# Patient Record
Sex: Male | Born: 1955 | Race: Black or African American | Hispanic: No | Marital: Married | Smoking: Never smoker
Health system: Southern US, Community
[De-identification: ages and names within clinical notes are randomized; demographics above are authoritative.]

## PROBLEM LIST (undated history)

## (undated) ENCOUNTER — Emergency Department (HOSPITAL_COMMUNITY): Admission: EM | Payer: Medicare PPO | Source: Home / Self Care

## (undated) DIAGNOSIS — E559 Vitamin D deficiency, unspecified: Secondary | ICD-10-CM

## (undated) DIAGNOSIS — Z8521 Personal history of malignant neoplasm of larynx: Secondary | ICD-10-CM

## (undated) DIAGNOSIS — E782 Mixed hyperlipidemia: Secondary | ICD-10-CM

## (undated) DIAGNOSIS — R072 Precordial pain: Secondary | ICD-10-CM

## (undated) DIAGNOSIS — K219 Gastro-esophageal reflux disease without esophagitis: Secondary | ICD-10-CM

## (undated) DIAGNOSIS — I2510 Atherosclerotic heart disease of native coronary artery without angina pectoris: Secondary | ICD-10-CM

## (undated) DIAGNOSIS — E034 Atrophy of thyroid (acquired): Secondary | ICD-10-CM

## (undated) DIAGNOSIS — R0602 Shortness of breath: Secondary | ICD-10-CM

## (undated) DIAGNOSIS — E291 Testicular hypofunction: Secondary | ICD-10-CM

## (undated) DIAGNOSIS — Z8501 Personal history of malignant neoplasm of esophagus: Secondary | ICD-10-CM

## (undated) DIAGNOSIS — B1920 Unspecified viral hepatitis C without hepatic coma: Secondary | ICD-10-CM

## (undated) DIAGNOSIS — L910 Hypertrophic scar: Secondary | ICD-10-CM

## (undated) DIAGNOSIS — C153 Malignant neoplasm of upper third of esophagus: Secondary | ICD-10-CM

## (undated) HISTORY — DX: Atrophy of thyroid (acquired): E03.4

## (undated) HISTORY — DX: Atherosclerotic heart disease of native coronary artery without angina pectoris: I25.10

## (undated) HISTORY — DX: Hypertrophic scar: L91.0

## (undated) HISTORY — DX: Testicular hypofunction: E29.1

## (undated) HISTORY — DX: Vitamin D deficiency, unspecified: E55.9

## (undated) HISTORY — DX: Personal history of malignant neoplasm of larynx: Z85.21

## (undated) HISTORY — DX: Mixed hyperlipidemia: E78.2

## (undated) HISTORY — PX: THROAT SURGERY: SHX803

## (undated) HISTORY — DX: Gastro-esophageal reflux disease without esophagitis: K21.9

## (undated) HISTORY — DX: Malignant neoplasm of upper third of esophagus: C15.3

## (undated) HISTORY — PX: PORTACATH PLACEMENT: SHX2246

## (undated) HISTORY — DX: Personal history of malignant neoplasm of esophagus: Z85.01

## (undated) HISTORY — DX: Unspecified viral hepatitis C without hepatic coma: B19.20

## (undated) HISTORY — PX: OTHER SURGICAL HISTORY: SHX169

## (undated) HISTORY — DX: Precordial pain: R07.2

## (undated) HISTORY — DX: Shortness of breath: R06.02

## (undated) HISTORY — PX: GASTROSTOMY W/ FEEDING TUBE: SUR642

---

## 2003-09-30 ENCOUNTER — Emergency Department (HOSPITAL_COMMUNITY): Admission: EM | Admit: 2003-09-30 | Discharge: 2003-09-30 | Payer: Self-pay | Admitting: Family Medicine

## 2003-10-03 ENCOUNTER — Emergency Department (HOSPITAL_COMMUNITY): Admission: EM | Admit: 2003-10-03 | Discharge: 2003-10-03 | Payer: Self-pay | Admitting: Emergency Medicine

## 2003-11-01 ENCOUNTER — Ambulatory Visit: Payer: Self-pay | Admitting: Family Medicine

## 2003-11-05 ENCOUNTER — Encounter: Admission: RE | Admit: 2003-11-05 | Discharge: 2003-11-05 | Payer: Self-pay | Admitting: Family Medicine

## 2003-11-05 ENCOUNTER — Other Ambulatory Visit: Admission: RE | Admit: 2003-11-05 | Discharge: 2003-11-05 | Payer: Self-pay | Admitting: Radiology

## 2005-08-31 ENCOUNTER — Ambulatory Visit (HOSPITAL_COMMUNITY): Admission: RE | Admit: 2005-08-31 | Discharge: 2005-08-31 | Payer: Self-pay | Admitting: General Surgery

## 2012-12-06 DIAGNOSIS — C153 Malignant neoplasm of upper third of esophagus: Secondary | ICD-10-CM

## 2012-12-06 HISTORY — DX: Malignant neoplasm of upper third of esophagus: C15.3

## 2015-06-09 DIAGNOSIS — E291 Testicular hypofunction: Secondary | ICD-10-CM | POA: Diagnosis not present

## 2015-06-09 DIAGNOSIS — E034 Atrophy of thyroid (acquired): Secondary | ICD-10-CM | POA: Diagnosis not present

## 2015-07-24 DIAGNOSIS — J018 Other acute sinusitis: Secondary | ICD-10-CM | POA: Diagnosis not present

## 2015-07-30 DIAGNOSIS — Z8501 Personal history of malignant neoplasm of esophagus: Secondary | ICD-10-CM | POA: Diagnosis not present

## 2015-07-30 DIAGNOSIS — C153 Malignant neoplasm of upper third of esophagus: Secondary | ICD-10-CM | POA: Diagnosis not present

## 2015-07-30 DIAGNOSIS — E079 Disorder of thyroid, unspecified: Secondary | ICD-10-CM | POA: Diagnosis not present

## 2015-08-07 DIAGNOSIS — C153 Malignant neoplasm of upper third of esophagus: Secondary | ICD-10-CM | POA: Diagnosis not present

## 2015-08-20 DIAGNOSIS — C153 Malignant neoplasm of upper third of esophagus: Secondary | ICD-10-CM | POA: Diagnosis not present

## 2015-08-20 DIAGNOSIS — R633 Feeding difficulties: Secondary | ICD-10-CM | POA: Diagnosis not present

## 2015-08-20 DIAGNOSIS — R1310 Dysphagia, unspecified: Secondary | ICD-10-CM | POA: Diagnosis not present

## 2015-08-26 DIAGNOSIS — E039 Hypothyroidism, unspecified: Secondary | ICD-10-CM | POA: Diagnosis not present

## 2015-08-26 DIAGNOSIS — Z431 Encounter for attention to gastrostomy: Secondary | ICD-10-CM | POA: Diagnosis not present

## 2015-08-26 DIAGNOSIS — R1310 Dysphagia, unspecified: Secondary | ICD-10-CM | POA: Diagnosis not present

## 2015-08-26 DIAGNOSIS — K222 Esophageal obstruction: Secondary | ICD-10-CM | POA: Diagnosis not present

## 2015-08-26 DIAGNOSIS — Z8501 Personal history of malignant neoplasm of esophagus: Secondary | ICD-10-CM | POA: Diagnosis not present

## 2015-08-26 DIAGNOSIS — C159 Malignant neoplasm of esophagus, unspecified: Secondary | ICD-10-CM | POA: Diagnosis not present

## 2015-08-26 DIAGNOSIS — Z79899 Other long term (current) drug therapy: Secondary | ICD-10-CM | POA: Diagnosis not present

## 2015-10-23 DIAGNOSIS — R42 Dizziness and giddiness: Secondary | ICD-10-CM | POA: Diagnosis not present

## 2015-10-29 DIAGNOSIS — R0602 Shortness of breath: Secondary | ICD-10-CM | POA: Diagnosis not present

## 2015-10-29 DIAGNOSIS — R42 Dizziness and giddiness: Secondary | ICD-10-CM | POA: Diagnosis not present

## 2015-10-29 DIAGNOSIS — Z8501 Personal history of malignant neoplasm of esophagus: Secondary | ICD-10-CM | POA: Diagnosis not present

## 2015-11-19 DIAGNOSIS — M545 Low back pain: Secondary | ICD-10-CM | POA: Diagnosis not present

## 2015-11-19 DIAGNOSIS — E039 Hypothyroidism, unspecified: Secondary | ICD-10-CM | POA: Diagnosis not present

## 2015-11-19 DIAGNOSIS — E669 Obesity, unspecified: Secondary | ICD-10-CM | POA: Diagnosis not present

## 2015-11-19 DIAGNOSIS — Z6829 Body mass index (BMI) 29.0-29.9, adult: Secondary | ICD-10-CM | POA: Diagnosis not present

## 2015-11-20 DIAGNOSIS — R0602 Shortness of breath: Secondary | ICD-10-CM | POA: Diagnosis not present

## 2015-11-20 DIAGNOSIS — Z8501 Personal history of malignant neoplasm of esophagus: Secondary | ICD-10-CM | POA: Diagnosis not present

## 2015-11-20 DIAGNOSIS — R0609 Other forms of dyspnea: Secondary | ICD-10-CM | POA: Diagnosis not present

## 2015-12-26 DIAGNOSIS — R42 Dizziness and giddiness: Secondary | ICD-10-CM | POA: Diagnosis not present

## 2015-12-26 DIAGNOSIS — Z8521 Personal history of malignant neoplasm of larynx: Secondary | ICD-10-CM | POA: Insufficient documentation

## 2015-12-26 DIAGNOSIS — R0602 Shortness of breath: Secondary | ICD-10-CM | POA: Diagnosis not present

## 2015-12-26 DIAGNOSIS — R0600 Dyspnea, unspecified: Secondary | ICD-10-CM | POA: Insufficient documentation

## 2015-12-26 DIAGNOSIS — R072 Precordial pain: Secondary | ICD-10-CM | POA: Diagnosis not present

## 2015-12-26 DIAGNOSIS — R0609 Other forms of dyspnea: Secondary | ICD-10-CM | POA: Insufficient documentation

## 2015-12-26 HISTORY — DX: Personal history of malignant neoplasm of larynx: Z85.21

## 2015-12-26 HISTORY — DX: Shortness of breath: R06.02

## 2015-12-26 HISTORY — DX: Precordial pain: R07.2

## 2016-01-15 DIAGNOSIS — Z8521 Personal history of malignant neoplasm of larynx: Secondary | ICD-10-CM | POA: Diagnosis not present

## 2016-01-15 DIAGNOSIS — R072 Precordial pain: Secondary | ICD-10-CM | POA: Diagnosis not present

## 2016-01-15 DIAGNOSIS — R0602 Shortness of breath: Secondary | ICD-10-CM | POA: Diagnosis not present

## 2016-02-05 DIAGNOSIS — Z8501 Personal history of malignant neoplasm of esophagus: Secondary | ICD-10-CM | POA: Diagnosis not present

## 2016-02-05 DIAGNOSIS — J9811 Atelectasis: Secondary | ICD-10-CM | POA: Diagnosis not present

## 2016-02-05 DIAGNOSIS — C153 Malignant neoplasm of upper third of esophagus: Secondary | ICD-10-CM | POA: Diagnosis not present

## 2016-02-05 DIAGNOSIS — R05 Cough: Secondary | ICD-10-CM | POA: Diagnosis not present

## 2016-03-02 DIAGNOSIS — R0602 Shortness of breath: Secondary | ICD-10-CM | POA: Diagnosis not present

## 2016-03-02 DIAGNOSIS — R072 Precordial pain: Secondary | ICD-10-CM | POA: Diagnosis not present

## 2016-03-02 DIAGNOSIS — Z8521 Personal history of malignant neoplasm of larynx: Secondary | ICD-10-CM | POA: Diagnosis not present

## 2016-03-10 DIAGNOSIS — Z0000 Encounter for general adult medical examination without abnormal findings: Secondary | ICD-10-CM

## 2016-03-10 DIAGNOSIS — Z452 Encounter for adjustment and management of vascular access device: Secondary | ICD-10-CM | POA: Diagnosis not present

## 2016-03-10 HISTORY — DX: Encounter for adjustment and management of vascular access device: Z45.2

## 2016-03-10 HISTORY — DX: Encounter for general adult medical examination without abnormal findings: Z00.00

## 2016-04-28 DIAGNOSIS — Z452 Encounter for adjustment and management of vascular access device: Secondary | ICD-10-CM | POA: Diagnosis not present

## 2016-05-03 DIAGNOSIS — K219 Gastro-esophageal reflux disease without esophagitis: Secondary | ICD-10-CM | POA: Diagnosis not present

## 2016-05-03 DIAGNOSIS — Z8501 Personal history of malignant neoplasm of esophagus: Secondary | ICD-10-CM | POA: Diagnosis not present

## 2016-05-03 DIAGNOSIS — Z79899 Other long term (current) drug therapy: Secondary | ICD-10-CM | POA: Diagnosis not present

## 2016-05-03 DIAGNOSIS — Z452 Encounter for adjustment and management of vascular access device: Secondary | ICD-10-CM | POA: Diagnosis not present

## 2016-05-03 DIAGNOSIS — E039 Hypothyroidism, unspecified: Secondary | ICD-10-CM | POA: Diagnosis not present

## 2016-05-12 DIAGNOSIS — R05 Cough: Secondary | ICD-10-CM | POA: Diagnosis not present

## 2016-05-12 DIAGNOSIS — E663 Overweight: Secondary | ICD-10-CM | POA: Diagnosis not present

## 2016-05-12 DIAGNOSIS — Z7982 Long term (current) use of aspirin: Secondary | ICD-10-CM | POA: Diagnosis not present

## 2016-05-12 DIAGNOSIS — I209 Angina pectoris, unspecified: Secondary | ICD-10-CM | POA: Diagnosis not present

## 2016-05-12 DIAGNOSIS — M545 Low back pain: Secondary | ICD-10-CM | POA: Diagnosis not present

## 2016-05-12 DIAGNOSIS — E039 Hypothyroidism, unspecified: Secondary | ICD-10-CM | POA: Diagnosis not present

## 2016-05-12 DIAGNOSIS — Z8501 Personal history of malignant neoplasm of esophagus: Secondary | ICD-10-CM | POA: Diagnosis not present

## 2016-05-12 DIAGNOSIS — E785 Hyperlipidemia, unspecified: Secondary | ICD-10-CM | POA: Diagnosis not present

## 2016-05-12 DIAGNOSIS — Z6828 Body mass index (BMI) 28.0-28.9, adult: Secondary | ICD-10-CM | POA: Diagnosis not present

## 2016-05-20 DIAGNOSIS — R350 Frequency of micturition: Secondary | ICD-10-CM | POA: Diagnosis not present

## 2016-05-20 DIAGNOSIS — C159 Malignant neoplasm of esophagus, unspecified: Secondary | ICD-10-CM | POA: Diagnosis not present

## 2016-05-20 DIAGNOSIS — E559 Vitamin D deficiency, unspecified: Secondary | ICD-10-CM | POA: Diagnosis not present

## 2016-05-20 DIAGNOSIS — E034 Atrophy of thyroid (acquired): Secondary | ICD-10-CM | POA: Diagnosis not present

## 2016-05-26 DIAGNOSIS — Z452 Encounter for adjustment and management of vascular access device: Secondary | ICD-10-CM | POA: Diagnosis not present

## 2016-06-28 DIAGNOSIS — C159 Malignant neoplasm of esophagus, unspecified: Secondary | ICD-10-CM | POA: Diagnosis not present

## 2016-06-28 DIAGNOSIS — R1310 Dysphagia, unspecified: Secondary | ICD-10-CM | POA: Diagnosis not present

## 2016-06-28 DIAGNOSIS — K222 Esophageal obstruction: Secondary | ICD-10-CM | POA: Diagnosis not present

## 2016-06-30 DIAGNOSIS — R072 Precordial pain: Secondary | ICD-10-CM | POA: Diagnosis not present

## 2016-06-30 DIAGNOSIS — R0602 Shortness of breath: Secondary | ICD-10-CM | POA: Diagnosis not present

## 2016-06-30 DIAGNOSIS — Z8521 Personal history of malignant neoplasm of larynx: Secondary | ICD-10-CM | POA: Diagnosis not present

## 2016-07-02 DIAGNOSIS — Z7982 Long term (current) use of aspirin: Secondary | ICD-10-CM | POA: Diagnosis not present

## 2016-07-02 DIAGNOSIS — R1310 Dysphagia, unspecified: Secondary | ICD-10-CM | POA: Diagnosis not present

## 2016-07-02 DIAGNOSIS — Z8501 Personal history of malignant neoplasm of esophagus: Secondary | ICD-10-CM | POA: Diagnosis not present

## 2016-07-02 DIAGNOSIS — Z86010 Personal history of colonic polyps: Secondary | ICD-10-CM | POA: Diagnosis not present

## 2016-07-02 DIAGNOSIS — E079 Disorder of thyroid, unspecified: Secondary | ICD-10-CM | POA: Diagnosis not present

## 2016-07-02 DIAGNOSIS — C159 Malignant neoplasm of esophagus, unspecified: Secondary | ICD-10-CM | POA: Diagnosis not present

## 2016-07-02 DIAGNOSIS — Z79899 Other long term (current) drug therapy: Secondary | ICD-10-CM | POA: Diagnosis not present

## 2016-08-05 DIAGNOSIS — C153 Malignant neoplasm of upper third of esophagus: Secondary | ICD-10-CM | POA: Diagnosis not present

## 2016-08-05 DIAGNOSIS — Z8501 Personal history of malignant neoplasm of esophagus: Secondary | ICD-10-CM | POA: Diagnosis not present

## 2016-09-29 DIAGNOSIS — L910 Hypertrophic scar: Secondary | ICD-10-CM | POA: Diagnosis not present

## 2016-09-29 HISTORY — DX: Hypertrophic scar: L91.0

## 2016-10-26 DIAGNOSIS — L732 Hidradenitis suppurativa: Secondary | ICD-10-CM | POA: Diagnosis not present

## 2016-11-24 DIAGNOSIS — K219 Gastro-esophageal reflux disease without esophagitis: Secondary | ICD-10-CM | POA: Diagnosis not present

## 2016-11-24 DIAGNOSIS — Z23 Encounter for immunization: Secondary | ICD-10-CM | POA: Diagnosis not present

## 2016-11-24 DIAGNOSIS — E034 Atrophy of thyroid (acquired): Secondary | ICD-10-CM | POA: Diagnosis not present

## 2016-11-24 DIAGNOSIS — E291 Testicular hypofunction: Secondary | ICD-10-CM | POA: Diagnosis not present

## 2016-12-22 DIAGNOSIS — I10 Essential (primary) hypertension: Secondary | ICD-10-CM | POA: Diagnosis not present

## 2017-04-11 ENCOUNTER — Encounter: Payer: Self-pay | Admitting: *Deleted

## 2017-04-11 ENCOUNTER — Other Ambulatory Visit: Payer: Self-pay | Admitting: *Deleted

## 2017-04-15 DIAGNOSIS — D649 Anemia, unspecified: Secondary | ICD-10-CM | POA: Diagnosis not present

## 2017-04-15 DIAGNOSIS — Z8501 Personal history of malignant neoplasm of esophagus: Secondary | ICD-10-CM | POA: Diagnosis not present

## 2017-04-15 DIAGNOSIS — Z923 Personal history of irradiation: Secondary | ICD-10-CM | POA: Diagnosis not present

## 2017-04-15 DIAGNOSIS — Z9221 Personal history of antineoplastic chemotherapy: Secondary | ICD-10-CM | POA: Diagnosis not present

## 2017-04-20 ENCOUNTER — Encounter: Payer: Self-pay | Admitting: Cardiology

## 2017-04-20 ENCOUNTER — Ambulatory Visit: Payer: Medicare PPO | Admitting: Cardiology

## 2017-04-20 VITALS — BP 150/80 | HR 72 | Resp 12 | Ht 71.0 in | Wt 222.0 lb

## 2017-04-20 DIAGNOSIS — R072 Precordial pain: Secondary | ICD-10-CM

## 2017-04-20 DIAGNOSIS — R0602 Shortness of breath: Secondary | ICD-10-CM | POA: Diagnosis not present

## 2017-04-20 DIAGNOSIS — Z8521 Personal history of malignant neoplasm of larynx: Secondary | ICD-10-CM | POA: Diagnosis not present

## 2017-04-20 NOTE — Progress Notes (Signed)
Cardiology Office Note:    Date:  04/20/2017   ID:  Mark Mejia, DOB 11-13-1955, MRN [id number]  PCP:  Darrol Jump, PA-C  Cardiologist:  Jenne Campus, MD    Referring MD: Darrol Jump, PA-C   Chief Complaint  Patient presents with  . Follow-up  Having neck pain  History of Present Illness:    Mark Mejia is a 62 y.o. male with history of exertional chest pain.  Stress test was done which was normal however I put him on antianginal therapy and he seems to responding very well to it.  Denies having any chest pain tightness squeezing pressure burning chest since the beginning he did have quite a dramatic response to medications however now we have new problem he complained of having some numbness in the right side of his head and many times he complained of having tightness around his jaw.  That typically last for a few seconds not related to exercise.  There is no shortness of breath associated with this there is no chest pain associated with it.  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  He was told that he may have a problem with carotid artery and I think it would be reasonable to check a carotid artery with carotid ultrasound.  Described to have some exertional shortness of breath but nothing extraordinary I still think it would be reasonable to perform echocardiogram to check his left ventricular ejection fraction.  Past Medical History:  Diagnosis Date  . 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 Hypertrophic scar of skin 09/29/2016  . Precordial chest pain 12/26/2015  . Shortness of breath 12/26/2015      Current Medications: Current Meds  Medication Sig  . amLODipine (NORVASC) 5 MG tablet Take 1 tablet by mouth daily.  Marland Kitchen aspirin EC 81 MG tablet Take 81 mg by mouth daily.   Marland Kitchen atorvastatin (LIPITOR) 10 MG tablet Take 10 mg by mouth daily at 6 PM.   . cyclobenzaprine (FLEXERIL) 10 MG  tablet Take 10 mg by mouth 3 (three) times daily as needed.   . finasteride (PROSCAR) 5 MG tablet Take 1 tablet by mouth daily.  Marland Kitchen HYDROcodone-acetaminophen (NORCO/VICODIN) 5-325 MG tablet Take 1 tablet by mouth every 8 (eight) hours as needed.  Marland Kitchen levothyroxine (SYNTHROID, LEVOTHROID) 50 MCG tablet Take 1 tablet by mouth daily.  . meloxicam (MOBIC) 7.5 MG tablet Take 1 tablet by mouth daily.  . nitroGLYCERIN (NITROSTAT) 0.4 MG SL tablet Place 1 tablet under the tongue as needed.  . ranolazine (RANEXA) 1000 MG SR tablet Take 500 mg by mouth 2 (two) times daily.  . Vitamin D, Ergocalciferol, (DRISDOL) 50000 units CAPS capsule Take 1 capsule by mouth once a week.     Allergies:   Patient has no known allergies.   Social History   Socioeconomic History  . Marital status: Married    Spouse name: None  . Number of children: None  . Years of education: None  . Highest education level: None  Social Needs  . Financial resource strain: None  . Food insecurity - worry: None  . Food insecurity - inability: None  . Transportation needs - medical: None  . Transportation needs - non-medical: None  Occupational History  . None  Tobacco Use  . Smoking status: Former Research scientist (life sciences)  . Smokeless tobacco: Never Used  Substance and Sexual Activity  . Alcohol use: No    Frequency:  Never  . Drug use: No  . Sexual activity: None  Other Topics Concern  . None  Social History Narrative  . None     Family History: The patient's family history includes Arthritis in his mother; Hypertension in his mother. ROS:   Please see the history of present illness.    All 14 point review of systems negative except as described per history of present illness  EKGs/Labs/Other Studies Reviewed:      Recent Labs: No results found for requested labs within last 8760 hours.  Recent Lipid Panel No results found for: CHOL, TRIG, HDL, CHOLHDL, VLDL, LDLCALC, LDLDIRECT  Physical Exam:    VS:  BP (!) 150/80   Pulse  72   Resp 12   Ht 5\' 11"  (1.803 m)   Wt 222 lb (100.7 kg)   BMI 30.96 kg/m     Wt Readings from Last 3 Encounters:  04/20/17 222 lb (100.7 kg)     GEN:  Well nourished, well developed in no acute distress HEENT: Normal NECK: No JVD; No carotid bruits LYMPHATICS: No lymphadenopathy CARDIAC: RRR, no murmurs, no rubs, no gallops RESPIRATORY:  Clear to auscultation without rales, wheezing or rhonchi  ABDOMEN: Soft, non-tender, non-distended MUSCULOSKELETAL:  No edema; No deformity  SKIN: Warm and dry LOWER EXTREMITIES: no swelling NEUROLOGIC:  Alert and oriented x 3 PSYCHIATRIC:  Normal affect   ASSESSMENT:    1. History of laryngeal cancer   2. Precordial chest pain   3. Shortness of breath    PLAN:    In order of problems listed above:  1. Precordial chest pain.  Well controlled with medications.  Now she he does have neck pain.  However atypical not related to exercise lasting only for seconds.  I will ask him to have carotid ultrasound.  Echocardiogram will be done as well. 2. History of laryngeal cancer: Followed by oncology team. 3. Shortness of breath: Multifactorial we will get echocardiogram.   Medication Adjustments/Labs and Tests Ordered: Current medicines are reviewed at length with the patient today.  Concerns regarding medicines are outlined above.  No orders of the defined types were placed in this encounter.  Medication changes: No orders of the defined types were placed in this encounter.   Signed, Park Liter, MD, [facility] 04/20/2017 10:23 AM    [place]

## 2017-04-20 NOTE — Patient Instructions (Signed)
Medication Instructions:  Your physician recommends that you continue on your current medications as directed. Please refer to the Current Medication list given to you today.  Labwork: None ordered  Testing/Procedures: Your physician has requested that you have an echocardiogram. Echocardiography is a painless test that uses sound waves to create images of your heart. It provides your doctor with information about the size and shape of your heart and how well your heart's chambers and valves are working. This procedure takes approximately one hour. There are no restrictions for this procedure.  Your physician has requested that you have a carotid duplex. This test is an ultrasound of the carotid arteries in your neck. It looks at blood flow through these arteries that supply the brain with blood. Allow one hour for this exam. There are no restrictions or special instructions.  EKG Today  Follow-Up: Your physician recommends that you schedule a follow-up appointment in: 1 month with Dr. Agustin Cree   Any Other Special Instructions Will Be Listed Below (If Applicable).     If you need a refill on your cardiac medications before your next appointment, please call your pharmacy.

## 2017-05-19 ENCOUNTER — Ambulatory Visit (HOSPITAL_BASED_OUTPATIENT_CLINIC_OR_DEPARTMENT_OTHER)
Admission: RE | Admit: 2017-05-19 | Discharge: 2017-05-19 | Disposition: A | Discharge status: Home / Self Care | Payer: Medicare PPO | Source: Ambulatory Visit | Attending: Cardiology | Admitting: Cardiology

## 2017-05-19 DIAGNOSIS — I517 Cardiomegaly: Secondary | ICD-10-CM | POA: Diagnosis not present

## 2017-05-19 DIAGNOSIS — Z923 Personal history of irradiation: Secondary | ICD-10-CM | POA: Insufficient documentation

## 2017-05-19 DIAGNOSIS — I340 Nonrheumatic mitral (valve) insufficiency: Secondary | ICD-10-CM | POA: Diagnosis not present

## 2017-05-19 DIAGNOSIS — R0602 Shortness of breath: Secondary | ICD-10-CM

## 2017-05-19 DIAGNOSIS — Z8521 Personal history of malignant neoplasm of larynx: Secondary | ICD-10-CM | POA: Insufficient documentation

## 2017-05-19 DIAGNOSIS — R072 Precordial pain: Secondary | ICD-10-CM | POA: Diagnosis not present

## 2017-05-19 NOTE — Progress Notes (Signed)
*  PRELIMINARY RESULTS*  Complete Carotid duplex has been performed, Bilateral ICA stenosis was observed.  Joelene Millin 05/19/2017, 12:28 PM

## 2017-05-19 NOTE — Progress Notes (Signed)
Echocardiogram 2D Echocardiogram has been performed.  Mark Mejia 05/19/2017, 12:31 PM

## 2017-05-23 ENCOUNTER — Encounter: Payer: Self-pay | Admitting: Cardiology

## 2017-05-23 ENCOUNTER — Ambulatory Visit (HOSPITAL_BASED_OUTPATIENT_CLINIC_OR_DEPARTMENT_OTHER)
Admission: RE | Admit: 2017-05-23 | Discharge: 2017-05-23 | Disposition: A | Discharge status: Home / Self Care | Payer: Medicare PPO | Source: Ambulatory Visit | Attending: Cardiology | Admitting: Cardiology

## 2017-05-23 ENCOUNTER — Ambulatory Visit: Payer: Medicare PPO | Admitting: Cardiology

## 2017-05-23 VITALS — BP 134/70 | HR 68 | Ht 71.0 in | Wt 219.8 lb

## 2017-05-23 DIAGNOSIS — R072 Precordial pain: Secondary | ICD-10-CM | POA: Diagnosis not present

## 2017-05-23 DIAGNOSIS — R0602 Shortness of breath: Secondary | ICD-10-CM | POA: Insufficient documentation

## 2017-05-23 DIAGNOSIS — Z8521 Personal history of malignant neoplasm of larynx: Secondary | ICD-10-CM | POA: Diagnosis not present

## 2017-05-23 DIAGNOSIS — Z01818 Encounter for other preprocedural examination: Secondary | ICD-10-CM | POA: Insufficient documentation

## 2017-05-23 DIAGNOSIS — R079 Chest pain, unspecified: Secondary | ICD-10-CM | POA: Diagnosis not present

## 2017-05-23 DIAGNOSIS — I739 Peripheral vascular disease, unspecified: Secondary | ICD-10-CM | POA: Insufficient documentation

## 2017-05-23 DIAGNOSIS — R109 Unspecified abdominal pain: Secondary | ICD-10-CM | POA: Diagnosis present

## 2017-05-23 HISTORY — DX: Peripheral vascular disease, unspecified: I73.9

## 2017-05-23 MED ORDER — ROSUVASTATIN CALCIUM 20 MG PO TABS: 20.0000 mg | ORAL_TABLET | Freq: Every day | ORAL | 3 refills | Status: DC

## 2017-05-23 MED ORDER — OMEPRAZOLE 40 MG PO CPDR: 40.0000 mg | DELAYED_RELEASE_CAPSULE | Freq: Every day | ORAL | 0 refills | Status: DC

## 2017-05-23 NOTE — H&P (View-Only) (Signed)
Cardiology Office Note:    Date:  05/23/2017   ID:  Mark Mejia, DOB 15-Nov-1955, MRN [id number]  PCP:  Darrol Jump, PA-C  Cardiologist:  Jenne Campus, MD    Referring MD: Darrol Jump, PA-C   Chief Complaint  Patient presents with  . 1 month follow up  I am having a lot of heartburn  History of Present Illness:    Mark Mejia is a 62 y.o. male with history of laryngeal cancer.  He also got history of chest pain.  He did have a stress test which was negative however he was still complaining of having exertional chest pain.  I put him on ranolazine with excellent response.  He became asymptomatic.  I did also carotid ultrasounds and I find out that he gets significant stenosis of left internal coronary artery.  He comes today to my office to talk about potential intervention that he needs for that.  However, he started talking to me about heartburn that he gets while walking.  It is predictable when he walks he will develop chest tightness he stops pain goes away.  Ranolazine it is not helping anymore.  He burps a lot.  He takes some over-the-counter antacids with no relief. Decision is quite difficult.  I think the best option for him will be to proceed with cardiac catheterization to clarify make sure he does not have any significant coronary artery disease.  His symptoms include exertional chest pain as well as exertional shortness of breath.  Now seems to be also have a problem with his carotid artery and potential intervention is being contemplated I think we must make sure that there is no problem with his heart.  I explained to him procedure including all risk benefits as well as alternatives.  He is willing to proceed that way.  Today I will put him on omeprazole I will also check his troponin as well as routine pre-cardiac cath orders.  Also when we will do cardiac catheterization we may do also at the same time carotid angiogram if needed.  And after that if cardiac  catheterization is negative he may be referred for vascular surgery for repair of his artery.  Past Medical History:  Diagnosis Date  . 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 Hypertrophic scar of skin 09/29/2016  . Precordial chest pain 12/26/2015  . Shortness of breath 12/26/2015      Current Medications: Current Meds  Medication Sig  . amLODipine (NORVASC) 5 MG tablet Take 1 tablet by mouth daily.  Marland Kitchen aspirin EC 81 MG tablet Take 81 mg by mouth daily.   . cyclobenzaprine (FLEXERIL) 10 MG tablet Take 10 mg by mouth 3 (three) times daily as needed.   Marland Kitchen HYDROcodone-acetaminophen (NORCO/VICODIN) 5-325 MG tablet Take 1 tablet by mouth every 8 (eight) hours as needed.  Marland Kitchen levothyroxine (SYNTHROID, LEVOTHROID) 50 MCG tablet Take 1 tablet by mouth daily.  . meloxicam (MOBIC) 7.5 MG tablet Take 1 tablet by mouth daily.  . nitroGLYCERIN (NITROSTAT) 0.4 MG SL tablet Place 1 tablet under the tongue as needed.  . ranolazine (RANEXA) 1000 MG SR tablet Take 500 mg by mouth 2 (two) times daily.  . Vitamin D, Ergocalciferol, (DRISDOL) 50000 units CAPS capsule Take 1 capsule by mouth once a week.     Allergies:   Patient has no known allergies.   Social History   Socioeconomic History  . Marital status: Married  Spouse name: Not on file  . Number of children: Not on file  . Years of education: Not on file  . Highest education level: Not on file  Occupational History  . Not on file  Social Needs  . Financial resource strain: Not on file  . Food insecurity:    Worry: Not on file    Inability: Not on file  . Transportation needs:    Medical: Not on file    Non-medical: Not on file  Tobacco Use  . Smoking status: Former Research scientist (life sciences)  . Smokeless tobacco: Never Used  Substance and Sexual Activity  . Alcohol use: No    Frequency: Never  . Drug use: No  . Sexual activity: Not on file  Lifestyle  . Physical activity:    Days per week: Not on  file    Minutes per session: Not on file  . Stress: Not on file  Relationships  . Social connections:    Talks on phone: Not on file    Gets together: Not on file    Attends religious service: Not on file    Active member of club or organization: Not on file    Attends meetings of clubs or organizations: Not on file    Relationship status: Not on file  Other Topics Concern  . Not on file  Social History Narrative  . Not on file     Family History: The patient's family history includes Arthritis in his mother; Hypertension in his mother. ROS:   Please see the history of present illness.    All 14 point review of systems negative except as described per history of present illness  EKGs/Labs/Other Studies Reviewed:      Recent Labs: No results found for requested labs within last 8760 hours.  Recent Lipid Panel No results found for: CHOL, TRIG, HDL, CHOLHDL, VLDL, LDLCALC, LDLDIRECT  Physical Exam:    VS:  BP 134/70   Pulse 68   Ht 5\' 11"  (1.803 m)   Wt 219 lb 12.8 oz (99.7 kg)   SpO2 99%   BMI 30.66 kg/m     Wt Readings from Last 3 Encounters:  05/23/17 219 lb 12.8 oz (99.7 kg)  04/20/17 222 lb (100.7 kg)     GEN:  Well nourished, well developed in no acute distress HEENT: Normal NECK: No JVD; No carotid bruits LYMPHATICS: No lymphadenopathy CARDIAC: RRR, no murmurs, no rubs, no gallops RESPIRATORY:  Clear to auscultation without rales, wheezing or rhonchi  ABDOMEN: Soft, non-tender, non-distended MUSCULOSKELETAL:  No edema; No deformity  SKIN: Warm and dry LOWER EXTREMITIES: no swelling NEUROLOGIC:  Alert and oriented x 3 PSYCHIATRIC:  Normal affect   ASSESSMENT:    1. Precordial chest pain   2. Shortness of breath   3. History of laryngeal cancer   4. Peripheral vascular disease, asymptomatic ([place])    PLAN:    In order of problems listed above:  1. Precordial chest pain: Plan as outlined above we will show him to have cardiac catheterization  procedure explained including all risk benefits as well as alternatives. 2. Carotid arterial disease with significant stenosis in the left side.  After cardiac catheterization will be done the plan will be made to intervene on his neck. 3. History of laryngeal cancer: Apparently stable. 4. Shortness of breath: Will schedule him to have a cardiac catheterization   Medication Adjustments/Labs and Tests Ordered: Current medicines are reviewed at length with the patient today.  Concerns regarding medicines are  outlined above.  No orders of the defined types were placed in this encounter.  Medication changes: No orders of the defined types were placed in this encounter.   Signed, Park Liter, MD, [facility] 05/23/2017 10:59 AM    [place]

## 2017-05-23 NOTE — Progress Notes (Signed)
Cardiology Office Note:    Date:  05/23/2017   ID:  Mark Mejia, DOB 02/16/1956, MRN [id number]  PCP:  Darrol Jump, PA-C  Cardiologist:  Jenne Campus, MD    Referring MD: Darrol Jump, PA-C   Chief Complaint  Patient presents with  . 1 month follow up  I am having a lot of heartburn  History of Present Illness:    Mark Mejia is a 62 y.o. male with history of laryngeal cancer.  He also got history of chest pain.  He did have a stress test which was negative however he was still complaining of having exertional chest pain.  I put him on ranolazine with excellent response.  He became asymptomatic.  I did also carotid ultrasounds and I find out that he gets significant stenosis of left internal coronary artery.  He comes today to my office to talk about potential intervention that he needs for that.  However, he started talking to me about heartburn that he gets while walking.  It is predictable when he walks he will develop chest tightness he stops pain goes away.  Ranolazine it is not helping anymore.  He burps a lot.  He takes some over-the-counter antacids with no relief. Decision is quite difficult.  I think the best option for him will be to proceed with cardiac catheterization to clarify make sure he does not have any significant coronary artery disease.  His symptoms include exertional chest pain as well as exertional shortness of breath.  Now seems to be also have a problem with his carotid artery and potential intervention is being contemplated I think we must make sure that there is no problem with his heart.  I explained to him procedure including all risk benefits as well as alternatives.  He is willing to proceed that way.  Today I will put him on omeprazole I will also check his troponin as well as routine pre-cardiac cath orders.  Also when we will do cardiac catheterization we may do also at the same time carotid angiogram if needed.  And after that if cardiac  catheterization is negative he may be referred for vascular surgery for repair of his artery.  Past Medical History:  Diagnosis Date  . 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 Hypertrophic scar of skin 09/29/2016  . Precordial chest pain 12/26/2015  . Shortness of breath 12/26/2015      Current Medications: Current Meds  Medication Sig  . amLODipine (NORVASC) 5 MG tablet Take 1 tablet by mouth daily.  Marland Kitchen aspirin EC 81 MG tablet Take 81 mg by mouth daily.   . cyclobenzaprine (FLEXERIL) 10 MG tablet Take 10 mg by mouth 3 (three) times daily as needed.   Marland Kitchen HYDROcodone-acetaminophen (NORCO/VICODIN) 5-325 MG tablet Take 1 tablet by mouth every 8 (eight) hours as needed.  Marland Kitchen levothyroxine (SYNTHROID, LEVOTHROID) 50 MCG tablet Take 1 tablet by mouth daily.  . meloxicam (MOBIC) 7.5 MG tablet Take 1 tablet by mouth daily.  . nitroGLYCERIN (NITROSTAT) 0.4 MG SL tablet Place 1 tablet under the tongue as needed.  . ranolazine (RANEXA) 1000 MG SR tablet Take 500 mg by mouth 2 (two) times daily.  . Vitamin D, Ergocalciferol, (DRISDOL) 50000 units CAPS capsule Take 1 capsule by mouth once a week.     Allergies:   Patient has no known allergies.   Social History   Socioeconomic History  . Marital status: Married  Spouse name: Not on file  . Number of children: Not on file  . Years of education: Not on file  . Highest education level: Not on file  Occupational History  . Not on file  Social Needs  . Financial resource strain: Not on file  . Food insecurity:    Worry: Not on file    Inability: Not on file  . Transportation needs:    Medical: Not on file    Non-medical: Not on file  Tobacco Use  . Smoking status: Former Research scientist (life sciences)  . Smokeless tobacco: Never Used  Substance and Sexual Activity  . Alcohol use: No    Frequency: Never  . Drug use: No  . Sexual activity: Not on file  Lifestyle  . Physical activity:    Days per week: Not on  file    Minutes per session: Not on file  . Stress: Not on file  Relationships  . Social connections:    Talks on phone: Not on file    Gets together: Not on file    Attends religious service: Not on file    Active member of club or organization: Not on file    Attends meetings of clubs or organizations: Not on file    Relationship status: Not on file  Other Topics Concern  . Not on file  Social History Narrative  . Not on file     Family History: The patient's family history includes Arthritis in his mother; Hypertension in his mother. ROS:   Please see the history of present illness.    All 14 point review of systems negative except as described per history of present illness  EKGs/Labs/Other Studies Reviewed:      Recent Labs: No results found for requested labs within last 8760 hours.  Recent Lipid Panel No results found for: CHOL, TRIG, HDL, CHOLHDL, VLDL, LDLCALC, LDLDIRECT  Physical Exam:    VS:  BP 134/70   Pulse 68   Ht 5\' 11"  (1.803 m)   Wt 219 lb 12.8 oz (99.7 kg)   SpO2 99%   BMI 30.66 kg/m     Wt Readings from Last 3 Encounters:  05/23/17 219 lb 12.8 oz (99.7 kg)  04/20/17 222 lb (100.7 kg)     GEN:  Well nourished, well developed in no acute distress HEENT: Normal NECK: No JVD; No carotid bruits LYMPHATICS: No lymphadenopathy CARDIAC: RRR, no murmurs, no rubs, no gallops RESPIRATORY:  Clear to auscultation without rales, wheezing or rhonchi  ABDOMEN: Soft, non-tender, non-distended MUSCULOSKELETAL:  No edema; No deformity  SKIN: Warm and dry LOWER EXTREMITIES: no swelling NEUROLOGIC:  Alert and oriented x 3 PSYCHIATRIC:  Normal affect   ASSESSMENT:    1. Precordial chest pain   2. Shortness of breath   3. History of laryngeal cancer   4. Peripheral vascular disease, asymptomatic ([place])    PLAN:    In order of problems listed above:  1. Precordial chest pain: Plan as outlined above we will show him to have cardiac catheterization  procedure explained including all risk benefits as well as alternatives. 2. Carotid arterial disease with significant stenosis in the left side.  After cardiac catheterization will be done the plan will be made to intervene on his neck. 3. History of laryngeal cancer: Apparently stable. 4. Shortness of breath: Will schedule him to have a cardiac catheterization   Medication Adjustments/Labs and Tests Ordered: Current medicines are reviewed at length with the patient today.  Concerns regarding medicines are  outlined above.  No orders of the defined types were placed in this encounter.  Medication changes: No orders of the defined types were placed in this encounter.   Signed, Park Liter, MD, [facility] 05/23/2017 10:59 AM    [place]

## 2017-05-23 NOTE — Patient Instructions (Signed)
Medication Instructions:  Your physician has recommended you make the following change in your medication:  START omeprazole 40 mg daily  Labwork: Your physician recommends that you have the following labs drawn: BMP, CBC, INR, and troponin  Testing/Procedures: A chest x-ray takes a picture of the organs and structures inside the chest, including the heart, lungs, and blood vessels. This test can show several things, including, whether the heart is enlarges; whether fluid is building up in the lungs; and whether pacemaker / defibrillator leads are still in place.    [facility] [place] [address], [place] [address] Dept: [phone] Loc: [place]  05/23/2017  You are scheduled for a Cardiac Catheterization on Wednesday, March 27 with Dr. Peter Martinique.  1. Please arrive at the [facility] (Main Entrance A) at [facility]: [address] [place], [place] [address] at 12:30 PM (two hours before your procedure to ensure your preparation). Free valet parking service is available.   Special note: Every effort is made to have your procedure done on time. Please understand that emergencies sometimes delay scheduled procedures.  2. Diet: You may have a clear liquid breakfast, but nothing after 8 AM on your procedure day.  3. Labs: Done on 03/25.  4. Medication instructions in preparation for your procedure:   On the morning of your procedure, take your Aspirin and any morning medicines NOT listed above.  You may use sips of water.  5. Plan for one night stay--bring personal belongings. 6. Bring a current list of your medications and current insurance cards. 7. You MUST have a responsible person to drive you home. 8. Someone MUST be with you the first 24 hours after you arrive home or your discharge will be delayed. 9. Please wear clothes that are easy to get on and  off and wear slip-on shoes.  Thank you for allowing [place] to care for you!   -- [facility]   Follow-Up: Your physician recommends that you schedule a follow-up appointment in: 1 month  Any Other Special Instructions Will Be Listed Below (If Applicable).     If you need a refill on your cardiac medications before your next appointment, please call your pharmacy.   [place], RN, BSN   Coronary Angiogram With Stent Coronary angiogram with stent placement is a procedure to widen or open a narrow blood vessel of the heart (coronary artery). Arteries may become blocked by cholesterol buildup (plaques) in the lining of the wall. When a coronary artery becomes partially blocked, blood flow to that area decreases. This may lead to chest pain or a heart attack (myocardial infarction). A stent is a small piece of metal that looks like mesh or a spring. Stent placement may be done as treatment for a heart attack or right after a coronary angiogram in which a blocked artery is found. Let your health care provider know about:  Any allergies you have.  All medicines you are taking, including vitamins, herbs, eye drops, creams, and over-the-counter medicines.  Any problems you or family members have had with anesthetic medicines.  Any blood disorders you have.  Any surgeries you have had.  Any medical conditions you have.  Whether you are pregnant or may be pregnant. What are the risks? Generally, this is a safe procedure. However, problems may occur, including:  Damage to the heart or its  blood vessels.  A return of blockage.  Bleeding, infection, or bruising at the insertion site.  A collection of blood under the skin (hematoma) at the insertion site.  A blood clot in another part of the body.  Kidney injury.  Allergic reaction to the dye or contrast that is used.  Bleeding into the abdomen (retroperitoneal bleeding).  What  happens before the procedure? Staying hydrated Follow instructions from your health care provider about hydration, which may include:  Up to 2 hours before the procedure - you may continue to drink clear liquids, such as water, clear fruit juice, black coffee, and plain tea.  Eating and drinking restrictions Follow instructions from your health care provider about eating and drinking, which may include:  8 hours before the procedure - stop eating heavy meals or foods such as meat, fried foods, or fatty foods.  6 hours before the procedure - stop eating light meals or foods, such as toast or cereal.  2 hours before the procedure - stop drinking clear liquids.  Ask your health care provider about:  Changing or stopping your regular medicines. This is especially important if you are taking diabetes medicines or blood thinners.  Taking medicines such as ibuprofen. These medicines can thin your blood. Do not take these medicines before your procedure if your health care provider instructs you not to. Generally, aspirin is recommended before a procedure of passing a small, thin tube (catheter) through a blood vessel and into the heart (cardiac catheterization).  What happens during the procedure?  An IV tube will be inserted into one of your veins.  You will be given one or more of the following: ? A medicine to help you relax (sedative). ? A medicine to numb the area where the catheter will be inserted into an artery (local anesthetic).  To reduce your risk of infection: ? Your health care team will wash or sanitize their hands. ? Your skin will be washed with soap. ? Hair may be removed from the area where the catheter will be inserted.  Using a guide wire, the catheter will be inserted into an artery. The location may be in your groin, in your wrist, or in the fold of your arm (near your elbow).  A type of X-ray (fluoroscopy) will be used to help guide the catheter to the opening of  the arteries in the heart.  A dye will be injected into the catheter, and X-rays will be taken. The dye will help to show where any narrowing or blockages are located in the arteries.  A tiny wire will be guided to the blocked spot, and a balloon will be inflated to make the artery wider.  The stent will be expanded and will crush the plaques into the wall of the vessel. The stent will hold the area open and improve the blood flow. Most stents have a drug coating to reduce the risk of the stent narrowing over time.  The artery may be made wider using a drill, laser, or other tools to remove plaques.  When the blood flow is better, the catheter will be removed. The lining of the artery will grow over the stent, which stays where it was placed. This procedure may vary among health care providers and hospitals. What happens after the procedure?  If the procedure is done through the leg, you will be kept in bed lying flat for about 6 hours. You will be instructed to not bend and not cross your legs.  The insertion site will be checked frequently.  The pulse in your foot or wrist will be checked frequently.  You may have additional blood tests, X-rays, and a test that records the electrical activity of your heart (electrocardiogram, or ECG). This information is not intended to replace advice given to you by your health care provider. Make sure you discuss any questions you have with your health care provider. Document Released: 08/22/2002 Document Revised: 10/16/2015 Document Reviewed: 09/21/2015 Elsevier Interactive Patient Education  [employer].

## 2017-05-24 ENCOUNTER — Telehealth: Payer: Self-pay | Admitting: *Deleted

## 2017-05-24 DIAGNOSIS — I2510 Atherosclerotic heart disease of native coronary artery without angina pectoris: Secondary | ICD-10-CM | POA: Diagnosis not present

## 2017-05-24 DIAGNOSIS — E034 Atrophy of thyroid (acquired): Secondary | ICD-10-CM | POA: Diagnosis not present

## 2017-05-24 DIAGNOSIS — I119 Hypertensive heart disease without heart failure: Secondary | ICD-10-CM | POA: Diagnosis not present

## 2017-05-24 DIAGNOSIS — K219 Gastro-esophageal reflux disease without esophagitis: Secondary | ICD-10-CM | POA: Diagnosis not present

## 2017-05-24 LAB — BASIC METABOLIC PANEL
BUN/Creatinine Ratio: 13 (ref 10–24)
BUN: 12 mg/dL (ref 8–27)
CALCIUM: 9.9 mg/dL (ref 8.6–10.2)
CHLORIDE: 105 mmol/L (ref 96–106)
CO2: 21 mmol/L (ref 20–29)
CREATININE: 0.94 mg/dL (ref 0.76–1.27)
GFR, EST AFRICAN AMERICAN: 99 mL/min/{1.73_m2} (ref >59)
GFR, EST NON AFRICAN AMERICAN: 86 mL/min/{1.73_m2} (ref >59)
Glucose: 83 mg/dL (ref 65–99)
Potassium: 3.9 mmol/L (ref 3.5–5.2)
Sodium: 142 mmol/L (ref 134–144)

## 2017-05-24 LAB — CBC WITH DIFFERENTIAL/PLATELET
BASOS: 0 %
Basophils Absolute: 0 10*3/uL (ref 0.0–0.2)
EOS (ABSOLUTE): 0.1 10*3/uL (ref 0.0–0.4)
EOS: 3 %
HEMATOCRIT: 38.4 % (ref 37.5–51.0)
HEMOGLOBIN: 13.5 g/dL (ref 13.0–17.7)
IMMATURE GRANS (ABS): 0 10*3/uL (ref 0.0–0.1)
Immature Granulocytes: 0 %
LYMPHS: 48 %
Lymphocytes Absolute: 2.2 10*3/uL (ref 0.7–3.1)
MCH: 28.6 pg (ref 26.6–33.0)
MCHC: 35.2 g/dL (ref 31.5–35.7)
MCV: 81 fL (ref 79–97)
MONOCYTES: 7 %
Monocytes Absolute: 0.3 10*3/uL (ref 0.1–0.9)
Neutrophils Absolute: 1.9 10*3/uL (ref 1.4–7.0)
Neutrophils: 42 %
PLATELETS: 159 10*3/uL (ref 150–379)
RBC: 4.72 x10E6/uL (ref 4.14–5.80)
RDW: 15.6 % — ABNORMAL HIGH (ref 12.3–15.4)
WBC: 4.5 10*3/uL (ref 3.4–10.8)

## 2017-05-24 LAB — PROTIME-INR
INR: 1.1 (ref 0.8–1.2)
Prothrombin Time: 11.4 s (ref 9.1–12.0)

## 2017-05-24 LAB — TROPONIN I: Troponin I: <0.01 ng/mL (ref 0.00–0.04)

## 2017-05-24 NOTE — Telephone Encounter (Signed)
Pt contacted pre-catheterization scheduled at [facility] for: Wednesday May 25, 2017 2:30 PM Verified arrival time and place: [facility] Main Entrance A/[facility] at: 12:30 PM  Nothing to eat after midnight prior to cath, clear liquids until 7:30 AM then nothing to eat or drink except sips of water with medications Verified no diabetes medications   AM meds can be  taken pre-cath with sip of water including: ASA 81 mg  Confirmed patient has responsible person to drive home post procedure and observe patient for 24 hours: yes

## 2017-05-25 ENCOUNTER — Ambulatory Visit (HOSPITAL_COMMUNITY)
Admission: RE | Disposition: A | Discharge status: Home / Self Care | Payer: Self-pay | Source: Ambulatory Visit | Attending: Cardiology

## 2017-05-25 ENCOUNTER — Ambulatory Visit (HOSPITAL_COMMUNITY)
Admission: RE | Admit: 2017-05-25 | Discharge: 2017-05-25 | Disposition: A | Discharge status: Home / Self Care | Payer: Medicare PPO | Source: Ambulatory Visit | Attending: Cardiology | Admitting: Cardiology

## 2017-05-25 DIAGNOSIS — Z8521 Personal history of malignant neoplasm of larynx: Secondary | ICD-10-CM | POA: Diagnosis not present

## 2017-05-25 DIAGNOSIS — R0602 Shortness of breath: Secondary | ICD-10-CM | POA: Diagnosis not present

## 2017-05-25 DIAGNOSIS — Z87891 Personal history of nicotine dependence: Secondary | ICD-10-CM | POA: Diagnosis not present

## 2017-05-25 DIAGNOSIS — Z7982 Long term (current) use of aspirin: Secondary | ICD-10-CM | POA: Insufficient documentation

## 2017-05-25 DIAGNOSIS — R0789 Other chest pain: Secondary | ICD-10-CM | POA: Diagnosis present

## 2017-05-25 DIAGNOSIS — Z79899 Other long term (current) drug therapy: Secondary | ICD-10-CM | POA: Insufficient documentation

## 2017-05-25 DIAGNOSIS — I209 Angina pectoris, unspecified: Secondary | ICD-10-CM

## 2017-05-25 DIAGNOSIS — Z8501 Personal history of malignant neoplasm of esophagus: Secondary | ICD-10-CM | POA: Diagnosis not present

## 2017-05-25 DIAGNOSIS — R072 Precordial pain: Secondary | ICD-10-CM | POA: Insufficient documentation

## 2017-05-25 DIAGNOSIS — I739 Peripheral vascular disease, unspecified: Secondary | ICD-10-CM | POA: Diagnosis present

## 2017-05-25 DIAGNOSIS — Z8249 Family history of ischemic heart disease and other diseases of the circulatory system: Secondary | ICD-10-CM | POA: Diagnosis not present

## 2017-05-25 DIAGNOSIS — R079 Chest pain, unspecified: Secondary | ICD-10-CM | POA: Diagnosis present

## 2017-05-25 DIAGNOSIS — Z79890 Hormone replacement therapy (postmenopausal): Secondary | ICD-10-CM | POA: Diagnosis not present

## 2017-05-25 HISTORY — PX: LEFT HEART CATH AND CORONARY ANGIOGRAPHY: CATH118249

## 2017-05-25 HISTORY — DX: Angina pectoris, unspecified: I20.9

## 2017-05-25 HISTORY — DX: Chest pain, unspecified: R07.9

## 2017-05-25 SURGERY — LEFT HEART CATH AND CORONARY ANGIOGRAPHY
Anesthesia: LOCAL

## 2017-05-25 MED ORDER — FENTANYL CITRATE (PF) 100 MCG/2ML IJ SOLN
INTRAMUSCULAR | Status: DC | PRN
  Administered 2017-05-25: 25 ug via INTRAVENOUS

## 2017-05-25 MED ORDER — SODIUM CHLORIDE 0.9 % IV SOLN
250.0000 mL | INTRAVENOUS | Status: DC | PRN
Start: 2017-05-25 — End: 2017-05-25

## 2017-05-25 MED ORDER — SODIUM CHLORIDE 0.9% FLUSH: 3.0000 mL | INTRAVENOUS | Status: DC | PRN

## 2017-05-25 MED ORDER — ONDANSETRON HCL 4 MG/2ML IJ SOLN: 4.0000 mg | Freq: Four times a day (QID) | INTRAMUSCULAR | Status: DC | PRN

## 2017-05-25 MED ORDER — LIDOCAINE HCL (PF) 1 % IJ SOLN
INTRAMUSCULAR | Status: AC
  Filled 2017-05-25: qty 30

## 2017-05-25 MED ORDER — FENTANYL CITRATE (PF) 100 MCG/2ML IJ SOLN
INTRAMUSCULAR | Status: AC
  Filled 2017-05-25: qty 2

## 2017-05-25 MED ORDER — VERAPAMIL HCL 2.5 MG/ML IV SOLN
INTRAVENOUS | Status: DC | PRN
  Administered 2017-05-25: 10 mL via INTRA_ARTERIAL

## 2017-05-25 MED ORDER — SODIUM CHLORIDE 0.9 % WEIGHT BASED INFUSION
3.0000 mL/kg/h | INTRAVENOUS | Status: AC
  Administered 2017-05-25: 3 mL/kg/h via INTRAVENOUS

## 2017-05-25 MED ORDER — IOPAMIDOL (ISOVUE-370) INJECTION 76%
INTRAVENOUS | Status: DC | PRN
  Administered 2017-05-25: 45 mL via INTRA_ARTERIAL

## 2017-05-25 MED ORDER — HEPARIN SODIUM (PORCINE) 1000 UNIT/ML IJ SOLN
INTRAMUSCULAR | Status: DC | PRN
  Administered 2017-05-25: 5000 [IU] via INTRAVENOUS

## 2017-05-25 MED ORDER — SODIUM CHLORIDE 0.9% FLUSH: 3.0000 mL | Freq: Two times a day (BID) | INTRAVENOUS | Status: DC

## 2017-05-25 MED ORDER — ASPIRIN 81 MG PO CHEW: 81.0000 mg | CHEWABLE_TABLET | ORAL | Status: DC

## 2017-05-25 MED ORDER — SODIUM CHLORIDE 0.9 % WEIGHT BASED INFUSION: 1.0000 mL/kg/h | INTRAVENOUS | Status: DC

## 2017-05-25 MED ORDER — HEPARIN (PORCINE) IN NACL 2-0.9 UNIT/ML-% IJ SOLN
INTRAMUSCULAR | Status: AC
  Filled 2017-05-25: qty 1000

## 2017-05-25 MED ORDER — IOPAMIDOL (ISOVUE-370) INJECTION 76%
INTRAVENOUS | Status: AC
  Filled 2017-05-25: qty 100

## 2017-05-25 MED ORDER — LIDOCAINE HCL (PF) 1 % IJ SOLN
INTRAMUSCULAR | Status: DC | PRN
  Administered 2017-05-25: 1 mL via INTRADERMAL

## 2017-05-25 MED ORDER — HEPARIN SODIUM (PORCINE) 1000 UNIT/ML IJ SOLN
INTRAMUSCULAR | Status: AC
  Filled 2017-05-25: qty 1

## 2017-05-25 MED ORDER — ACETAMINOPHEN 325 MG PO TABS: 650.0000 mg | ORAL_TABLET | ORAL | Status: DC | PRN

## 2017-05-25 MED ORDER — MIDAZOLAM HCL 2 MG/2ML IJ SOLN
INTRAMUSCULAR | Status: DC | PRN
  Administered 2017-05-25: 1 mg via INTRAVENOUS

## 2017-05-25 MED ORDER — MIDAZOLAM HCL 2 MG/2ML IJ SOLN
INTRAMUSCULAR | Status: AC
  Filled 2017-05-25: qty 2

## 2017-05-25 MED ORDER — VERAPAMIL HCL 2.5 MG/ML IV SOLN
INTRAVENOUS | Status: AC
  Filled 2017-05-25: qty 2

## 2017-05-25 MED ORDER — HEPARIN (PORCINE) IN NACL 2-0.9 UNIT/ML-% IJ SOLN
INTRAMUSCULAR | Status: AC | PRN
  Administered 2017-05-25 (×2): 500 mL

## 2017-05-25 MED ORDER — SODIUM CHLORIDE 0.9 % WEIGHT BASED INFUSION: 1.0000 mL/kg/h | INTRAVENOUS | Status: AC

## 2017-05-25 MED ORDER — SODIUM CHLORIDE 0.9 % IV SOLN: 250.0000 mL | INTRAVENOUS | Status: DC | PRN

## 2017-05-25 SURGICAL SUPPLY — 13 items
BAND CMPR LRG ZPHR (HEMOSTASIS) ×1
BAND ZEPHYR COMPRESS 30 LONG (HEMOSTASIS) ×1 IMPLANT
CATH INFINITI 5 FR JL3.5 (CATHETERS) ×1 IMPLANT
CATH INFINITI JR4 5F (CATHETERS) ×1 IMPLANT
GUIDEWIRE INQWIRE 1.5J.035X260 (WIRE) IMPLANT
INQWIRE 1.5J .035X260CM (WIRE) ×2
KIT HEART LEFT (KITS) ×2 IMPLANT
NDL PERC 21GX4CM (NEEDLE) IMPLANT
NEEDLE PERC 21GX4CM (NEEDLE) ×2 IMPLANT
PACK CARDIAC CATHETERIZATION (CUSTOM PROCEDURE TRAY) ×2 IMPLANT
SHEATH RAIN RADIAL 21G 6FR (SHEATH) ×1 IMPLANT
TRANSDUCER W/STOPCOCK (MISCELLANEOUS) ×2 IMPLANT
TUBING CIL FLEX 10 FLL-RA (TUBING) ×2 IMPLANT

## 2017-05-25 NOTE — Interval H&P Note (Signed)
History and Physical Interval Note:  05/25/2017 1:52 PM  Mark Mejia  has presented today for surgery, with the diagnosis of cp  The various methods of treatment have been discussed with the patient and family. After consideration of risks, benefits and other options for treatment, the patient has consented to  Procedure(s): LEFT HEART CATH AND CORONARY ANGIOGRAPHY (N/A) as a surgical intervention .  The patient's history has been reviewed, patient examined, no change in status, stable for surgery.  I have reviewed the patient's chart and labs.  Questions were answered to the patient's satisfaction.    Cath Lab Visit (complete for each Cath Lab visit)  Clinical Evaluation Leading to the Procedure:   ACS: No.  Non-ACS:    Anginal Classification: CCS III  Anti-ischemic medical therapy: Minimal Therapy (1 class of medications)  Non-Invasive Test Results: No non-invasive testing performed  Prior CABG: No previous CABG       Mark Mejia [facility] 05/25/2017 1:52 PM

## 2017-05-25 NOTE — Discharge Instructions (Signed)
**Note Costella Schwarz-identified via Obfuscation** Radial Site Care °Refer to this sheet in the next few weeks. These instructions provide you with information about caring for yourself after your procedure. Your health care provider may also give you more specific instructions. Your treatment has been planned according to current medical practices, but problems sometimes occur. Call your health care provider if you have any problems or questions after your procedure. °What can I expect after the procedure? °After your procedure, it is typical to have the following: °· Bruising at the radial site that usually fades within 1-2 weeks. °· Blood collecting in the tissue (hematoma) that may be painful to the touch. It should usually decrease in size and tenderness within 1-2 weeks. ° °Follow these instructions at home: °· Take medicines only as directed by your health care provider. °· You may shower 24-48 hours after the procedure or as directed by your health care provider. Remove the bandage (dressing) and gently wash the site with plain soap and water. Pat the area dry with a clean towel. Do not rub the site, because this may cause bleeding. °· Do not take baths, swim, or use a hot tub until your health care provider approves. °· Check your insertion site every day for redness, swelling, or drainage. °· Do not apply powder or lotion to the site. °· Do not flex or bend the affected arm for 24 hours or as directed by your health care provider. °· Do not push or pull heavy objects with the affected arm for 24 hours or as directed by your health care provider. °· Do not lift over 10 lb (4.5 kg) for 5 days after your procedure or as directed by your health care provider. °· Ask your health care provider when it is okay to: °? Return to work or school. °? Resume usual physical activities or sports. °? Resume sexual activity. °· Do not drive home if you are discharged the same day as the procedure. Have someone else drive you. °· You may drive 24 hours after the procedure  unless otherwise instructed by your health care provider. °· Do not operate machinery or power tools for 24 hours after the procedure. °· If your procedure was done as an outpatient procedure, which means that you went home the same day as your procedure, a responsible adult should be with you for the first 24 hours after you arrive home. °· Keep all follow-up visits as directed by your health care provider. This is important. °Contact a health care provider if: °· You have a fever. °· You have chills. °· You have increased bleeding from the radial site. Hold pressure on the site. °Get help right away if: °· You have unusual pain at the radial site. °· You have redness, warmth, or swelling at the radial site. °· You have drainage (other than a small amount of blood on the dressing) from the radial site. °· The radial site is bleeding, and the bleeding does not stop after 30 minutes of holding steady pressure on the site. °· Your arm or hand becomes pale, cool, tingly, or numb. °This information is not intended to replace advice given to you by your health care provider. Make sure you discuss any questions you have with your health care provider. °Document Released: 03/20/2010 Document Revised: 07/24/2015 Document Reviewed: 09/03/2013 °Elsevier Interactive Patient Education © 2018 Elsevier Inc. ° °

## 2017-05-25 NOTE — Research (Signed)
CADFEM Informed Consent   Subject Name: Mark Mejia  Subject met inclusion and exclusion criteria.  The informed consent form, study requirements and expectations were reviewed with the subject and questions and concerns were addressed prior to the signing of the consent form.  The subject verbalized understanding of the trail requirements.  The subject agreed to participate in the CADFEM trial and signed the informed consent.  The informed consent was obtained prior to performance of any protocol-specific procedures for the subject.  A copy of the signed informed consent was given to the subject and a copy was placed in the subject's medical record.  Christena Flake 05/25/2017, 01:01 PM

## 2017-05-26 ENCOUNTER — Encounter (HOSPITAL_COMMUNITY): Payer: Self-pay | Admitting: Cardiology

## 2017-05-26 MED FILL — Heparin Sodium (Porcine) 2 Unit/ML in Sodium Chloride 0.9%: INTRAMUSCULAR | Qty: 1000 | Status: AC

## 2017-06-23 ENCOUNTER — Ambulatory Visit: Payer: Medicare PPO | Admitting: Cardiology

## 2017-06-23 ENCOUNTER — Encounter: Payer: Self-pay | Admitting: Cardiology

## 2017-06-23 VITALS — BP 118/68 | HR 65 | Ht 71.0 in | Wt 217.8 lb

## 2017-06-23 DIAGNOSIS — Z8521 Personal history of malignant neoplasm of larynx: Secondary | ICD-10-CM

## 2017-06-23 DIAGNOSIS — R072 Precordial pain: Secondary | ICD-10-CM

## 2017-06-23 DIAGNOSIS — I739 Peripheral vascular disease, unspecified: Secondary | ICD-10-CM

## 2017-06-23 DIAGNOSIS — R0602 Shortness of breath: Secondary | ICD-10-CM

## 2017-06-23 MED ORDER — FUROSEMIDE 20 MG PO TABS: 20.0000 mg | ORAL_TABLET | Freq: Every day | ORAL | 3 refills | Status: DC

## 2017-06-23 NOTE — Progress Notes (Signed)
Cardiology Office Note:    Date:  06/23/2017   ID:  Mark Mejia, DOB [birth year], MRN [id number]  PCP:  Darrol Jump, PA-C  Cardiologist:  Jenne Campus, MD    Referring MD: Darrol Jump, PA-C   Chief Complaint  Patient presents with  . 1 month follow up  I had cardiac catheterization  History of Present Illness:    Mark Mejia is a 62 y.o. male with peripheral vascular disease.  He does have significant carotid arterial disease as a part of evaluation he had cardiac catheterization because of exertional shortness of breath and chest tightness.  Luckily cardiac catheterization revealed normal coronaries.  He does have mildly elevated LVEDP.  He is doing well he said yesterday all day he was able to work in the garden was very tired and exhausted after that but still did not have any chest pain tightness squeezing pressure burning chest.  Past Medical History:  Diagnosis Date  . 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 Hypertrophic scar of skin 09/29/2016  . Precordial chest pain 12/26/2015  . Shortness of breath 12/26/2015      Current Medications: Current Meds  Medication Sig  . amLODipine (NORVASC) 5 MG tablet Take 5 mg by mouth daily.   Marland Kitchen aspirin EC 81 MG tablet Take 81 mg by mouth daily.   . cyclobenzaprine (FLEXERIL) 10 MG tablet Take 10 mg by mouth 3 (three) times daily as needed for muscle spasms.   . Glycerin-Hypromellose-PEG 400 ([facility] DRY EYE RELIEF OP) Place 2 drops into both eyes daily as needed (for dry eyes).  Marland Kitchen guaiFENesin-codeine (ROBITUSSIN AC) 100-10 MG/5ML syrup Take 5-10 mLs by mouth every 6 (six) hours as needed for cough.  Marland Kitchen HYDROcodone-acetaminophen (NORCO/VICODIN) 5-325 MG tablet Take 1 tablet by mouth every 6 (six) hours as needed for moderate pain.   Marland Kitchen levothyroxine (SYNTHROID, LEVOTHROID) 50 MCG tablet Take 50 mcg by mouth daily before breakfast.   . meloxicam (MOBIC) 7.5 MG tablet Take 7.5 mg  by mouth 2 (two) times daily.   . nitroGLYCERIN (NITROSTAT) 0.4 MG SL tablet Place 0.4 mg under the tongue every 5 (five) minutes as needed for chest pain.   Marland Kitchen omeprazole (PRILOSEC) 40 MG capsule Take 1 capsule (40 mg total) by mouth daily.  . ranolazine (RANEXA) 500 MG 12 hr tablet Take 500 mg by mouth 2 (two) times daily.  . rosuvastatin (CRESTOR) 20 MG tablet Take 1 tablet (20 mg total) by mouth daily.  . Tetrahydroz-Polyvinyl Al-Povid (MURINE TEARS PLUS) 0.05-0.5-0.6 % SOLN Place 2 drops into both eyes daily as needed (for dry eyes).  . Vitamin D, Ergocalciferol, (DRISDOL) 50000 units CAPS capsule Take 50,000 Units by mouth every Sunday.      Allergies:   Patient has no known allergies.   Social History   Socioeconomic History  . Marital status: Married    Spouse name: Not on file  . Number of children: Not on file  . Years of education: Not on file  . Highest education level: Not on file  Occupational History  . Not on file  Social Needs  . Financial resource strain: Not on file  . Food insecurity:    Worry: Not on file    Inability: Not on file  . Transportation needs:    Medical: Not on file    Non-medical: Not on file  Tobacco Use  . Smoking status: Former Research scientist (life sciences)  . Smokeless tobacco: Never Used  Substance and Sexual Activity  . Alcohol use: No    Frequency: Never  . Drug use: No  . Sexual activity: Not on file  Lifestyle  . Physical activity:    Days per week: Not on file    Minutes per session: Not on file  . Stress: Not on file  Relationships  . Social connections:    Talks on phone: Not on file    Gets together: Not on file    Attends religious service: Not on file    Active member of club or organization: Not on file    Attends meetings of clubs or organizations: Not on file    Relationship status: Not on file  Other Topics Concern  . Not on file  Social History Narrative  . Not on file     Family History: The patient's family history includes  Arthritis in his mother; Hypertension in his mother. ROS:   Please see the history of present illness.    All 14 point review of systems negative except as described per history of present illness  EKGs/Labs/Other Studies Reviewed:      Recent Labs: 05/23/2017: BUN 12; Creatinine, Ser 0.94; Hemoglobin 13.5; Platelets 159; Potassium 3.9; Sodium 142  Recent Lipid Panel No results found for: CHOL, TRIG, HDL, CHOLHDL, VLDL, LDLCALC, LDLDIRECT  Physical Exam:    VS:  BP 118/68   Pulse 65   Ht 5\' 11"  (1.803 m)   Wt 217 lb 12.8 oz (98.8 kg)   SpO2 98%   BMI 30.38 kg/m     Wt Readings from Last 3 Encounters:  06/23/17 217 lb 12.8 oz (98.8 kg)  05/25/17 215 lb (97.5 kg)  05/23/17 219 lb 12.8 oz (99.7 kg)     GEN:  Well nourished, well developed in no acute distress HEENT: Normal NECK: No JVD; No carotid bruits LYMPHATICS: No lymphadenopathy CARDIAC: RRR, no murmurs, no rubs, no gallops RESPIRATORY:  Clear to auscultation without rales, wheezing or rhonchi  ABDOMEN: Soft, non-tender, non-distended MUSCULOSKELETAL:  No edema; No deformity  SKIN: Warm and dry LOWER EXTREMITIES: no swelling NEUROLOGIC:  Alert and oriented x 3 PSYCHIATRIC:  Normal affect   ASSESSMENT:    1. Precordial chest pain   2. Shortness of breath   3. History of laryngeal cancer   4. Peripheral vascular disease, asymptomatic ([place])    PLAN:    In order of problems listed above:  1. Precordial chest pain: Catheterization revealed normal coronaries.  Will discontinue his ranolazine. 2. Peripheral vascular disease: He will be referred to vascular surgery for consideration of carotid endarterectomy. 3. He still for laryngeal cancer apparently stable. 4. Dyspnea on exertion: His LVEDP was mildly elevated will try to give him small dose of diuretic.  I will give him 20 mg of furosemide to see if he would feel any better.  He became upset about 3 months or sooner if he is a problem   Medication  Adjustments/Labs and Tests Ordered: Current medicines are reviewed at length with the patient today.  Concerns regarding medicines are outlined above.  No orders of the defined types were placed in this encounter.  Medication changes: No orders of the defined types were placed in this encounter.   Signed, Park Liter, MD, [facility] 06/23/2017 10:32 AM    [place]

## 2017-06-23 NOTE — Addendum Note (Signed)
Addended by: Austin Miles on: 06/23/2017 10:43 AM   Modules accepted: Orders

## 2017-06-23 NOTE — Patient Instructions (Signed)
Medication Instructions:  Your physician has recommended you make the following change in your medication:  STOP ranolazine (ranexa) START furosemide (lasix) 20 mg daily   Labwork: None  Testing/Procedures: None  Follow-Up: Your physician wants you to follow-up in: 3 months. You will receive a reminder letter in the mail two months in advance. If you don't receive a letter, please call our office to schedule the follow-up appointment.   Any Other Special Instructions Will Be Listed Below (If Applicable).  You have been referred to vascular surgery. You will be contacted to schedule an appointment.    If you need a refill on your cardiac medications before your next appointment, please call your pharmacy.

## 2017-06-28 ENCOUNTER — Other Ambulatory Visit: Payer: Self-pay

## 2017-06-28 DIAGNOSIS — I739 Peripheral vascular disease, unspecified: Secondary | ICD-10-CM

## 2017-07-28 ENCOUNTER — Encounter (HOSPITAL_COMMUNITY): Payer: Medicare PPO

## 2017-07-28 ENCOUNTER — Encounter: Payer: Medicare PPO | Admitting: Vascular Surgery

## 2017-08-25 ENCOUNTER — Encounter: Payer: Medicare PPO | Admitting: Vascular Surgery

## 2017-08-25 ENCOUNTER — Encounter (HOSPITAL_COMMUNITY): Payer: Medicare PPO

## 2017-08-31 ENCOUNTER — Ambulatory Visit: Payer: Medicare PPO | Admitting: Vascular Surgery

## 2017-08-31 ENCOUNTER — Other Ambulatory Visit: Payer: Self-pay | Admitting: Vascular Surgery

## 2017-08-31 ENCOUNTER — Encounter (HOSPITAL_COMMUNITY): Payer: Medicare PPO

## 2017-08-31 ENCOUNTER — Ambulatory Visit (HOSPITAL_COMMUNITY)
Admission: RE | Admit: 2017-08-31 | Discharge: 2017-08-31 | Disposition: A | Discharge status: Home / Self Care | Payer: Medicare PPO | Source: Ambulatory Visit | Attending: Vascular Surgery | Admitting: Vascular Surgery

## 2017-08-31 ENCOUNTER — Encounter: Payer: Self-pay | Admitting: Vascular Surgery

## 2017-08-31 ENCOUNTER — Encounter: Payer: Medicare PPO | Admitting: Vascular Surgery

## 2017-08-31 ENCOUNTER — Other Ambulatory Visit: Payer: Self-pay

## 2017-08-31 VITALS — BP 131/80 | HR 65 | Resp 20 | Ht 71.0 in | Wt 226.9 lb

## 2017-08-31 DIAGNOSIS — I6529 Occlusion and stenosis of unspecified carotid artery: Secondary | ICD-10-CM | POA: Diagnosis not present

## 2017-08-31 DIAGNOSIS — I6523 Occlusion and stenosis of bilateral carotid arteries: Secondary | ICD-10-CM

## 2017-08-31 DIAGNOSIS — I739 Peripheral vascular disease, unspecified: Secondary | ICD-10-CM

## 2017-08-31 NOTE — Progress Notes (Signed)
Patient ID: CIRE CLUTE, male   DOB: 03-May-1955, 62 y.o.   MRN: [id number]  Reason for Consult: New Patient (Initial Visit) (eval PVD - Dr. Agustin Cree )   Referred by Darrol Jump, PA-C  Subjective:     HPI:  SKYE RODARTE is a 62 y.o. male with history of cervical esophageal cancer.  He underwent chemoradiation as well as surgery for this.  He is also a former smoker has hypertension and hyperlipidemia.  He recently had evaluation of his carotid arteries and is now sent for further evaluation after being found to have blockages bilaterally left greater than right.  He denies any stroke TIA or amaurosis.  He walks without issue.  He is able to eat without issue.  His chief complaint is really having spasms of his neck right greater than left after having had radiation.  He is otherwise active.  Does take aspirin and a statin.    Past Medical History:  Diagnosis Date  . Cancer of cervical esophagus ([place]) 12/06/2012  . 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 Hypertrophic scar of skin 09/29/2016  . Precordial chest pain 12/26/2015  . Shortness of breath 12/26/2015   Family History  Problem Relation Age of Onset  . Arthritis Mother   . Hypertension Mother    Past Surgical History:  Procedure Laterality Date  . GASTROSTOMY W/ FEEDING TUBE    . LEFT HEART CATH AND CORONARY ANGIOGRAPHY N/A 05/25/2017   Procedure: LEFT HEART CATH AND CORONARY ANGIOGRAPHY;  Surgeon: Martinique, Peter M, MD;  Location: [facility];  Service: Cardiovascular;  Laterality: N/A;  . PORTACATH PLACEMENT    . THROAT SURGERY      Short Social History:  Social History   Tobacco Use  . Smoking status: Former Research scientist (life sciences)  . Smokeless tobacco: Never Used  Substance Use Topics  . Alcohol use: No    Frequency: Never    No Known Allergies  Current Outpatient Medications  Medication Sig Dispense Refill  . amLODipine (NORVASC) 5 MG tablet Take 5 mg by mouth daily.   2  . aspirin EC 81 MG tablet Take 81 mg  by mouth daily.     . cyclobenzaprine (FLEXERIL) 10 MG tablet Take 10 mg by mouth 3 (three) times daily as needed for muscle spasms.     . furosemide (LASIX) 20 MG tablet Take 1 tablet (20 mg total) by mouth daily. 30 tablet 3  . Glycerin-Hypromellose-PEG 400 ([facility] DRY EYE RELIEF OP) Place 2 drops into both eyes daily as needed (for dry eyes).    Marland Kitchen guaiFENesin-codeine (ROBITUSSIN AC) 100-10 MG/5ML syrup Take 5-10 mLs by mouth every 6 (six) hours as needed for cough.    Marland Kitchen HYDROcodone-acetaminophen (NORCO/VICODIN) 5-325 MG tablet Take 1 tablet by mouth every 6 (six) hours as needed for moderate pain.     Marland Kitchen levothyroxine (SYNTHROID, LEVOTHROID) 50 MCG tablet Take 50 mcg by mouth daily before breakfast.     . meloxicam (MOBIC) 7.5 MG tablet Take 7.5 mg by mouth 2 (two) times daily.     . nitroGLYCERIN (NITROSTAT) 0.4 MG SL tablet Place 0.4 mg under the tongue every 5 (five) minutes as needed for chest pain.     Marland Kitchen omeprazole (PRILOSEC) 40 MG capsule Take 1 capsule (40 mg total) by mouth daily. 90 capsule 0  . rosuvastatin (CRESTOR) 20 MG tablet Take 1 tablet (20 mg total) by mouth daily. 90 tablet 3  . Tetrahydroz-Polyvinyl Al-Povid (MURINE TEARS PLUS) 0.05-0.5-0.6 %  SOLN Place 2 drops into both eyes daily as needed (for dry eyes).    . Vitamin D, Ergocalciferol, (DRISDOL) 50000 units CAPS capsule Take 50,000 Units by mouth every Sunday.      No current facility-administered medications for this visit.     Review of Systems  Constitutional:  Constitutional negative. HENT: HENT negative.  Eyes: Eyes negative.  Respiratory: Respiratory negative.  Cardiovascular: Cardiovascular negative.  GI: Gastrointestinal negative.  GU: Positive for frequency.  Musculoskeletal:       Neck spasm Skin: Skin negative.  Neurological: Neurological negative. Hematologic: Hematologic/lymphatic negative.  Psychiatric: Psychiatric negative.        Objective:  Objective   Vitals:   08/31/17 1109 08/31/17 1111    BP: 113/77 131/80  Pulse: 65   Resp: 20   SpO2: 98%   Weight: 226 lb 14.4 oz (102.9 kg)   Height: 5\' 11" (1.803 m)    Body mass index is 31.65 kg/m.  Physical Exam  Constitutional: He is oriented to person, place, and time. He appears well-developed.  HENT:  Head: Normocephalic.  Eyes: Pupils are equal, round, and reactive to light.  Neck:  Neck range of motion is normal, there are mild skin changes from previous radiation  Cardiovascular: Normal rate.  Pulses:      Carotid pulses are 2+ on the right side, and 2+ on the left side.      Radial pulses are 2+ on the right side, and 2+ on the left side.       Popliteal pulses are 2+ on the right side, and 2+ on the left side.  Pulmonary/Chest: Effort normal.  Abdominal: Soft.  Musculoskeletal: Normal range of motion.  Neurological: He is alert and oriented to person, place, and time.  Skin: Skin is warm.  Psychiatric: He has a normal mood and affect. His behavior is normal. Judgment and thought content normal.    Data: I have independently interpreted his carotid duplex which demonstrates heterogeneous 40 to 59% stenosis on the right and on the left homogeneous 60 to 79% stenosis.     Assessment/Plan:     62  year old male presents for evaluation carotid artery disease.  We have somewhat incongruent assessments from the previous duplex in our performed in the office today given this and with his history of radiation as well as calcific shadowing on previous ultrasound I have offered him a CT Angio of his neck as a tie breaker study that could possibly lead to surgery or stenting in the near future should he really have 80% blockage.  At this time he would rather wait and get another ultrasound in 6 months.  If he changes his mind I told him we could set him up with CT angios and to see me in the next 4 weeks or so.  There is a physician in the family that he wants to discuss this with.  I did discuss with him the signs and symptoms  of stroke he and his wife who seem very intelligent demonstrated very good understanding and would seek emergent medical attention in this circumstance.  He will remain on aspirin and statin and I will plan to see him in 6 months.     Waynetta Sandy MD [facility]

## 2017-09-05 ENCOUNTER — Other Ambulatory Visit: Payer: Self-pay

## 2017-09-05 DIAGNOSIS — I6523 Occlusion and stenosis of bilateral carotid arteries: Secondary | ICD-10-CM

## 2017-10-18 DIAGNOSIS — Z9221 Personal history of antineoplastic chemotherapy: Secondary | ICD-10-CM | POA: Diagnosis not present

## 2017-10-18 DIAGNOSIS — Z8501 Personal history of malignant neoplasm of esophagus: Secondary | ICD-10-CM | POA: Diagnosis not present

## 2017-10-18 DIAGNOSIS — Z923 Personal history of irradiation: Secondary | ICD-10-CM | POA: Diagnosis not present

## 2017-10-21 ENCOUNTER — Other Ambulatory Visit: Payer: Self-pay | Admitting: Cardiology

## 2017-10-21 ENCOUNTER — Other Ambulatory Visit: Payer: Self-pay | Admitting: Emergency Medicine

## 2017-10-21 MED ORDER — OMEPRAZOLE 40 MG PO CPDR: 40.0000 mg | DELAYED_RELEASE_CAPSULE | Freq: Every day | ORAL | 1 refills | Status: DC

## 2017-10-21 NOTE — Telephone Encounter (Signed)
Wants omeprazole called to [facility]-not [facility]

## 2017-10-21 NOTE — Telephone Encounter (Signed)
Patient informed that refill was sent to [facility]

## 2017-11-09 ENCOUNTER — Ambulatory Visit (INDEPENDENT_AMBULATORY_CARE_PROVIDER_SITE_OTHER): Payer: Medicare PPO | Admitting: Cardiology

## 2017-11-09 ENCOUNTER — Encounter: Payer: Self-pay | Admitting: Cardiology

## 2017-11-09 VITALS — BP 132/90 | HR 64 | Resp 14 | Ht 71.0 in | Wt 224.0 lb

## 2017-11-09 DIAGNOSIS — R072 Precordial pain: Secondary | ICD-10-CM

## 2017-11-09 DIAGNOSIS — R0602 Shortness of breath: Secondary | ICD-10-CM | POA: Diagnosis not present

## 2017-11-09 DIAGNOSIS — I739 Peripheral vascular disease, unspecified: Secondary | ICD-10-CM

## 2017-11-09 DIAGNOSIS — Z8521 Personal history of malignant neoplasm of larynx: Secondary | ICD-10-CM

## 2017-11-09 LAB — HEPATIC FUNCTION PANEL
ALT: 59 IU/L — ABNORMAL HIGH (ref 0–44)
AST: 60 IU/L — ABNORMAL HIGH (ref 0–40)
Albumin: 4.4 g/dL (ref 3.6–4.8)
Alkaline Phosphatase: 62 IU/L (ref 39–117)
Bilirubin Total: 0.5 mg/dL (ref 0.0–1.2)
Bilirubin, Direct: 0.17 mg/dL (ref 0.00–0.40)
TOTAL PROTEIN: 7.3 g/dL (ref 6.0–8.5)

## 2017-11-09 LAB — LIPID PANEL
CHOLESTEROL TOTAL: 95 mg/dL — AB (ref 100–199)
Chol/HDL Ratio: 3.5 ratio (ref 0.0–5.0)
HDL: 27 mg/dL — ABNORMAL LOW (ref >39)
LDL CALC: 35 mg/dL (ref 0–99)
TRIGLYCERIDES: 166 mg/dL — AB (ref 0–149)
VLDL CHOLESTEROL CAL: 33 mg/dL (ref 5–40)

## 2017-11-09 NOTE — Progress Notes (Signed)
Cardiology Office Note:    Date:  11/09/2017   ID:  Mark Mejia, DOB [birth year], MRN [id number]  PCP:  Mark Jump, PA-C  Cardiologist:  Mark Campus, MD    Referring MD: Mark Jump, PA-C   No chief complaint on file. Doing well  History of Present Illness:    Mark Mejia is a 62 y.o. male with peripheral vascular disease.  He is being referred to vascular surgery for evaluation of his bilateral carotid arterial disease.  Decision was made to simply watch and wait.  Recently repeated carotid ultrasound showed no critical stenosis.  He was offered to consider CT angiogram however since late this study did not show significant increase in velocity decision has been made to simply watch the situation he is taking aspirin which I will continue his on statin as well denies have any chest pain tightness squeezing pressure burning chest he does have some shortness of breath with exertion but overall seems to be doing well  Past Medical History:  Diagnosis Date  . Cancer of cervical esophagus ([place]) 12/06/2012  . 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 Hypertrophic scar of skin 09/29/2016  . Precordial chest pain 12/26/2015  . Shortness of breath 12/26/2015    Past Surgical History:  Procedure Laterality Date  . GASTROSTOMY W/ FEEDING TUBE    . LEFT HEART CATH AND CORONARY ANGIOGRAPHY N/A 05/25/2017   Procedure: LEFT HEART CATH AND CORONARY ANGIOGRAPHY;  Surgeon: Martinique, Peter M, MD;  Location: [facility];  Service: Cardiovascular;  Laterality: N/A;  . PORTACATH PLACEMENT    . THROAT SURGERY      Current Medications: Current Meds  Medication Sig  . amLODipine (NORVASC) 5 MG tablet Take 5 mg by mouth daily.   Marland Kitchen aspirin EC 81 MG tablet Take 81 mg by mouth daily.   . cyclobenzaprine (FLEXERIL) 10 MG tablet Take 10 mg by mouth 3 (three) times daily as needed for muscle spasms.   Marland Kitchen guaiFENesin-codeine (VIRTUSSIN A/C) 100-10 MG/5ML syrup Take 5 mLs by mouth as needed  for cough (q 6 hours as needed for cough).  Marland Kitchen HYDROcodone-acetaminophen (NORCO/VICODIN) 5-325 MG tablet Take 1 tablet by mouth every 6 (six) hours as needed for moderate pain.   Marland Kitchen levothyroxine (SYNTHROID, LEVOTHROID) 50 MCG tablet Take 50 mcg by mouth daily before breakfast.   . meloxicam (MOBIC) 7.5 MG tablet Take 7.5 mg by mouth 2 (two) times daily.   Marland Kitchen omeprazole (PRILOSEC) 40 MG capsule Take 1 capsule (40 mg total) by mouth daily.  Marland Kitchen tolterodine (DETROL LA) 4 MG 24 hr capsule Take 4 mg by mouth daily.  . Vitamin D, Ergocalciferol, (DRISDOL) 50000 units CAPS capsule Take 50,000 Units by mouth every Sunday.      Allergies:   Patient has no known allergies.   Social History   Socioeconomic History  . Marital status: Married    Spouse name: Not on file  . Number of children: Not on file  . Years of education: Not on file  . Highest education level: Not on file  Occupational History  . Not on file  Social Needs  . Financial resource strain: Not on file  . Food insecurity:    Worry: Not on file    Inability: Not on file  . Transportation needs:    Medical: Not on file    Non-medical: Not on file  Tobacco Use  . Smoking status: Former Research scientist (life sciences)  . Smokeless tobacco: Never Used  Substance and  Sexual Activity  . Alcohol use: No    Frequency: Never  . Drug use: No  . Sexual activity: Not on file  Lifestyle  . Physical activity:    Days per week: Not on file    Minutes per session: Not on file  . Stress: Not on file  Relationships  . Social connections:    Talks on phone: Not on file    Gets together: Not on file    Attends religious service: Not on file    Active member of club or organization: Not on file    Attends meetings of clubs or organizations: Not on file    Relationship status: Not on file  Other Topics Concern  . Not on file  Social History Narrative  . Not on file     Family History: The patient's family history includes Arthritis in his mother; Cancer -  Other in his maternal grandmother; Hypertension in his mother. ROS:   Please see the history of present illness.    All 14 point review of systems negative except as described per history of present illness  EKGs/Labs/Other Studies Reviewed:      Recent Labs: 05/23/2017: BUN 12; Creatinine, Ser 0.94; Hemoglobin 13.5; Platelets 159; Potassium 3.9; Sodium 142  Recent Lipid Panel No results found for: CHOL, TRIG, HDL, CHOLHDL, VLDL, LDLCALC, LDLDIRECT  Physical Exam:    VS:  BP 132/90 (BP Location: Right Arm, Patient Position: Sitting)   Pulse 64   Resp 14   Ht 5\' 11"  (1.803 m)   Wt 224 lb (101.6 kg)   SpO2 98%   BMI 31.24 kg/m     Wt Readings from Last 3 Encounters:  11/09/17 224 lb (101.6 kg)  08/31/17 226 lb 14.4 oz (102.9 kg)  06/23/17 217 lb 12.8 oz (98.8 kg)     GEN:  Well nourished, well developed in no acute distress HEENT: Normal NECK: No JVD; No carotid bruits LYMPHATICS: No lymphadenopathy CARDIAC: RRR, no murmurs, no rubs, no gallops RESPIRATORY:  Clear to auscultation without rales, wheezing or rhonchi  ABDOMEN: Soft, non-tender, non-distended MUSCULOSKELETAL:  No edema; No deformity  SKIN: Warm and dry LOWER EXTREMITIES: no swelling NEUROLOGIC:  Alert and oriented x 3 PSYCHIATRIC:  Normal affect   ASSESSMENT:    1. Peripheral vascular disease, asymptomatic ([place])   2. History of laryngeal cancer   3. Shortness of breath   4. Precordial chest pain    PLAN:    In order of problems listed above:  1. Peripheral vascular disease followed by vascular surgery.  We will check fasting lipid profile make sure his LDL is well below 70. 2. History of laryngeal cancer that been stable. 3. Shortness of breath with normal cardiac catheterization couple months ago.  Will encourage to exercise. 4. Precordial chest pain denies having any.  Stable.   Medication Adjustments/Labs and Tests Ordered: Current medicines are reviewed at length with the patient today.   Concerns regarding medicines are outlined above.  No orders of the defined types were placed in this encounter.  Medication changes: No orders of the defined types were placed in this encounter.   Signed, Park Liter, MD, [facility] 11/09/2017 10:57 AM    [place]

## 2017-11-09 NOTE — Patient Instructions (Signed)
Medication Instructions:  Your physician recommends that you continue on your current medications as directed. Please refer to the Current Medication list given to you today.  Labwork: Your physician recommends that you have lab work today: Lipid and Liver  Testing/Procedures: None ordered  Follow-Up: Your physician recommends that you schedule a follow-up appointment in: 5 months with Dr. Agustin Cree. You will receive a letter or a phone call to schedule your appointment.   Any Other Special Instructions Will Be Listed Below (If Applicable).     If you need a refill on your cardiac medications before your next appointment, please call your pharmacy.

## 2017-11-10 ENCOUNTER — Telehealth: Payer: Self-pay | Admitting: Emergency Medicine

## 2017-11-10 DIAGNOSIS — I6529 Occlusion and stenosis of unspecified carotid artery: Secondary | ICD-10-CM

## 2017-11-10 NOTE — Telephone Encounter (Signed)
Patient's wife informed of lab results and for patient to have ast and alt redrawn in 3 weeks. She verbally understands and will notify patient.

## 2017-11-16 DIAGNOSIS — L910 Hypertrophic scar: Secondary | ICD-10-CM | POA: Diagnosis not present

## 2017-11-24 DIAGNOSIS — R0683 Snoring: Secondary | ICD-10-CM | POA: Diagnosis not present

## 2017-11-24 DIAGNOSIS — I119 Hypertensive heart disease without heart failure: Secondary | ICD-10-CM | POA: Diagnosis not present

## 2017-11-24 DIAGNOSIS — R740 Nonspecific elevation of levels of transaminase and lactic acid dehydrogenase [LDH]: Secondary | ICD-10-CM | POA: Diagnosis not present

## 2017-11-24 DIAGNOSIS — E034 Atrophy of thyroid (acquired): Secondary | ICD-10-CM | POA: Diagnosis not present

## 2017-11-24 DIAGNOSIS — I2510 Atherosclerotic heart disease of native coronary artery without angina pectoris: Secondary | ICD-10-CM | POA: Diagnosis not present

## 2017-11-24 DIAGNOSIS — E782 Mixed hyperlipidemia: Secondary | ICD-10-CM | POA: Diagnosis not present

## 2017-11-24 DIAGNOSIS — E559 Vitamin D deficiency, unspecified: Secondary | ICD-10-CM | POA: Diagnosis not present

## 2017-11-24 DIAGNOSIS — E291 Testicular hypofunction: Secondary | ICD-10-CM | POA: Diagnosis not present

## 2017-11-24 DIAGNOSIS — K219 Gastro-esophageal reflux disease without esophagitis: Secondary | ICD-10-CM | POA: Diagnosis not present

## 2017-11-24 DIAGNOSIS — Z23 Encounter for immunization: Secondary | ICD-10-CM | POA: Diagnosis not present

## 2017-11-28 DIAGNOSIS — L910 Hypertrophic scar: Secondary | ICD-10-CM | POA: Diagnosis not present

## 2017-12-12 DIAGNOSIS — R945 Abnormal results of liver function studies: Secondary | ICD-10-CM | POA: Diagnosis not present

## 2017-12-12 DIAGNOSIS — D689 Coagulation defect, unspecified: Secondary | ICD-10-CM | POA: Diagnosis not present

## 2017-12-12 DIAGNOSIS — R768 Other specified abnormal immunological findings in serum: Secondary | ICD-10-CM | POA: Diagnosis not present

## 2017-12-15 ENCOUNTER — Telehealth: Payer: Self-pay | Admitting: Emergency Medicine

## 2017-12-15 NOTE — Telephone Encounter (Signed)
Left message for patient to return call regarding lab work needed.

## 2017-12-26 DIAGNOSIS — K219 Gastro-esophageal reflux disease without esophagitis: Secondary | ICD-10-CM | POA: Diagnosis not present

## 2017-12-26 DIAGNOSIS — C159 Malignant neoplasm of esophagus, unspecified: Secondary | ICD-10-CM | POA: Diagnosis not present

## 2017-12-27 NOTE — Telephone Encounter (Signed)
Left message for patient to return call about needed lab work.

## 2018-01-09 DIAGNOSIS — B182 Chronic viral hepatitis C: Secondary | ICD-10-CM | POA: Diagnosis not present

## 2018-01-09 DIAGNOSIS — R945 Abnormal results of liver function studies: Secondary | ICD-10-CM | POA: Diagnosis not present

## 2018-01-09 DIAGNOSIS — Z23 Encounter for immunization: Secondary | ICD-10-CM | POA: Diagnosis not present

## 2018-01-13 DIAGNOSIS — C159 Malignant neoplasm of esophagus, unspecified: Secondary | ICD-10-CM | POA: Diagnosis not present

## 2018-01-13 DIAGNOSIS — Z1211 Encounter for screening for malignant neoplasm of colon: Secondary | ICD-10-CM | POA: Diagnosis not present

## 2018-01-13 DIAGNOSIS — Z800 Family history of malignant neoplasm of digestive organs: Secondary | ICD-10-CM | POA: Diagnosis not present

## 2018-01-13 DIAGNOSIS — Z8501 Personal history of malignant neoplasm of esophagus: Secondary | ICD-10-CM | POA: Diagnosis not present

## 2018-01-13 DIAGNOSIS — Z931 Gastrostomy status: Secondary | ICD-10-CM | POA: Diagnosis not present

## 2018-01-13 DIAGNOSIS — K449 Diaphragmatic hernia without obstruction or gangrene: Secondary | ICD-10-CM | POA: Diagnosis not present

## 2018-01-13 DIAGNOSIS — K5730 Diverticulosis of large intestine without perforation or abscess without bleeding: Secondary | ICD-10-CM | POA: Diagnosis not present

## 2018-01-13 DIAGNOSIS — Z86010 Personal history of colonic polyps: Secondary | ICD-10-CM | POA: Diagnosis not present

## 2018-01-13 DIAGNOSIS — K222 Esophageal obstruction: Secondary | ICD-10-CM | POA: Diagnosis not present

## 2018-01-13 DIAGNOSIS — R1310 Dysphagia, unspecified: Secondary | ICD-10-CM | POA: Diagnosis not present

## 2018-01-13 DIAGNOSIS — E079 Disorder of thyroid, unspecified: Secondary | ICD-10-CM | POA: Diagnosis not present

## 2018-01-13 LAB — HM COLONOSCOPY

## 2018-01-23 DIAGNOSIS — L910 Hypertrophic scar: Secondary | ICD-10-CM | POA: Diagnosis not present

## 2018-02-03 ENCOUNTER — Other Ambulatory Visit: Payer: Self-pay | Admitting: *Deleted

## 2018-02-03 MED ORDER — FUROSEMIDE 20 MG PO TABS: 20.0000 mg | ORAL_TABLET | Freq: Every day | ORAL | 3 refills | Status: DC

## 2018-02-09 DIAGNOSIS — Z23 Encounter for immunization: Secondary | ICD-10-CM | POA: Diagnosis not present

## 2018-02-09 DIAGNOSIS — B182 Chronic viral hepatitis C: Secondary | ICD-10-CM | POA: Diagnosis not present

## 2018-03-06 DIAGNOSIS — L910 Hypertrophic scar: Secondary | ICD-10-CM | POA: Diagnosis not present

## 2018-03-10 ENCOUNTER — Ambulatory Visit: Payer: Medicare PPO | Admitting: Vascular Surgery

## 2018-03-10 ENCOUNTER — Encounter (HOSPITAL_COMMUNITY): Payer: Medicare PPO

## 2018-03-10 ENCOUNTER — Encounter: Payer: Self-pay | Admitting: Vascular Surgery

## 2018-03-10 DIAGNOSIS — B182 Chronic viral hepatitis C: Secondary | ICD-10-CM | POA: Diagnosis not present

## 2018-03-10 DIAGNOSIS — Z79899 Other long term (current) drug therapy: Secondary | ICD-10-CM | POA: Diagnosis not present

## 2018-04-27 DIAGNOSIS — Z8501 Personal history of malignant neoplasm of esophagus: Secondary | ICD-10-CM

## 2018-05-05 ENCOUNTER — Other Ambulatory Visit: Payer: Self-pay

## 2018-05-05 ENCOUNTER — Ambulatory Visit (HOSPITAL_COMMUNITY)
Admission: RE | Admit: 2018-05-05 | Discharge: 2018-05-05 | Disposition: A | Discharge status: Home / Self Care | Payer: Medicare PPO | Source: Ambulatory Visit | Attending: Vascular Surgery | Admitting: Vascular Surgery

## 2018-05-05 ENCOUNTER — Ambulatory Visit: Payer: Medicare PPO | Admitting: Vascular Surgery

## 2018-05-05 ENCOUNTER — Encounter: Payer: Self-pay | Admitting: Vascular Surgery

## 2018-05-05 VITALS — BP 136/79 | HR 62 | Temp 97.0°F | Resp 20 | Ht 71.0 in | Wt 229.6 lb

## 2018-05-05 DIAGNOSIS — I6523 Occlusion and stenosis of bilateral carotid arteries: Secondary | ICD-10-CM

## 2018-05-05 NOTE — Progress Notes (Signed)
Patient ID: Mark Mejia, male   DOB: 21-Apr-1955, 63 y.o.   MRN: [id number]  Reason for Consult: Follow-up (carotid)   Referred by Darrol Jump, PA-C  Subjective:     HPI:  Mark Mejia is a 63 y.o. male with a history of cervical esophageal cancer and underwent chemoradiation.  Was found to have left-sided 35 to 99% stenosis of his internal carotid artery was determined to be 60 to 79% in our office.  A time he did not want CT angiogram to further evaluate.  Has no history of stroke TIA or amaurosis.  Has been fine since our last evaluation.  Overall doing well  Past Medical History:  Diagnosis Date  . Cancer of cervical esophagus ([place]) 12/06/2012  . 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 Hypertrophic scar of skin 09/29/2016  . Precordial chest pain 12/26/2015  . Shortness of breath 12/26/2015   Family History  Problem Relation Age of Onset  . Arthritis Mother   . Hypertension Mother   . Cancer - Other Maternal Grandmother    Past Surgical History:  Procedure Laterality Date  . GASTROSTOMY W/ FEEDING TUBE    . LEFT HEART CATH AND CORONARY ANGIOGRAPHY N/A 05/25/2017   Procedure: LEFT HEART CATH AND CORONARY ANGIOGRAPHY;  Surgeon: Martinique, Peter M, MD;  Location: [facility];  Service: Cardiovascular;  Laterality: N/A;  . PORTACATH PLACEMENT    . THROAT SURGERY      Short Social History:  Social History   Tobacco Use  . Smoking status: Former Research scientist (life sciences)  . Smokeless tobacco: Never Used  Substance Use Topics  . Alcohol use: No    Frequency: Never    No Known Allergies  Current Outpatient Medications  Medication Sig Dispense Refill  . amLODipine (NORVASC) 5 MG tablet Take 5 mg by mouth daily.   2  . aspirin EC 81 MG tablet Take 81 mg by mouth daily.     . cyclobenzaprine (FLEXERIL) 10 MG tablet Take 10 mg by mouth 3 (three) times daily as needed for muscle spasms.     . Glycerin-Hypromellose-PEG 400 ([facility] DRY EYE RELIEF OP) Place 2 drops into both eyes daily  as needed (for dry eyes).    Marland Kitchen guaiFENesin-codeine (ROBITUSSIN AC) 100-10 MG/5ML syrup Take 5-10 mLs by mouth every 6 (six) hours as needed for cough.    Marland Kitchen guaiFENesin-codeine (VIRTUSSIN A/C) 100-10 MG/5ML syrup Take 5 mLs by mouth as needed for cough (q 6 hours as needed for cough).    Marland Kitchen HYDROcodone-acetaminophen (NORCO/VICODIN) 5-325 MG tablet Take 1 tablet by mouth every 6 (six) hours as needed for moderate pain.     Marland Kitchen levothyroxine (SYNTHROID, LEVOTHROID) 50 MCG tablet Take 50 mcg by mouth daily before breakfast.     . meloxicam (MOBIC) 7.5 MG tablet Take 7.5 mg by mouth 2 (two) times daily.     . nitroGLYCERIN (NITROSTAT) 0.4 MG SL tablet Place 0.4 mg under the tongue every 5 (five) minutes as needed for chest pain.     Marland Kitchen omeprazole (PRILOSEC) 40 MG capsule Take 1 capsule (40 mg total) by mouth daily. 90 capsule 1  . Tetrahydroz-Polyvinyl Al-Povid (MURINE TEARS PLUS) 0.05-0.5-0.6 % SOLN Place 2 drops into both eyes daily as needed (for dry eyes).    . tolterodine (DETROL LA) 4 MG 24 hr capsule Take 4 mg by mouth daily.    . Vitamin D, Ergocalciferol, (DRISDOL) 50000 units CAPS capsule Take 50,000 Units by mouth every Sunday.     Marland Kitchen  furosemide (LASIX) 20 MG tablet Take 1 tablet (20 mg total) by mouth daily. 30 tablet 3  . rosuvastatin (CRESTOR) 20 MG tablet Take 1 tablet (20 mg total) by mouth daily. 90 tablet 3   No current facility-administered medications for this visit.     Review of Systems  Constitutional:  Constitutional negative. HENT: HENT negative.  Eyes: Eyes negative.  Respiratory: Respiratory negative.  Cardiovascular: Cardiovascular negative.  GI: Gastrointestinal negative.  Musculoskeletal: Musculoskeletal negative.  Neurological: Neurological negative. Hematologic: Hematologic/lymphatic negative.  Psychiatric: Psychiatric negative.        Objective:  Objective   Vitals:   05/05/18 0950 05/05/18 0952  BP: 135/79 136/79  Pulse: 62   Resp: 20   Temp: (!) 97 F  (36.1 C)   SpO2: 98%   Weight: 229 lb 9.8 oz (104.2 kg)   Height: 5\' 11"  (1.803 m)    Body mass index is 32.02 kg/m.  Physical Exam HENT:     Head: Normocephalic.  Eyes:     Pupils: Pupils are equal, round, and reactive to light.  Neck:     Musculoskeletal: Normal range of motion and neck supple.     Vascular: No carotid bruit.  Cardiovascular:     Rate and Rhythm: Normal rate and regular rhythm.  Pulmonary:     Effort: Pulmonary effort is normal.  Abdominal:     General: Abdomen is flat.     Palpations: Abdomen is soft.  Musculoskeletal: Normal range of motion.        General: No swelling.  Skin:    General: Skin is warm and dry.     Capillary Refill: Capillary refill takes less than 2 seconds.  Neurological:     General: No focal deficit present.     Mental Status: He is alert.  Psychiatric:        Mood and Affect: Mood normal.        Behavior: Behavior normal.        Thought Content: Thought content normal.        Judgment: Judgment normal.     Data: I have independently interpreted his bilateral carotid artery duplex which demonstrates 1 to 39% stenosis on the right and left 60 to 79% stenosis.     Assessment/Plan:     63 year old male with stable left ICA stenosis that is asymptomatic.  He can follow-up in 1 year with repeat carotid duplex.  We discussed the signs and symptoms of stroke to look out for would require emergent evaluation if he had any of these and then would possibly need surgical intervention.  We will plan for carotid stenting given his history of chemoradiation on the left if he ever did need intervention.  Short of this we will see him back in 1 year with duplex.     Waynetta Sandy MD [facility]

## 2018-05-18 ENCOUNTER — Ambulatory Visit: Payer: Medicare PPO | Admitting: Cardiology

## 2018-08-17 ENCOUNTER — Encounter: Payer: Self-pay | Admitting: Cardiology

## 2018-08-17 ENCOUNTER — Other Ambulatory Visit: Payer: Self-pay

## 2018-08-17 ENCOUNTER — Telehealth (INDEPENDENT_AMBULATORY_CARE_PROVIDER_SITE_OTHER): Payer: Medicare PPO | Admitting: Cardiology

## 2018-08-17 ENCOUNTER — Other Ambulatory Visit: Payer: Self-pay | Admitting: Cardiology

## 2018-08-17 VITALS — BP 137/78

## 2018-08-17 DIAGNOSIS — I209 Angina pectoris, unspecified: Secondary | ICD-10-CM | POA: Diagnosis not present

## 2018-08-17 DIAGNOSIS — R0602 Shortness of breath: Secondary | ICD-10-CM | POA: Diagnosis not present

## 2018-08-17 DIAGNOSIS — I739 Peripheral vascular disease, unspecified: Secondary | ICD-10-CM | POA: Diagnosis not present

## 2018-08-17 DIAGNOSIS — R072 Precordial pain: Secondary | ICD-10-CM

## 2018-08-17 MED ORDER — OMEPRAZOLE 40 MG PO CPDR: 40.0000 mg | DELAYED_RELEASE_CAPSULE | Freq: Every day | ORAL | 1 refills | Status: DC

## 2018-08-17 MED ORDER — ROSUVASTATIN CALCIUM 20 MG PO TABS: 20.0000 mg | ORAL_TABLET | Freq: Every day | ORAL | 3 refills | Status: DC

## 2018-08-17 MED ORDER — FUROSEMIDE 20 MG PO TABS: 20.0000 mg | ORAL_TABLET | Freq: Every day | ORAL | 3 refills | Status: DC

## 2018-08-17 NOTE — Progress Notes (Signed)
Virtual Visit via Telephone Note   This visit type was conducted due to national recommendations for restrictions regarding the COVID-19 Pandemic (e.g. social distancing) in an effort to limit this patient's exposure and mitigate transmission in our community.  Due to his co-morbid illnesses, this patient is at least at moderate risk for complications without adequate follow up.  This format is felt to be most appropriate for this patient at this time.  The patient did not have access to video technology/had technical difficulties with video requiring transitioning to audio format only (telephone).  All issues noted in this document were discussed and addressed.  No physical exam could be performed with this format.  Please refer to the patient's chart for his  consent to telehealth for [facility].  Evaluation Performed:  Follow-up visit  This visit type was conducted due to national recommendations for restrictions regarding the COVID-19 Pandemic (e.g. social distancing).  This format is felt to be most appropriate for this patient at this time.  All issues noted in this document were discussed and addressed.  No physical exam was performed (except for noted visual exam findings with Video Visits).  Please refer to the patient's chart (MyChart message for video visits and phone note for telephone visits) for the patient's consent to telehealth for [facility].  Date:  08/17/2018  ID: Mark Mejia, DOB 05-11-1955, MRN [id number]   Patient Location: [place] [address]   Provider location:   [place] Office  PCP:  Darrol Jump, PA-C  Cardiologist:  Jenne Campus, MD     Chief Complaint: Doing well  History of Present Illness:    Mark Mejia is a 63 y.o. male  who presents via audio/video conferencing for a telehealth visit today.  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  Still works in the garden and like doing it.   The patient does not have symptoms concerning for COVID-19 infection (fever, chills, cough, or new SHORTNESS OF BREATH).    Prior CV studies:   The following studies were reviewed today:  Cardiac catheterization from March 2019 showed: 1. Normal coronary anatomy 2. Mildly elevated LVEDP      Past Medical History:  Diagnosis Date  . 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 Hypertrophic scar of skin 09/29/2016  . Precordial chest pain 12/26/2015  . Shortness of breath 12/26/2015    Past Surgical History:  Procedure Laterality Date  . GASTROSTOMY W/ FEEDING TUBE    . LEFT HEART CATH AND CORONARY ANGIOGRAPHY N/A 05/25/2017   Procedure: LEFT HEART CATH AND CORONARY ANGIOGRAPHY;  Surgeon: Martinique, Peter M, MD;  Location: [facility];  Service: Cardiovascular;  Laterality: N/A;  . PORTACATH PLACEMENT    . THROAT SURGERY       Current Meds  Medication Sig  . amLODipine (NORVASC) 5 MG tablet Take 5 mg by mouth daily.   Marland Kitchen aspirin EC 81 MG tablet Take 81 mg by mouth daily.   . cyclobenzaprine (FLEXERIL) 10 MG tablet Take 10 mg by mouth 3 (three) times daily as needed for muscle spasms.   . furosemide (LASIX) 20 MG tablet Take 1  tablet (20 mg total) by mouth daily.  . Glycerin-Hypromellose-PEG 400 ([facility] DRY EYE RELIEF OP) Place 2 drops into both eyes daily as needed (for dry eyes).  Marland Kitchen HYDROcodone-acetaminophen (NORCO/VICODIN) 5-325 MG tablet Take 1 tablet by mouth every 6 (six) hours as needed for moderate pain.   Marland Kitchen levothyroxine (SYNTHROID, LEVOTHROID) 50 MCG tablet Take 50 mcg by mouth daily before breakfast.   . meloxicam (MOBIC) 7.5 MG  tablet Take 7.5 mg by mouth 2 (two) times daily.   . nitroGLYCERIN (NITROSTAT) 0.4 MG SL tablet Place 0.4 mg under the tongue every 5 (five) minutes as needed for chest pain.   Marland Kitchen omeprazole (PRILOSEC) 40 MG capsule Take 1 capsule (40 mg total) by mouth daily.  . rosuvastatin (CRESTOR) 20 MG tablet Take 1 tablet (20 mg total) by mouth daily.  . Tetrahydroz-Polyvinyl Al-Povid (MURINE TEARS PLUS) 0.05-0.5-0.6 % SOLN Place 2 drops into both eyes daily as needed (for dry eyes).  . tolterodine (DETROL LA) 4 MG 24 hr capsule Take 4 mg by mouth daily.      Family History: The patient's family history includes Arthritis in his mother; Cancer - Other in his maternal grandmother; Hypertension in his mother.   ROS:   Please see the history of present illness.     All other systems reviewed and are negative.   Labs/Other Tests and Data Reviewed:     Recent Labs: 11/09/2017: ALT 59  Recent Lipid Panel    Component Value Date/Time   CHOL 95 (L) 11/09/2017 0000   TRIG 166 (H) 11/09/2017 0000   HDL 27 (L) 11/09/2017 0000   CHOLHDL 3.5 11/09/2017 0000   LDLCALC 35 11/09/2017 0000      Exam:    Vital Signs:  BP 137/78     Wt Readings from Last 3 Encounters:  05/05/18 229 lb 9.8 oz (104.2 kg)  11/09/17 224 lb (101.6 kg)  08/31/17 226 lb 14.4 oz (102.9 kg)     Well nourished, well developed in no acute distress. Alert awake and attentive 3 happy to be able to talk to me on the phone denies having any complaints at the moment of interview  Diagnosis for this visit:   1. Peripheral vascular disease, asymptomatic ([place])   2. Shortness of breath   3. Precordial chest pain   4. Angina pectoris ([place])      ASSESSMENT & PLAN:    1.  Peripheral vascular disease routine regular carotic ultrasounds has been done by group from [place].  On appropriate medications which I will continue To.  Shortness of breath stable. 3.  Chest pain denies having any, cardiac catheterization showed  normal coronaries  COVID-19 Education: The signs and symptoms of COVID-19 were discussed with the patient and how to seek care for testing (follow up with PCP or arrange E-visit).  The importance of social distancing was discussed today.  Patient Risk:   After full review of this patients clinical status, I feel that they are at least moderate risk at this time.  Time:   Today, I have spent 15 minutes with the patient with telehealth technology discussing pt health issues.  I spent 5 minutes reviewing her chart before the visit.  Visit was finished at 11:45 AM.    Medication Adjustments/Labs and Tests Ordered: Current medicines are reviewed at length with the patient today.  Concerns regarding medicines are outlined above.  No orders of the defined types were placed in this encounter.  Medication changes: No orders of the  defined types were placed in this encounter.    Disposition: Follow-up 4 months  Signed, Park Liter, MD, [facility] 08/17/2018 12:17 PM    [place]

## 2018-08-17 NOTE — Patient Instructions (Signed)
Medication Instructions:  Your physician recommends that you continue on your current medications as directed. Please refer to the Current Medication list given to you today.  If you need a refill on your cardiac medications before your next appointment, please call your pharmacy.   Lab work: None.  If you have labs (blood work) drawn today and your tests are completely normal, you will receive your results only by: . MyChart Message (if you have MyChart) OR . A paper copy in the mail If you have any lab test that is abnormal or we need to change your treatment, we will call you to review the results.  Testing/Procedures: None.   Follow-Up: At [facility], you and your health needs are our priority.  As part of our continuing mission to provide you with exceptional heart care, we have created designated Provider Care Teams.  These Care Teams include your primary Cardiologist (physician) and Advanced Practice Providers (APPs -  Physician Assistants and Nurse Practitioners) who all work together to provide you with the care you need, when you need it. You will need a follow up appointment in 5 months.  Please call our office 2 months in advance to schedule this appointment.  You may see No primary care provider on file. or another member of our [facility] Provider Team in : Brian Munley, MD . Rajan Revankar, MD  Any Other Special Instructions Will Be Listed Below (If Applicable).    

## 2019-01-16 ENCOUNTER — Telehealth: Payer: Self-pay | Admitting: Cardiology

## 2019-01-19 ENCOUNTER — Ambulatory Visit: Payer: Medicare PPO | Admitting: Cardiology

## 2019-02-19 ENCOUNTER — Telehealth: Payer: Self-pay | Admitting: Cardiology

## 2019-02-19 NOTE — Telephone Encounter (Signed)
Patient was recommend to call our about taking Tylenol for the [facility] virus??

## 2019-02-19 NOTE — Telephone Encounter (Signed)
Telephone call to patient. States tested positive for Covid 19 yesterday. Asking if it is OK to take Tylenol. Informed it is oK to take tylenol at recommended dosage.

## 2019-03-21 ENCOUNTER — Other Ambulatory Visit: Payer: Self-pay

## 2019-03-21 ENCOUNTER — Ambulatory Visit (INDEPENDENT_AMBULATORY_CARE_PROVIDER_SITE_OTHER): Payer: Medicare PPO | Admitting: Cardiology

## 2019-03-21 ENCOUNTER — Encounter: Payer: Self-pay | Admitting: Cardiology

## 2019-03-21 VITALS — BP 132/82 | HR 70 | Ht 71.0 in | Wt 223.0 lb

## 2019-03-21 DIAGNOSIS — R079 Chest pain, unspecified: Secondary | ICD-10-CM

## 2019-03-21 DIAGNOSIS — I739 Peripheral vascular disease, unspecified: Secondary | ICD-10-CM | POA: Diagnosis not present

## 2019-03-21 DIAGNOSIS — Z8521 Personal history of malignant neoplasm of larynx: Secondary | ICD-10-CM | POA: Diagnosis not present

## 2019-03-21 DIAGNOSIS — R0602 Shortness of breath: Secondary | ICD-10-CM

## 2019-03-21 NOTE — Progress Notes (Signed)
Cardiology Office Note:    Date:  03/21/2019   ID:  Mark Mejia, DOB 1955-10-07, MRN [id number]  PCP:  Darrol Jump, PA-C  Cardiologist:  Jenne Campus, MD    Referring MD: Darrol Jump, PA-C   Chief Complaint  Patient presents with  . Follow-up    History of Present Illness:    Mark Mejia is a 64 y.o. 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  Comes today 2 months for follow-up will doing great denies have any chest pain tightness squeezing pressure mentions he is trying to exercise on a regular basis overall looks and does good  Past Medical History:  Diagnosis Date  . Angina pectoris ([place]) 05/25/2017  . 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 Encounter for adjustment and management of vascular access device 03/10/2016  . 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 Hypertrophic scar of skin 09/29/2016  . Peripheral vascular disease, asymptomatic ([place]) 05/23/2017   80-99% stenosis in the left internal coronary artery  . Precordial chest pain 12/26/2015  . Shortness of breath 12/26/2015    Past Surgical History:  Procedure Laterality Date  . GASTROSTOMY W/ FEEDING TUBE    . LEFT HEART CATH AND CORONARY ANGIOGRAPHY N/A 05/25/2017   Procedure: LEFT HEART CATH AND CORONARY ANGIOGRAPHY;  Surgeon: Martinique, Peter M, MD;  Location: [facility];  Service: Cardiovascular;  Laterality: N/A;  . PORTACATH PLACEMENT    . THROAT SURGERY      Current Medications: Current Meds  Medication Sig  . amLODipine (NORVASC) 5 MG tablet Take 5 mg by mouth daily.   Marland Kitchen aspirin EC 81 MG tablet Take 81 mg by mouth daily.   . cyclobenzaprine (FLEXERIL) 10 MG tablet Take 10 mg by mouth 3 (three) times daily as needed for muscle spasms.   . furosemide  (LASIX) 20 MG tablet Take 1 tablet (20 mg total) by mouth daily.  . Glycerin-Hypromellose-PEG 400 ([facility] DRY EYE RELIEF OP) Place 2 drops into both eyes daily as needed (for dry eyes).  Marland Kitchen HYDROcodone-acetaminophen (NORCO/VICODIN) 5-325 MG tablet Take 1 tablet by mouth every 6 (six) hours as needed for moderate pain.   Marland Kitchen levothyroxine (SYNTHROID, LEVOTHROID) 50 MCG tablet Take 50 mcg by mouth daily before breakfast.   . meloxicam (MOBIC) 7.5 MG tablet Take 7.5 mg by mouth 2 (two) times daily.   . nitroGLYCERIN (NITROSTAT) 0.4 MG SL tablet Place 0.4 mg under the tongue every 5 (five) minutes as needed for chest pain.   Marland Kitchen omeprazole (PRILOSEC) 40 MG capsule TAKE 1 CAPSULE BY MOUTH ONCE (1) DAILY  . rosuvastatin (CRESTOR) 20 MG tablet Take 1 tablet (20 mg total) by mouth daily.  . Tetrahydroz-Polyvinyl Al-Povid (MURINE TEARS PLUS) 0.05-0.5-0.6 % SOLN Place 2 drops into both eyes daily as needed (for dry eyes).  . tolterodine (DETROL LA) 4 MG 24 hr capsule Take 4 mg by mouth daily.  . Vitamin D, Ergocalciferol, (DRISDOL) 50000 units CAPS capsule Take 50,000 Units by mouth every Sunday.      Allergies:   Patient has no known allergies.   Social History   Socioeconomic History  . Marital status: Married    Spouse name: Not on file  . Number of children: Not on file  . Years of education: Not on file  .  Highest education level: Not on file  Occupational History  . Not on file  Tobacco Use  . Smoking status: Former Research scientist (life sciences)  . Smokeless tobacco: Never Used  Substance and Sexual Activity  . Alcohol use: No  . Drug use: No  . Sexual activity: Not on file  Other Topics Concern  . Not on file  Social History Narrative  . Not on file   Social Determinants of Health   Financial Resource Strain:   . Difficulty of Paying Living Expenses: Not on file  Food Insecurity:   . Worried About Charity fundraiser in the Last Year: Not on file  . Ran Out of Food in the Last Year: Not on file   Transportation Needs:   . Lack of Transportation (Medical): Not on file  . Lack of Transportation (Non-Medical): Not on file  Physical Activity:   . Days of Exercise per Week: Not on file  . Minutes of Exercise per Session: Not on file  Stress:   . Feeling of Stress : Not on file  Social Connections:   . Frequency of Communication with Friends and Family: Not on file  . Frequency of Social Gatherings with Friends and Family: Not on file  . Attends Religious Services: Not on file  . Active Member of Clubs or Organizations: Not on file  . Attends Archivist Meetings: Not on file  . Marital Status: Not on file     Family History: The patient's family history includes Arthritis in his mother; Cancer - Other in his maternal grandmother; Hypertension in his mother. ROS:   Please see the history of present illness.    All 14 point review of systems negative except as described per history of present illness  EKGs/Labs/Other Studies Reviewed:      Recent Labs: No results found for requested labs within last 8760 hours.  Recent Lipid Panel    Component Value Date/Time   CHOL 95 (L) 11/09/2017 0000   TRIG 166 (H) 11/09/2017 0000   HDL 27 (L) 11/09/2017 0000   CHOLHDL 3.5 11/09/2017 0000   LDLCALC 35 11/09/2017 0000    Physical Exam:    VS:  BP 132/82 (BP Location: Left Arm, Patient Position: Sitting, Cuff Size: Large)   Pulse 70   Ht 5\' 11"  (1.803 m)   Wt 223 lb (101.2 kg)   SpO2 93%   BMI 31.10 kg/m     Wt Readings from Last 3 Encounters:  03/21/19 223 lb (101.2 kg)  05/05/18 229 lb 9.8 oz (104.2 kg)  11/09/17 224 lb (101.6 kg)     GEN:  Well nourished, well developed in no acute distress HEENT: Normal NECK: No JVD; No carotid bruits LYMPHATICS: No lymphadenopathy CARDIAC: RRR, no murmurs, no rubs, no gallops RESPIRATORY:  Clear to auscultation without rales, wheezing or rhonchi  ABDOMEN: Soft, non-tender, non-distended MUSCULOSKELETAL:  No edema; No  deformity  SKIN: Warm and dry LOWER EXTREMITIES: no swelling NEUROLOGIC:  Alert and oriented x 3 PSYCHIATRIC:  Normal affect   ASSESSMENT:    1. Chest pain of uncertain etiology   2. Peripheral vascular disease, asymptomatic ([place])   3. History of laryngeal cancer   4. Shortness of breath    PLAN:    In order of problems listed above:  1. Chest pain uncertain etiology, cardiac catheterization year ago showing normal coronaries.  Continue risk factors modifications 2. Peripheral vascular disease stable from that point of view. 3. History of laryngeal consult: Stable 4.  Shortness of breath but denies having any.     Medication Adjustments/Labs and Tests Ordered: Current medicines are reviewed at length with the patient today.  Concerns regarding medicines are outlined above.  No orders of the defined types were placed in this encounter.  Medication changes: No orders of the defined types were placed in this encounter.   Signed, Park Liter, MD, [facility] 03/21/2019 11:26 AM    [place]

## 2019-03-21 NOTE — Patient Instructions (Signed)

## 2019-03-21 NOTE — Addendum Note (Signed)
Addended by: Jerl Santos R on: 03/21/2019 04:20 PM   Modules accepted: Orders

## 2019-04-08 IMAGING — DX DG CHEST 2V
2 series · 2 of 2 positions shown · non-contrast
Comparison: CT chest of 02/05/2016 and chest x-ray of 09/11/2013

CLINICAL DATA: Preop for cardiac catheterization, chest pain,
former smoking history

EXAM:
CHEST - 2 VIEW

[chest pa]
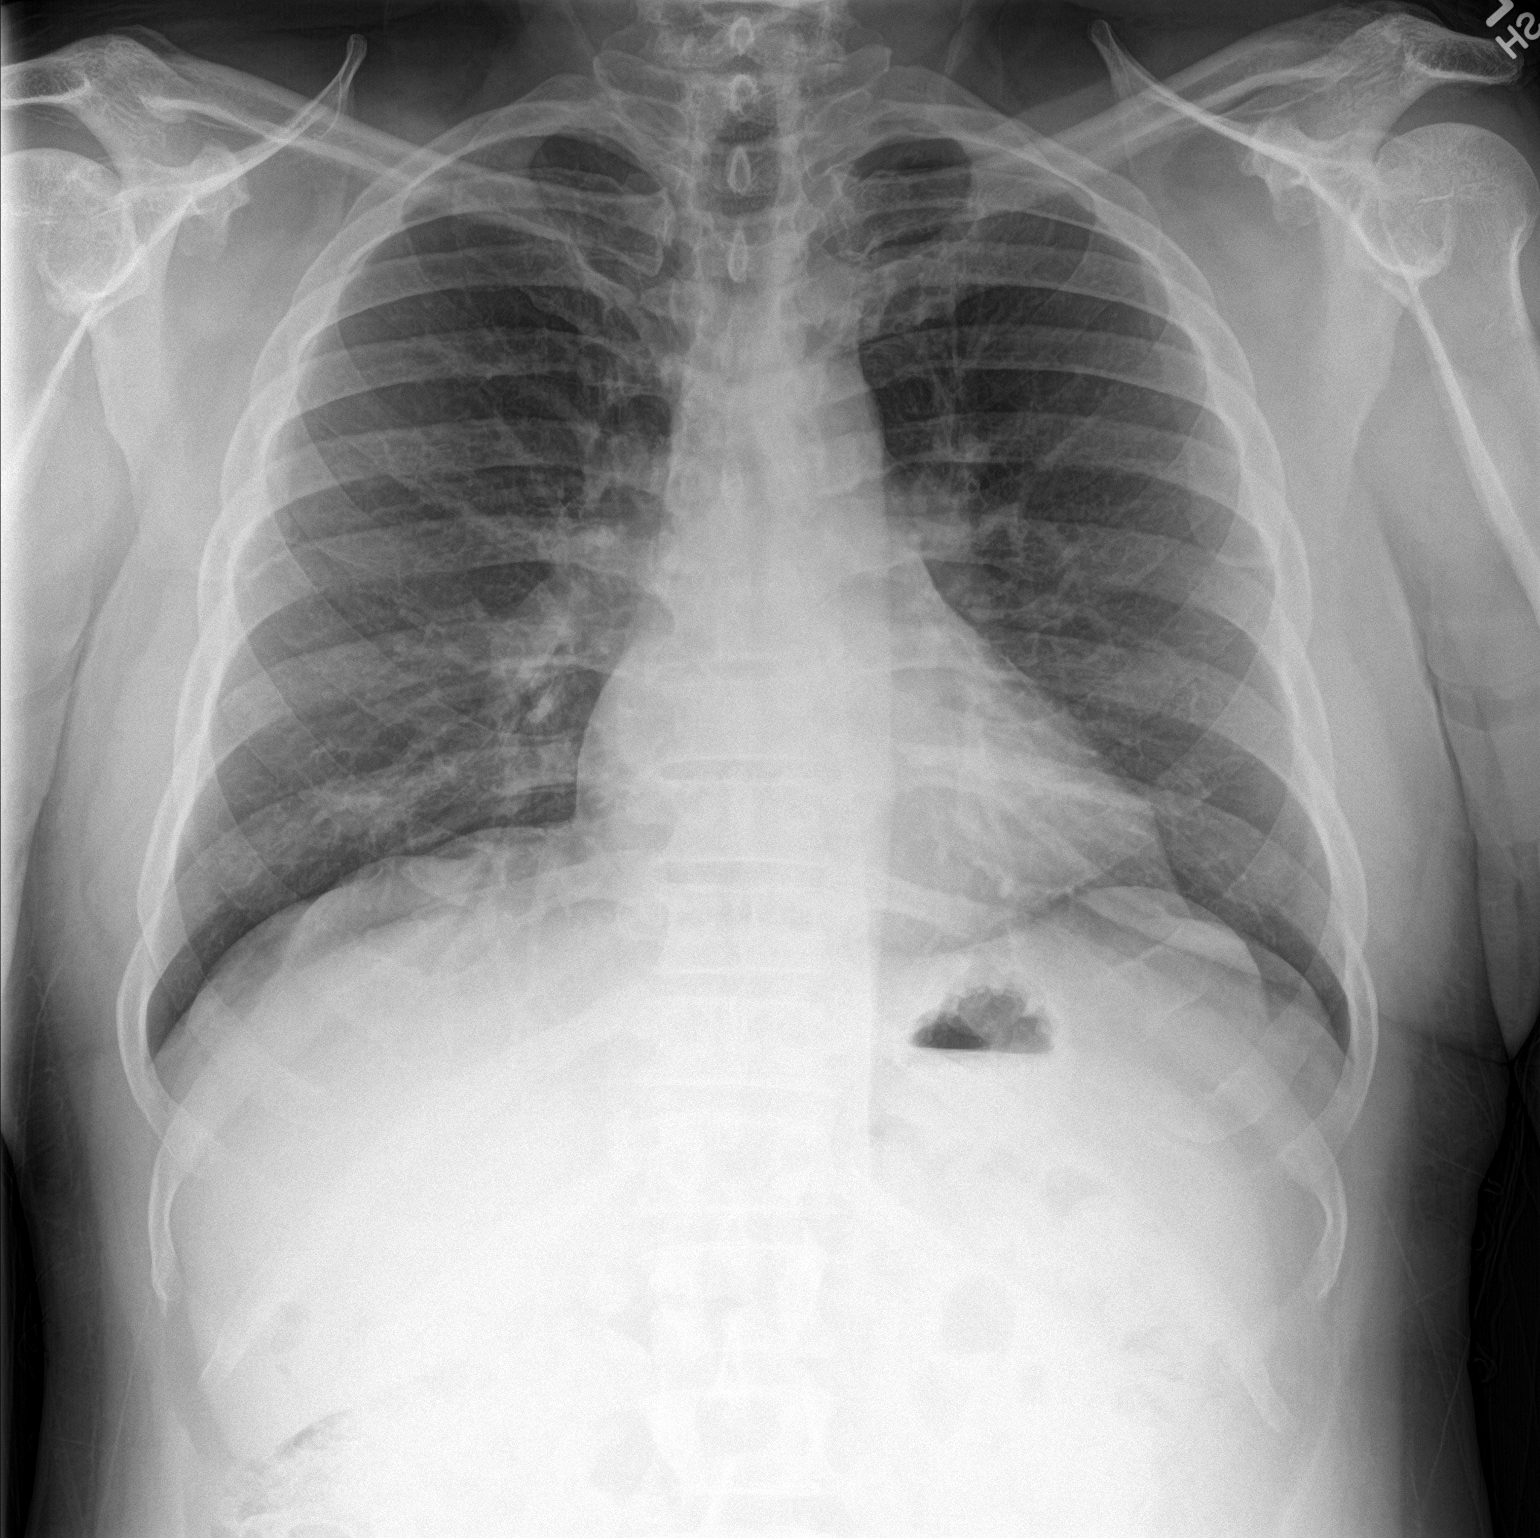

[chest lat]
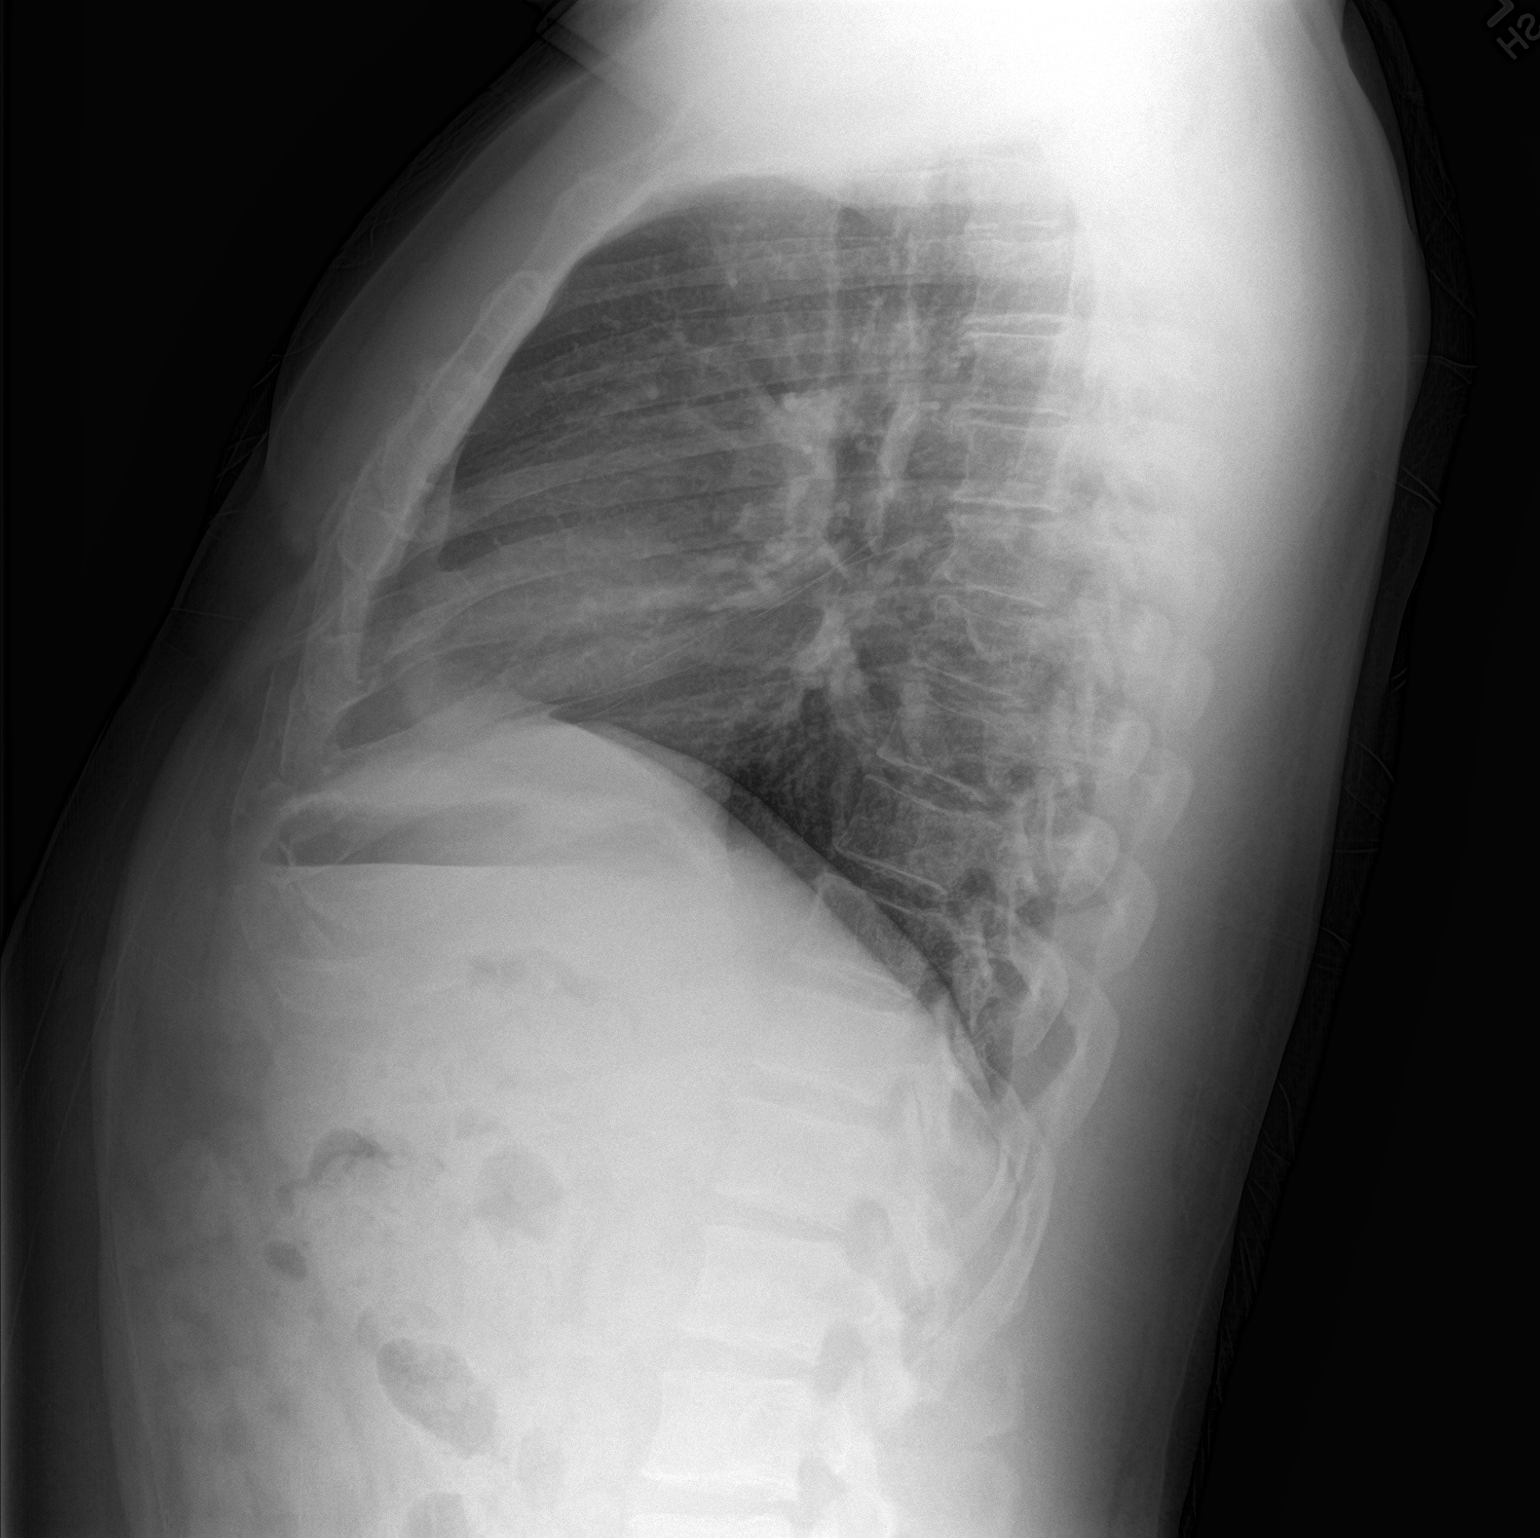

[2 of 2 positions shown; findings below may reference images not displayed]

FINDINGS: No active infiltrate or effusion is seen. There is no evidence of
mediastinal or hilar adenopathy by chest x-ray. Contours appear
normal. The heart is within upper limits of normal and stable. No
bony abnormality is seen.
IMPRESSION: No active cardiopulmonary disease. No definite mediastinal or hilar
adenopathy is seen by chest x-ray.

## 2019-05-21 ENCOUNTER — Other Ambulatory Visit: Payer: Self-pay

## 2019-05-21 MED ORDER — AMLODIPINE BESYLATE 5 MG PO TABS: 5.0000 mg | ORAL_TABLET | Freq: Every day | ORAL | 0 refills | Status: DC

## 2019-05-29 ENCOUNTER — Other Ambulatory Visit: Payer: Self-pay | Admitting: Family Medicine

## 2019-05-29 ENCOUNTER — Other Ambulatory Visit: Payer: Self-pay | Admitting: Cardiology

## 2019-06-13 ENCOUNTER — Encounter: Payer: Self-pay | Admitting: Family Medicine

## 2019-06-13 NOTE — Progress Notes (Signed)
Established Patient Office Visit  Subjective:  Patient ID: Mark Mejia, male    DOB: December 28, 1955  Age: 64 y.o. MRN: [id number]  CC:  Chief Complaint  Patient presents with  . COPD  . Hyperlipidemia  . Hypertension  . Diabetes  . 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  Date of diagnosis 08/14/2014.  He is currently taking Synthroid, 50 mcg daily.  TSH was last checked over a year ago.Marland Kitchen  He denies any related symptoms.  He reports no symptoms suggestive of adverse medication effect.      Patient has GERD. He is currently on omeprazole 40 mg once daily. Past medical history is pertinent for Esophageal cancer/throat cancer.  His oncologist is Dr. Bobby Rumpf.  His plastic surgeon is Dr. Harlow Mares.    Patient has history of hypertensive atherosclerosis in the form of carotid artery disease.  He follows with vascular surgeon Dr. Donzetta Matters annually. Mark Mejia denies history of prior myocardial infarction.  The course of the disease has been stable.  Currently, his treatment regimen consists of daily 81 mg aspirin,  Norvasc, and Crestor.  A cardiac cath has been done previously in 04/2017 which showed normal coronaries. Mark Mejia is compliant with low fat diet, low salt diet, and taking medications as recommended and prescribed. He also has had a prior an echocardiogram has been done and showed a normal ejection fraction.  Mild diastolic dysfunction.  And carotid dopplers were initially done when he was receiving chemoradiation for cervical esophageal cancer.  At [place] left side was found to have 80 to 90% stenosis of his internal carotid artery.  On repeat ultrasound done in vascular surgeons office it was determined to be 60 to 79%.  It was recommended to return annually and is currently due for a visit with Dr. Donzetta Matters.       Pt presents with hyperlipidemia.  Date of diagnosis 2019.  Current treatment includes Crestor.  Compliance with treatment has been good; he  takes his medication as directed, maintains his low cholesterol diet, follows up as directed, and maintains his exercise regimen.  , He has hypogonadism with low testosterone level.  He feels that his dose may not be high enough. He is requesting a refill of the testosterone and Viagra.  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  As a result of this he has scarring along the right side of his face and neck.  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  He underwent steroid injections for the treatment of the keloid which is significantly better.  Past Medical History:  Diagnosis Date  . Angina pectoris ([place]) 05/25/2017  . Atrophy of thyroid   . CAD (coronary artery disease)   . Cancer of cervical esophagus ([place]) 12/06/2012  . Encounter for adjustment and management of vascular access device 03/10/2016  . GERD (gastroesophageal reflux disease)   . Hepatitis C   . History of esophageal cancer   . 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 Hypertrophic scar of skin 09/29/2016  . Mixed hyperlipidemia   . Peripheral vascular disease, asymptomatic ([place]) 05/23/2017   80-99% stenosis in the left internal coronary artery  . Precordial chest pain 12/26/2015  . Shortness of breath 12/26/2015  . Testicular hypofunction   . Vitamin D deficiency     Past Surgical History:  Procedure Laterality Date  .  GASTROSTOMY W/ FEEDING TUBE    . LEFT HEART CATH AND CORONARY ANGIOGRAPHY N/A 05/25/2017   Procedure: LEFT HEART CATH AND CORONARY ANGIOGRAPHY;  Surgeon: Martinique, Peter M, MD;  Location: [facility];  Service: Cardiovascular;  Laterality: N/A;  . PORTACATH PLACEMENT    . THROAT SURGERY      Family History  Problem Relation Age of Onset  . Arthritis Mother   . Hypertension Mother   . Cancer - Other Maternal Grandmother     Social History   Socioeconomic History  . Marital status: Married    Spouse  name: Not on file  . Number of children: Not on file  . Years of education: Not on file  . Highest education level: Not on file  Occupational History  . Occupation: Retired  Tobacco Use  . Smoking status: Never Smoker  . Smokeless tobacco: Never Used  Substance and Sexual Activity  . Alcohol use: No  . Drug use: No  . Sexual activity: Not on file  Other Topics Concern  . Not on file  Social History Narrative  . Not on file   Social Determinants of Health   Financial Resource Strain:   . Difficulty of Paying Living Expenses:   Food Insecurity:   . Worried About Charity fundraiser in the Last Year:   . Arboriculturist in the Last Year:   Transportation Needs:   . Film/video editor (Medical):   Marland Kitchen Lack of Transportation (Non-Medical):   Physical Activity:   . Days of Exercise per Week:   . Minutes of Exercise per Session:   Stress:   . Feeling of Stress :   Social Connections:   . Frequency of Communication with Friends and Family:   . Frequency of Social Gatherings with Friends and Family:   . Attends Religious Services:   . Active Member of Clubs or Organizations:   . Attends Archivist Meetings:   Marland Kitchen Marital Status:   Intimate Partner Violence:   . Fear of Current or Ex-Partner:   . Emotionally Abused:   Marland Kitchen Physically Abused:   . Sexually Abused:     Outpatient Medications Prior to Visit  Medication Sig Dispense Refill  . amLODipine (NORVASC) 5 MG tablet TAKE 1 TABLET BY MOUTH ONCE (1) DAILY 90 tablet 0  . aspirin EC 81 MG tablet Take 81 mg by mouth daily.     . furosemide (LASIX) 20 MG tablet Take 1 tablet (20 mg total) by mouth daily. 30 tablet 3  . Glycerin-Hypromellose-PEG 400 ([facility] DRY EYE RELIEF OP) Place 2 drops into both eyes daily as needed (for dry eyes).    . nitroGLYCERIN (NITROSTAT) 0.4 MG SL tablet Place 0.4 mg under the tongue every 5 (five) minutes as needed for chest pain.     Marland Kitchen omeprazole (PRILOSEC) 40 MG capsule TAKE 1 CAPSULE BY  MOUTH ONCE (1) DAILY 90 capsule 1  . rosuvastatin (CRESTOR) 20 MG tablet Take 1 tablet (20 mg total) by mouth daily. 90 tablet 3  . cyclobenzaprine (FLEXERIL) 10 MG tablet Take 10 mg by mouth 3 (three) times daily as needed for muscle spasms.     Marland Kitchen HYDROcodone-acetaminophen (NORCO/VICODIN) 5-325 MG tablet Take 1 tablet by mouth every 6 (six) hours as needed for moderate pain.     Marland Kitchen levothyroxine (SYNTHROID) 50 MCG tablet TAKE 1 TABLET BY MOUTH EVERY MORNING ON AN EMPTY STOMACH 90 tablet 1  . meloxicam (MOBIC) 7.5 MG tablet Take  7.5 mg by mouth 2 (two) times daily.     . sildenafil (VIAGRA) 100 MG tablet Take 100 mg by mouth daily as needed for erectile dysfunction.    . Tetrahydroz-Polyvinyl Al-Povid (MURINE TEARS PLUS) 0.05-0.5-0.6 % SOLN Place 2 drops into both eyes daily as needed (for dry eyes).    . tolterodine (DETROL LA) 4 MG 24 hr capsule Take 4 mg by mouth daily.    . Vitamin D, Ergocalciferol, (DRISDOL) 50000 units CAPS capsule Take 50,000 Units by mouth every Sunday.      No facility-administered medications prior to visit.    No Known Allergies  ROS Review of Systems  Constitutional: Negative for chills, diaphoresis, fatigue and fever.  HENT: Negative for congestion, ear pain and sore throat.   Respiratory: Negative for cough and shortness of breath.   Cardiovascular: Negative for chest pain and leg swelling.  Gastrointestinal: Negative for abdominal pain, constipation, diarrhea, nausea and vomiting.  Endocrine: Negative for polydipsia, polyphagia and polyuria.  Genitourinary: Negative for dysuria and urgency.       Nocturia  Musculoskeletal: Positive for back pain. Negative for arthralgias and myalgias.       DDD. Takes hydrocodone/apap. Meloxicam.  Neurological: Negative for dizziness, numbness and headaches.  Psychiatric/Behavioral: Positive for sleep disturbance (nocturia - wakes up and can't go back to sleep. ). Negative for dysphoric mood.      Objective:     Physical Exam  Constitutional: He is oriented to person, place, and time. He appears well-developed and well-nourished.  Neck:  No carotid bruits.  Cardiovascular: Normal rate, regular rhythm and normal heart sounds.  Pulmonary/Chest: Effort normal and breath sounds normal.  Abdominal: Soft. Bowel sounds are normal. There is abdominal tenderness.  Musculoskeletal:        General: No edema.  Neurological: He is alert and oriented to person, place, and time.  Psychiatric: He has a normal mood and affect. His behavior is normal.    BP 116/72   Pulse 84   Temp 97.9 F (36.6 C)   Resp 16   Ht 5\' 11"  (1.803 m)   Wt 218 lb 12.8 oz (99.2 kg)   BMI 30.52 kg/m  Wt Readings from Last 3 Encounters:  06/14/19 218 lb 12.8 oz (99.2 kg)  03/21/19 223 lb (101.2 kg)  05/05/18 229 lb 9.8 oz (104.2 kg)     Health Maintenance Due  Topic Date Due  . Hepatitis C Screening  Never done  . HIV Screening  Never done  . COVID-19 Vaccine (1) Never done  . TETANUS/TDAP  Never done  . PNA vac Low Risk Adult (1 of 2 - PCV13) Never done    There are no preventive care reminders to display for this patient.  Lab Results  Component Value Date   TSH 4.710 (H) 06/14/2019   Lab Results  Component Value Date   WBC 4.6 06/14/2019   HGB 13.6 06/14/2019   HCT 42.3 06/14/2019   MCV 83 06/14/2019   PLT 152 06/14/2019   Lab Results  Component Value Date   NA 140 06/14/2019   K 4.2 06/14/2019   CO2 24 06/14/2019   GLUCOSE 89 06/14/2019   BUN 9 06/14/2019   CREATININE 1.12 06/14/2019   BILITOT 0.5 06/14/2019   ALKPHOS 55 06/14/2019   AST 39 06/14/2019   ALT 28 06/14/2019   PROT 7.4 06/14/2019   ALBUMIN 4.5 06/14/2019   CALCIUM 9.4 06/14/2019   Lab Results  Component Value Date   CHOL  103 06/14/2019   Lab Results  Component Value Date   HDL 28 (L) 06/14/2019   Lab Results  Component Value Date   LDLCALC 60 06/14/2019   Lab Results  Component Value Date   TRIG 70 06/14/2019   Lab  Results  Component Value Date   CHOLHDL 3.7 06/14/2019   No results found for: HGBA1C    Assessment & Plan:   1. Mixed hyperlipidemia Well controlled.  No changes to medicines.  Continue to work on eating a healthy diet and exercise.  Labs drawn today.  - Lipid panel  2. Hypertensive cardiovascular disease in form of carotid stenosis. Well controlled.  No changes to medicines.  Continue to work on eating a healthy diet and exercise.  Labs drawn today.  - Comprehensive metabolic panel - CBC with Differential/Platelet  3. Atrophy of thyroid Check lab. - TSH  4. Male hypogonadism  - Testosterone,Free and Total  5. Benign prostatic hyperplasia with nocturia Start on flomax.  This may help his insomnia situation.  6. History of esophageal/laryngeal cancer. - completed treatment and as of March/2020 he was released from oncology considered to be cancer free!  Follow-up: Return in about 6 months (around 12/14/2019) for fasting. Nurse visit next week for testosterone shot. AWV.    Rochel Brome, MD  .Tami Ribas

## 2019-06-14 ENCOUNTER — Encounter: Payer: Self-pay | Admitting: Family Medicine

## 2019-06-14 ENCOUNTER — Ambulatory Visit (INDEPENDENT_AMBULATORY_CARE_PROVIDER_SITE_OTHER): Payer: Medicare PPO | Admitting: Family Medicine

## 2019-06-14 ENCOUNTER — Other Ambulatory Visit: Payer: Self-pay

## 2019-06-14 VITALS — BP 116/72 | HR 84 | Temp 97.9°F | Resp 16 | Ht 71.0 in | Wt 218.8 lb

## 2019-06-14 DIAGNOSIS — N401 Enlarged prostate with lower urinary tract symptoms: Secondary | ICD-10-CM

## 2019-06-14 DIAGNOSIS — I130 Hypertensive heart and chronic kidney disease with heart failure and stage 1 through stage 4 chronic kidney disease, or unspecified chronic kidney disease: Secondary | ICD-10-CM | POA: Insufficient documentation

## 2019-06-14 DIAGNOSIS — E034 Atrophy of thyroid (acquired): Secondary | ICD-10-CM | POA: Diagnosis not present

## 2019-06-14 DIAGNOSIS — I5083 High output heart failure: Secondary | ICD-10-CM

## 2019-06-14 DIAGNOSIS — E782 Mixed hyperlipidemia: Secondary | ICD-10-CM | POA: Diagnosis not present

## 2019-06-14 DIAGNOSIS — E291 Testicular hypofunction: Secondary | ICD-10-CM

## 2019-06-14 DIAGNOSIS — R351 Nocturia: Secondary | ICD-10-CM

## 2019-06-14 DIAGNOSIS — Z8521 Personal history of malignant neoplasm of larynx: Secondary | ICD-10-CM

## 2019-06-14 HISTORY — DX: Testicular hypofunction: E29.1

## 2019-06-14 HISTORY — DX: Hypertensive heart and chronic kidney disease with heart failure and stage 1 through stage 4 chronic kidney disease, or unspecified chronic kidney disease: I13.0

## 2019-06-14 HISTORY — DX: Benign prostatic hyperplasia with lower urinary tract symptoms: N40.1

## 2019-06-14 HISTORY — DX: Benign prostatic hyperplasia with lower urinary tract symptoms: R35.1

## 2019-06-14 MED ORDER — MELOXICAM 7.5 MG PO TABS: 7.5000 mg | ORAL_TABLET | Freq: Two times a day (BID) | ORAL | 1 refills | Status: DC

## 2019-06-14 MED ORDER — VITAMIN D (ERGOCALCIFEROL) 1.25 MG (50000 UNIT) PO CAPS: 50000.0000 [IU] | ORAL_CAPSULE | ORAL | 1 refills | Status: DC

## 2019-06-14 MED ORDER — HYDROCODONE-ACETAMINOPHEN 5-325 MG PO TABS: 1.0000 | ORAL_TABLET | Freq: Four times a day (QID) | ORAL | 0 refills | Status: DC | PRN

## 2019-06-14 MED ORDER — TESTOSTERONE CYPIONATE 200 MG/ML IM SOLN: 200.0000 mg | INTRAMUSCULAR | 1 refills | Status: DC

## 2019-06-14 MED ORDER — GUAIFENESIN-CODEINE 100-10 MG/5ML PO SOLN: 5.0000 mL | Freq: Three times a day (TID) | ORAL | 0 refills | Status: DC | PRN

## 2019-06-14 MED ORDER — SILDENAFIL CITRATE 100 MG PO TABS: 100.0000 mg | ORAL_TABLET | Freq: Every day | ORAL | 5 refills | Status: DC | PRN

## 2019-06-14 NOTE — Patient Instructions (Signed)
Start flomax 0.4 mg once at night for increased urination/prostate enlargement.  Start testosterone shots next week.  Continue to eat healthy. Recommend continue to work on exercise.

## 2019-06-16 LAB — COMPREHENSIVE METABOLIC PANEL
ALT: 28 IU/L (ref 0–44)
AST: 39 IU/L (ref 0–40)
Albumin/Globulin Ratio: 1.6 (ref 1.2–2.2)
Albumin: 4.5 g/dL (ref 3.8–4.8)
Alkaline Phosphatase: 55 IU/L (ref 39–117)
BUN/Creatinine Ratio: 8 — ABNORMAL LOW (ref 10–24)
BUN: 9 mg/dL (ref 8–27)
Bilirubin Total: 0.5 mg/dL (ref 0.0–1.2)
CO2: 24 mmol/L (ref 20–29)
Calcium: 9.4 mg/dL (ref 8.6–10.2)
Chloride: 104 mmol/L (ref 96–106)
Creatinine, Ser: 1.12 mg/dL (ref 0.76–1.27)
GFR calc Af Amer: 79 mL/min/{1.73_m2} (ref >59)
GFR calc non Af Amer: 69 mL/min/{1.73_m2} (ref >59)
Globulin, Total: 2.9 g/dL (ref 1.5–4.5)
Glucose: 89 mg/dL (ref 65–99)
Potassium: 4.2 mmol/L (ref 3.5–5.2)
Sodium: 140 mmol/L (ref 134–144)
Total Protein: 7.4 g/dL (ref 6.0–8.5)

## 2019-06-16 LAB — TESTOSTERONE,FREE AND TOTAL
Testosterone, Free: 12.2 pg/mL (ref 6.6–18.1)
Testosterone: 297 ng/dL (ref 264–916)

## 2019-06-16 LAB — CBC WITH DIFFERENTIAL/PLATELET
Basophils Absolute: 0.1 10*3/uL (ref 0.0–0.2)
Basos: 1 %
EOS (ABSOLUTE): 0.2 10*3/uL (ref 0.0–0.4)
Eos: 5 %
Hematocrit: 42.3 % (ref 37.5–51.0)
Hemoglobin: 13.6 g/dL (ref 13.0–17.7)
Immature Grans (Abs): 0 10*3/uL (ref 0.0–0.1)
Immature Granulocytes: 0 %
Lymphocytes Absolute: 2.3 10*3/uL (ref 0.7–3.1)
Lymphs: 50 %
MCH: 26.6 pg (ref 26.6–33.0)
MCHC: 32.2 g/dL (ref 31.5–35.7)
MCV: 83 fL (ref 79–97)
Monocytes Absolute: 0.5 10*3/uL (ref 0.1–0.9)
Monocytes: 10 %
Neutrophils Absolute: 1.6 10*3/uL (ref 1.4–7.0)
Neutrophils: 34 %
Platelets: 152 10*3/uL (ref 150–450)
RBC: 5.12 x10E6/uL (ref 4.14–5.80)
RDW: 15.1 % (ref 11.6–15.4)
WBC: 4.6 10*3/uL (ref 3.4–10.8)

## 2019-06-16 LAB — LIPID PANEL
Chol/HDL Ratio: 3.7 ratio (ref 0.0–5.0)
Cholesterol, Total: 103 mg/dL (ref 100–199)
HDL: 28 mg/dL — ABNORMAL LOW (ref >39)
LDL Chol Calc (NIH): 60 mg/dL (ref 0–99)
Triglycerides: 70 mg/dL (ref 0–149)
VLDL Cholesterol Cal: 15 mg/dL (ref 5–40)

## 2019-06-16 LAB — CARDIOVASCULAR RISK ASSESSMENT

## 2019-06-16 LAB — TSH: TSH: 4.71 u[IU]/mL — ABNORMAL HIGH (ref 0.450–4.500)

## 2019-06-19 ENCOUNTER — Other Ambulatory Visit: Payer: Self-pay

## 2019-06-19 MED ORDER — LEVOTHYROXINE SODIUM 75 MCG PO TABS: 75.0000 ug | ORAL_TABLET | Freq: Every day | ORAL | 0 refills | Status: DC

## 2019-06-21 ENCOUNTER — Other Ambulatory Visit: Payer: Self-pay

## 2019-06-21 ENCOUNTER — Ambulatory Visit (INDEPENDENT_AMBULATORY_CARE_PROVIDER_SITE_OTHER): Payer: Medicare PPO

## 2019-06-21 DIAGNOSIS — E291 Testicular hypofunction: Secondary | ICD-10-CM | POA: Diagnosis not present

## 2019-06-21 MED ORDER — TESTOSTERONE CYPIONATE 200 MG/ML IM SOLN
200.0000 mg | INTRAMUSCULAR | Status: DC
  Administered 2019-06-21 – 2019-12-27 (×3): 200 mg via INTRAMUSCULAR

## 2019-06-21 NOTE — Progress Notes (Signed)
Testosterone injection given per order, patient tolerated well.

## 2019-06-24 ENCOUNTER — Encounter: Payer: Self-pay | Admitting: Family Medicine

## 2019-06-24 MED ORDER — TAMSULOSIN HCL 0.4 MG PO CAPS: 0.4000 mg | ORAL_CAPSULE | Freq: Every day | ORAL | 3 refills | Status: DC

## 2019-07-05 ENCOUNTER — Other Ambulatory Visit: Payer: Self-pay

## 2019-07-05 ENCOUNTER — Ambulatory Visit (INDEPENDENT_AMBULATORY_CARE_PROVIDER_SITE_OTHER): Payer: Medicare PPO

## 2019-07-05 DIAGNOSIS — E291 Testicular hypofunction: Secondary | ICD-10-CM

## 2019-07-05 MED ORDER — TESTOSTERONE CYPIONATE 200 MG/ML IM SOLN
200.0000 mg | Freq: Once | INTRAMUSCULAR | Status: AC
  Administered 2019-07-05: 200 mg via INTRAMUSCULAR

## 2019-07-05 NOTE — Progress Notes (Signed)
Patient came in for testosterone injection 

## 2019-07-19 ENCOUNTER — Other Ambulatory Visit: Payer: Self-pay

## 2019-07-19 ENCOUNTER — Ambulatory Visit (INDEPENDENT_AMBULATORY_CARE_PROVIDER_SITE_OTHER): Payer: Medicare PPO

## 2019-07-19 DIAGNOSIS — E291 Testicular hypofunction: Secondary | ICD-10-CM | POA: Diagnosis not present

## 2019-07-19 NOTE — Progress Notes (Signed)
     Testosterone injection given per order, patient tolerated well.   Administrations This Visit    testosterone cypionate (DEPOTESTOSTERONE CYPIONATE) injection 200 mg    Admin Date 07/19/2019 Action Given Dose 200 mg Route Intramuscular Administered By Erie Noe, LPN          Erie Noe, LPN [id number] PM

## 2019-08-01 NOTE — Progress Notes (Signed)
Acute Office Visit  Subjective:    Patient ID: Mark Mejia, male    DOB: 04-14-1955, 64 y.o.   MRN: [id number]  Chief Complaint  Patient presents with   Leg Swelling    x 2 weeks. Fluid pills helped some. Has been resting which has helped a lot. Does complain that his ankles and tops of feet are sore/tender    HPI Patient is in today for swelling in legs worsening over the last 2 weeks. He did have two trips to the beach in the last month and increased the salt in his diet. He has compression stockings, but has not been wearing them. Denies orthopnea. Denies chest pain.    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   Hypertrophic scar of skin 09/29/2016   Mixed hyperlipidemia    Peripheral vascular disease, asymptomatic ([place]) 05/23/2017   80-99% stenosis in the left internal coronary artery   Precordial chest pain 12/26/2015   Shortness of breath 12/26/2015   Testicular hypofunction    Vitamin D deficiency     Past Surgical History:  Procedure Laterality Date   GASTROSTOMY W/ FEEDING TUBE     LEFT HEART CATH AND CORONARY ANGIOGRAPHY N/A 05/25/2017   Procedure: LEFT HEART CATH AND CORONARY ANGIOGRAPHY;  Surgeon: Martinique, Peter M, MD;  Location: [facility];  Service: Cardiovascular;  Laterality: N/A;   PORTACATH PLACEMENT     THROAT SURGERY      Family History  Problem Relation Age of Onset   Arthritis Mother    Hypertension Mother    Cancer - Other Maternal Grandmother     Social History   Socioeconomic History   Marital status: Married    Spouse name: Not on file   Number of children: Not on  file   Years of education: Not on file   Highest education level: Not on file  Occupational History   Occupation: Retired  Tobacco Use   Smoking status: Never Smoker   Smokeless tobacco: Never Used  Substance and Sexual Activity   Alcohol use: No   Drug use: No   Sexual activity: Not on file  Other Topics Concern   Not on file  Social History Narrative   Not on file   Social Determinants of Health   Financial Resource Strain:    Difficulty of Paying Living Expenses:   Food Insecurity:    Worried About Charity fundraiser in the Last Year:    Arboriculturist in the Last Year:   Transportation Needs:    Film/video editor (Medical):    Lack of Transportation (Non-Medical):   Physical Activity:    Days of Exercise per Week:    Minutes of Exercise per Session:   Stress:    Feeling of Stress :   Social Connections:    Frequency of Communication with Friends and Family:    Frequency of Social Gatherings with Friends and Family:    Attends Religious Services:    Active Member of Clubs or Organizations:    Attends Archivist Meetings:    Marital Status:  Intimate Partner Violence:    Fear of Current or Ex-Partner:    Emotionally Abused:    Physically Abused:    Sexually Abused:     Outpatient Medications Prior to Visit  Medication Sig Dispense Refill   amLODipine (NORVASC) 5 MG tablet TAKE 1 TABLET BY MOUTH ONCE (1) DAILY 90 tablet 0   aspirin EC 81 MG tablet Take 81 mg by mouth daily.      Glycerin-Hypromellose-PEG 400 ([facility] DRY EYE RELIEF OP) Place 2 drops into both eyes daily as needed (for dry eyes).     HYDROcodone-acetaminophen (NORCO/VICODIN) 5-325 MG tablet Take 1 tablet by mouth every 6 (six) hours as needed for moderate pain. 30 tablet 0   levothyroxine (SYNTHROID) 75 MCG tablet Take 1 tablet (75 mcg total) by mouth daily. 90 tablet 0   meloxicam (MOBIC) 7.5 MG tablet Take 1 tablet (7.5 mg total) by mouth 2 (two)  times daily. 180 tablet 1   nitroGLYCERIN (NITROSTAT) 0.4 MG SL tablet Place 0.4 mg under the tongue every 5 (five) minutes as needed for chest pain.      omeprazole (PRILOSEC) 40 MG capsule TAKE 1 CAPSULE BY MOUTH ONCE (1) DAILY 90 capsule 1   rosuvastatin (CRESTOR) 20 MG tablet Take 1 tablet (20 mg total) by mouth daily. 90 tablet 3   sildenafil (VIAGRA) 100 MG tablet Take 1 tablet (100 mg total) by mouth daily as needed for erectile dysfunction. Take 1/2 to 1 tablet 30-60 minutes prior to intercourse. 10 tablet 5   testosterone cypionate (DEPOTESTOSTERONE CYPIONATE) 200 MG/ML injection Inject 1 mL (200 mg total) into the muscle every 14 (fourteen) days. 6 mL 1   Vitamin D, Ergocalciferol, (DRISDOL) 1.25 MG (50000 UNIT) CAPS capsule Take 1 capsule (50,000 Units total) by mouth every Sunday. 12 capsule 1   furosemide (LASIX) 20 MG tablet Take 1 tablet (20 mg total) by mouth daily. 30 tablet 3   guaiFENesin-codeine 100-10 MG/5ML syrup Take 5 mLs by mouth 3 (three) times daily as needed for cough. 120 mL 0   tamsulosin (FLOMAX) 0.4 MG CAPS capsule Take 1 capsule (0.4 mg total) by mouth daily after supper. 30 capsule 3   Facility-Administered Medications Prior to Visit  Medication Dose Route Frequency Provider Last Rate Last Admin   testosterone cypionate (DEPOTESTOSTERONE CYPIONATE) injection 200 mg  200 mg Intramuscular Q14 Days Levester Waldridge, MD   200 mg at 07/19/19 1512    No Known Allergies  Review of Systems  Constitutional: Negative for chills, diaphoresis, fatigue and fever.  HENT: Negative for congestion, ear pain and sore throat.   Respiratory: Negative for cough and shortness of breath.   Cardiovascular: Positive for leg swelling. Negative for chest pain.       Objective:    Physical Exam Vitals reviewed.  Constitutional:      Appearance: Normal appearance. He is normal weight.  Cardiovascular:     Rate and Rhythm: Normal rate and regular rhythm.  Pulmonary:      Effort: Pulmonary effort is normal.     Breath sounds: Normal breath sounds.  Abdominal:     General: Abdomen is flat. Bowel sounds are normal.     Palpations: Abdomen is soft.  Musculoskeletal:     Right lower leg: Edema present.     Left lower leg: Edema present.     Comments: Negative homen's sign BL  Right leg is slightly worse than her left leg.  Neurological:     Mental Status: He is alert  and oriented to person, place, and time.  Psychiatric:        Mood and Affect: Mood normal.        Behavior: Behavior normal.     BP 128/82    Pulse 72    Temp (!) 97.2 F (36.2 C)    Ht 5\' 11"  (1.803 m)    Wt 220 lb (99.8 kg)    SpO2 99%    BMI 30.68 kg/m  Wt Readings from Last 3 Encounters:  08/02/19 220 lb (99.8 kg)  06/14/19 218 lb 12.8 oz (99.2 kg)  03/21/19 223 lb (101.2 kg)    Health Maintenance Due  Topic Date Due   Hepatitis C Screening  Never done   COVID-19 Vaccine (1) Never done   HIV Screening  Never done   TETANUS/TDAP  Never done   PNA vac Low Risk Adult (1 of 2 - PCV13) Never done    There are no preventive care reminders to display for this patient.   Lab Results  Component Value Date   TSH 4.710 (H) 06/14/2019   Lab Results  Component Value Date   WBC 4.6 06/14/2019   HGB 13.6 06/14/2019   HCT 42.3 06/14/2019   MCV 83 06/14/2019   PLT 152 06/14/2019   Lab Results  Component Value Date   NA 140 06/14/2019   K 4.2 06/14/2019   CO2 24 06/14/2019   GLUCOSE 89 06/14/2019   BUN 9 06/14/2019   CREATININE 1.12 06/14/2019   BILITOT 0.5 06/14/2019   ALKPHOS 55 06/14/2019   AST 39 06/14/2019   ALT 28 06/14/2019   PROT 7.4 06/14/2019   ALBUMIN 4.5 06/14/2019   CALCIUM 9.4 06/14/2019   Lab Results  Component Value Date   CHOL 103 06/14/2019   Lab Results  Component Value Date   HDL 28 (L) 06/14/2019   Lab Results  Component Value Date   LDLCALC 60 06/14/2019   Lab Results  Component Value Date   TRIG 70 06/14/2019   Lab Results   Component Value Date   CHOLHDL 3.7 06/14/2019   No results found for: HGBA1C     Assessment & Plan:  1. Pedal edema Increase Lasix (Furosemide) 20 mg one in am and one before lunch.  Low salt diet.  Elevated at night.  Recommend Compression socks.  Patient to call if starts having muscle cramps and would add potassium chloride 20 mEq daily.  His last potassium was 4.2.  Meds ordered this encounter  Medications   tamsulosin (FLOMAX) 0.4 MG CAPS capsule    Sig: Take 1 capsule (0.4 mg total) by mouth daily after supper.    Dispense:  90 capsule    Refill:  3   furosemide (LASIX) 20 MG tablet    Sig: Take 1 tablet (20 mg total) by mouth 2 (two) times daily for 180 doses.    Dispense:  180 tablet    Refill:  0   Follow-up: Return for Previously scheduled appointment (2 months) and if needed..  An After Visit Summary was printed and given to the patient.  Rochel Brome [facility] [phone]

## 2019-08-02 ENCOUNTER — Ambulatory Visit (INDEPENDENT_AMBULATORY_CARE_PROVIDER_SITE_OTHER): Payer: Medicare PPO | Admitting: Family Medicine

## 2019-08-02 ENCOUNTER — Encounter: Payer: Self-pay | Admitting: Family Medicine

## 2019-08-02 ENCOUNTER — Ambulatory Visit: Payer: Medicare PPO

## 2019-08-02 ENCOUNTER — Other Ambulatory Visit: Payer: Self-pay

## 2019-08-02 VITALS — BP 128/82 | HR 72 | Temp 97.2°F | Ht 71.0 in | Wt 220.0 lb

## 2019-08-02 DIAGNOSIS — R600 Localized edema: Secondary | ICD-10-CM

## 2019-08-02 MED ORDER — TAMSULOSIN HCL 0.4 MG PO CAPS: 0.4000 mg | ORAL_CAPSULE | Freq: Every day | ORAL | 3 refills | Status: DC

## 2019-08-02 MED ORDER — FUROSEMIDE 20 MG PO TABS: 20.0000 mg | ORAL_TABLET | Freq: Two times a day (BID) | ORAL | 0 refills | Status: DC

## 2019-08-02 NOTE — Patient Instructions (Signed)
Increase Lasix (Furosemide) 20 mg one in am and one before lunch.  Low salt diet.  Elevated at night.  Recommend Compression socks.

## 2019-09-05 ENCOUNTER — Other Ambulatory Visit: Payer: Self-pay | Admitting: Cardiology

## 2019-09-15 ENCOUNTER — Other Ambulatory Visit: Payer: Self-pay | Admitting: Family Medicine

## 2019-09-25 ENCOUNTER — Other Ambulatory Visit: Payer: Self-pay

## 2019-09-25 DIAGNOSIS — I6523 Occlusion and stenosis of bilateral carotid arteries: Secondary | ICD-10-CM

## 2019-10-08 ENCOUNTER — Ambulatory Visit (HOSPITAL_COMMUNITY)
Admission: RE | Admit: 2019-10-08 | Discharge: 2019-10-08 | Disposition: A | Discharge status: Home / Self Care | Payer: Medicare PPO | Source: Ambulatory Visit | Attending: Surgery | Admitting: Surgery

## 2019-10-08 ENCOUNTER — Other Ambulatory Visit: Payer: Self-pay

## 2019-10-08 ENCOUNTER — Ambulatory Visit: Payer: Medicare PPO | Admitting: Physician Assistant

## 2019-10-08 VITALS — BP 135/72 | HR 67 | Temp 98.0°F | Resp 20 | Ht 71.0 in | Wt 210.9 lb

## 2019-10-08 DIAGNOSIS — I6523 Occlusion and stenosis of bilateral carotid arteries: Secondary | ICD-10-CM | POA: Diagnosis not present

## 2019-10-08 NOTE — Progress Notes (Addendum)
Office Note     CC:  follow up Requesting Provider:  Rochel Brome, MD  HPI: Mark Mejia is a 64 y.o. (17-Jan-1956) male who presents for routine surveillance of bilateral carotid artery stenosis.  His history is significant for cervical esophageal cancer and underwent chemoradiation.  He was found to have bilateral carotid artery stenosis.  He said no prior or current symptoms of stroke or TIA.  He denies upper or lower extremity weakness, paralysis, slurred speech or monocular blindness.  He is maintained on aspirin and statin. No tobacco use.  Not diabetic.  Past Medical History:  Diagnosis Date  . Angina pectoris ([place]) 05/25/2017  . Atrophy of thyroid   . CAD (coronary artery disease)   . Cancer of cervical esophagus ([place]) 12/06/2012  . Encounter for adjustment and management of vascular access device 03/10/2016  . GERD (gastroesophageal reflux disease)   . Hepatitis C   . History of esophageal cancer   . 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 Hypertrophic scar of skin 09/29/2016  . Mixed hyperlipidemia   . Peripheral vascular disease, asymptomatic ([place]) 05/23/2017   80-99% stenosis in the left internal coronary artery  . Precordial chest pain 12/26/2015  . Shortness of breath 12/26/2015  . Testicular hypofunction   . Vitamin D deficiency     Past Surgical History:  Procedure Laterality Date  . GASTROSTOMY W/ FEEDING TUBE    . LEFT HEART CATH AND CORONARY ANGIOGRAPHY N/A 05/25/2017   Procedure: LEFT HEART CATH AND CORONARY ANGIOGRAPHY;  Surgeon: Martinique, Peter M, MD;  Location: [facility];  Service: Cardiovascular;  Laterality: N/A;  . PORTACATH PLACEMENT    . THROAT SURGERY      Social History   Socioeconomic History  . Marital status: Married    Spouse name: Not on file  . Number of children: Not on file  . Years of education: Not on file  . Highest education level: Not on file  Occupational History  . Occupation: Retired  Tobacco Use  . Smoking status:  Never Smoker  . Smokeless tobacco: Never Used  Vaping Use  . Vaping Use: Never used  Substance and Sexual Activity  . Alcohol use: No  . Drug use: No  . Sexual activity: Not on file  Other Topics Concern  . Not on file  Social History Narrative  . Not on file   Social Determinants of Health   Financial Resource Strain:   . Difficulty of Paying Living Expenses:   Food Insecurity:   . Worried About Charity fundraiser in the Last Year:   . Arboriculturist in the Last Year:   Transportation Needs:   . Film/video editor (Medical):   Marland Kitchen Lack of Transportation (Non-Medical):   Physical Activity:   . Days of Exercise per Week:   . Minutes of Exercise per Session:   Stress:   . Feeling of Stress :   Social Connections:   . Frequency of Communication with Friends and Family:   . Frequency of Social Gatherings with Friends and Family:   . Attends Religious Services:   . Active Member of Clubs or Organizations:   . Attends Archivist Meetings:   Marland Kitchen Marital Status:   Intimate Partner Violence:   . Fear of Current or Ex-Partner:   . Emotionally Abused:   Marland Kitchen Physically Abused:   . Sexually Abused:    Family History  Problem Relation Age of Onset  . Arthritis Mother   .  Hypertension Mother   . Cancer - Other Maternal Grandmother     Current Outpatient Medications  Medication Sig Dispense Refill  . amLODipine (NORVASC) 5 MG tablet TAKE 1 TABLET BY MOUTH ONCE (1) DAILY 90 tablet 0  . aspirin EC 81 MG tablet Take 81 mg by mouth daily.     . furosemide (LASIX) 20 MG tablet Take 1 tablet (20 mg total) by mouth 2 (two) times daily for 180 doses. 180 tablet 0  . Glycerin-Hypromellose-PEG 400 ([facility] DRY EYE RELIEF OP) Place 2 drops into both eyes daily as needed (for dry eyes).    Marland Kitchen HYDROcodone-acetaminophen (NORCO/VICODIN) 5-325 MG tablet Take 1 tablet by mouth every 6 (six) hours as needed for moderate pain. 30 tablet 0  . levothyroxine (SYNTHROID) 75 MCG tablet TAKE 1  TABLET (75 MCG TOTAL) BY MOUTH DAILY. 90 tablet 0  . meloxicam (MOBIC) 7.5 MG tablet Take 1 tablet (7.5 mg total) by mouth 2 (two) times daily. 180 tablet 1  . nitroGLYCERIN (NITROSTAT) 0.4 MG SL tablet Place 0.4 mg under the tongue every 5 (five) minutes as needed for chest pain.     Marland Kitchen omeprazole (PRILOSEC) 40 MG capsule TAKE 1 CAPSULE BY MOUTH ONCE (1) DAILY 90 capsule 1  . rosuvastatin (CRESTOR) 20 MG tablet TAKE 1 TABLET BY MOUTH ONCE (1) DAILY 90 tablet 1  . sildenafil (VIAGRA) 100 MG tablet Take 1 tablet (100 mg total) by mouth daily as needed for erectile dysfunction. Take 1/2 to 1 tablet 30-60 minutes prior to intercourse. 10 tablet 5  . tamsulosin (FLOMAX) 0.4 MG CAPS capsule Take 1 capsule (0.4 mg total) by mouth daily after supper. 90 capsule 3  . testosterone cypionate (DEPOTESTOSTERONE CYPIONATE) 200 MG/ML injection Inject 1 mL (200 mg total) into the muscle every 14 (fourteen) days. 6 mL 1  . Vitamin D, Ergocalciferol, (DRISDOL) 1.25 MG (50000 UNIT) CAPS capsule Take 1 capsule (50,000 Units total) by mouth every Sunday. 12 capsule 1   Current Facility-Administered Medications  Medication Dose Route Frequency Provider Last Rate Last Admin  . testosterone cypionate (DEPOTESTOSTERONE CYPIONATE) injection 200 mg  200 mg Intramuscular Q14 Days Cox, Kirsten, MD   200 mg at 07/19/19 1512    No Known Allergies   REVIEW OF SYSTEMS:   [X]  denotes positive finding, [ ]  denotes negative finding Cardiac  Comments:  Chest pain or chest pressure:    Shortness of breath upon exertion:    Short of breath when lying flat:    Irregular heart rhythm:        Vascular    Pain in calf, thigh, or hip brought on by ambulation:    Pain in feet at night that wakes you up from your sleep:     Blood clot in your veins:    Leg swelling:         Pulmonary    Oxygen at home:    Productive cough:     Wheezing:         Neurologic    Sudden weakness in arms or legs:     Sudden numbness in arms or  legs:     Sudden onset of difficulty speaking or slurred speech:    Temporary loss of vision in one eye:     Problems with dizziness:         Gastrointestinal    Blood in stool:     Vomited blood:         Genitourinary    Burning when  urinating:     Blood in urine:        Psychiatric    Major depression:         Hematologic    Bleeding problems:    Problems with blood clotting too easily:        Skin    Rashes or ulcers:        Constitutional    Fever or chills:      PHYSICAL EXAMINATION:  Vitals:   10/08/19 1447 10/08/19 1450  BP: 132/72 135/72  Pulse: 67   Resp: 20   Temp: 98 F (36.7 C)   TempSrc: Temporal   SpO2: 98%   Weight: 210 lb 14.4 oz (95.7 kg)   Height: 5\' 11"  (1.803 m)     General:  WDWN in NAD; vital signs documented above Gait: No ataxia, unaided HENT: WNL, normocephalic Pulmonary: normal non-labored breathing , without Rales, rhonchi,  wheezing Cardiac: regular HR, without  Murmurs without carotid bruit Abdomen: soft, NT, no masses Skin: without rashes Vascular Exam/Pulses: Lower extremities are warm and well-perfused.  2+ bilateral brachial and radial artery pulses Extremities: without ischemic changes, without Gangrene , without cellulitis; without open wounds;  Musculoskeletal: no muscle wasting or atrophy  Neurologic: A&O X 3;  No focal weakness or paresthesias are detected Psychiatric:  The pt has Normal affect.   Non-Invasive Vascular Imaging:   10/08/2019 Right Carotid: Velocities in the right ICA are consistent with a 40-59% stenosis.   Left Carotid: Velocities in the left ICA are consistent with a 60-79%  stenosis.   Vertebrals: Bilateral vertebral arteries demonstrate antegrade flow.  Subclavians: Normal flow hemodynamics were seen in bilateral subclavian arteries.     ASSESSMENT/PLAN:: 64 y.o. male here for follow up for bilateral carotid artery stenosis.  The patient is asymptomatic.  His duplex study is stable as compared  to March, 2020. Continue aspirin and statin.  We reviewed signs and symptoms of stroke and TIA.  Advised him to seek immediate medical attention should these occur. Recheck in 6 months with follow-up carotid duplex.  Barbie Banner, PA-C [facility] [phone]  Clinic MD:   Trula Slade

## 2019-10-10 ENCOUNTER — Other Ambulatory Visit: Payer: Self-pay | Admitting: *Deleted

## 2019-10-10 DIAGNOSIS — I6523 Occlusion and stenosis of bilateral carotid arteries: Secondary | ICD-10-CM

## 2019-10-18 ENCOUNTER — Ambulatory Visit: Payer: Medicare PPO | Admitting: Cardiology

## 2019-10-18 ENCOUNTER — Encounter: Payer: Self-pay | Admitting: Cardiology

## 2019-10-18 ENCOUNTER — Other Ambulatory Visit: Payer: Self-pay

## 2019-10-18 VITALS — BP 128/70 | HR 66 | Ht 70.0 in | Wt 210.8 lb

## 2019-10-18 DIAGNOSIS — I130 Hypertensive heart and chronic kidney disease with heart failure and stage 1 through stage 4 chronic kidney disease, or unspecified chronic kidney disease: Secondary | ICD-10-CM

## 2019-10-18 DIAGNOSIS — I5083 High output heart failure: Secondary | ICD-10-CM | POA: Diagnosis not present

## 2019-10-18 DIAGNOSIS — I739 Peripheral vascular disease, unspecified: Secondary | ICD-10-CM | POA: Diagnosis not present

## 2019-10-18 DIAGNOSIS — Z8521 Personal history of malignant neoplasm of larynx: Secondary | ICD-10-CM | POA: Diagnosis not present

## 2019-10-18 DIAGNOSIS — R0602 Shortness of breath: Secondary | ICD-10-CM | POA: Diagnosis not present

## 2019-10-18 DIAGNOSIS — R400 Somnolence: Secondary | ICD-10-CM | POA: Diagnosis not present

## 2019-10-18 NOTE — Patient Instructions (Signed)
Medication Instructions:  Your physician recommends that you continue on your current medications as directed. Please refer to the Current Medication list given to you today.  *If you need a refill on your cardiac medications before your next appointment, please call your pharmacy*   Lab Work: None.  If you have labs (blood work) drawn today and your tests are completely normal, you will receive your results only by: Marland Kitchen MyChart Message (if you have MyChart) OR . A paper copy in the mail If you have any lab test that is abnormal or we need to change your treatment, we will call you to review the results.   Testing/Procedures: Your physician has requested that you have an echocardiogram. Echocardiography is a painless test that uses sound waves to create images of your heart. It provides your doctor with information about the size and shape of your heart and how well your heart's chambers and valves are working. This procedure takes approximately one hour. There are no restrictions for this procedure.  Your physician has recommended that you have a sleep study. This test records several body functions during sleep, including: brain activity, eye movement, oxygen and carbon dioxide blood levels, heart rate and rhythm, breathing rate and rhythm, the flow of air through your mouth and nose, snoring, body muscle movements, and chest and belly movement.     Follow-Up: At [facility], you and your health needs are our priority.  As part of our continuing mission to provide you with exceptional heart care, we have created designated Provider Care Teams.  These Care Teams include your primary Cardiologist (physician) and Advanced Practice Providers (APPs -  Physician Assistants and Nurse Practitioners) who all work together to provide you with the care you need, when you need it.  We recommend signing up for the patient portal called "MyChart".  Sign up information is provided on this After Visit  Summary.  MyChart is used to connect with patients for Virtual Visits (Telemedicine).  Patients are able to view lab/test results, encounter notes, upcoming appointments, etc.  Non-urgent messages can be sent to your provider as well.   To learn more about what you can do with MyChart, go to [email or web address].    Your next appointment:   3 month(s)  The format for your next appointment:   In Person  Provider:   Jenne Campus, MD   Other Instructions   Echocardiogram An echocardiogram is a procedure that uses painless sound waves (ultrasound) to produce an image of the heart. Images from an echocardiogram can provide important information about:  Signs of coronary artery disease (CAD).  Aneurysm detection. An aneurysm is a weak or damaged part of an artery wall that bulges out from the normal force of blood pumping through the body.  Heart size and shape. Changes in the size or shape of the heart can be associated with certain conditions, including heart failure, aneurysm, and CAD.  Heart muscle function.  Heart valve function.  Signs of a past heart attack.  Fluid buildup around the heart.  Thickening of the heart muscle.  A tumor or infectious growth around the heart valves. Tell a health care provider about:  Any allergies you have.  All medicines you are taking, including vitamins, herbs, eye drops, creams, and over-the-counter medicines.  Any blood disorders you have.  Any surgeries you have had.  Any medical conditions you have.  Whether you are pregnant or may be pregnant. What are the risks? Generally, this is a safe  procedure. However, problems may occur, including:  Allergic reaction to dye (contrast) that may be used during the procedure. What happens before the procedure? No specific preparation is needed. You may eat and drink normally. What happens during the procedure?   An IV tube may be inserted into one of your veins.  You may  receive contrast through this tube. A contrast is an injection that improves the quality of the pictures from your heart.  A gel will be applied to your chest.  A wand-like tool (transducer) will be moved over your chest. The gel will help to transmit the sound waves from the transducer.  The sound waves will harmlessly bounce off of your heart to allow the heart images to be captured in real-time motion. The images will be recorded on a computer. The procedure may vary among health care providers and hospitals. What happens after the procedure?  You may return to your normal, everyday life, including diet, activities, and medicines, unless your health care provider tells you not to do that. Summary  An echocardiogram is a procedure that uses painless sound waves (ultrasound) to produce an image of the heart.  Images from an echocardiogram can provide important information about the size and shape of your heart, heart muscle function, heart valve function, and fluid buildup around your heart.  You do not need to do anything to prepare before this procedure. You may eat and drink normally.  After the echocardiogram is completed, you may return to your normal, everyday life, unless your health care provider tells you not to do that. This information is not intended to replace advice given to you by your health care provider. Make sure you discuss any questions you have with your health care provider. Document Revised: 06/08/2018 Document Reviewed: 03/20/2016 Elsevier Patient Education  [place].   Sleep Studies A sleep study (polysomnogram) is a series of tests done while you are sleeping. A sleep study records your brain waves, heart rate, breathing rate, oxygen level, and eye and leg movements. A sleep study helps your health care provider: See how well you sleep. Diagnose a sleep disorder. Determine how severe your sleep disorder is. Create a plan to treat your sleep  disorder. Your health care provider may recommend a sleep study if you: Feel sleepy on most days. Snore loudly while sleeping. Have unusual behaviors while you sleep, such as walking. Have brief periods in which you stop breathing during sleep (sleepapnea). Fall asleep suddenly during the day (narcolepsy). Have trouble falling asleep or staying asleep (insomnia). Feel like you need to move your legs when trying to fall asleep (restless legs syndrome). Move your legs by flexing and extending them regularly while asleep (periodic limb movement disorder). Act out your dreams while you sleep (sleep behavior disorder). Feel like you cannot move when you first wake up (sleep paralysis). What tests are part of a sleep study? Most sleep studies record the following during sleep: Brain activity. Eye movements. Heart rate and rhythm. Breathing rate and rhythm. Blood-oxygen level. Blood pressure. Chest and belly movement as you breathe. Arm and leg movements. Snoring or other noises. Body position. Where are sleep studies done? Sleep studies are done at sleep centers. A sleep center may be inside a hospital, office, or clinic. The room where you have the study may look like a hospital room or a hotel room. The health care providers doing the study may come in and out of the room during the study. Most of  the time, they will be in another room monitoring your test as you sleep. How are sleep studies done? Most sleep studies are done during a normal period of time for a full night of sleep. You will arrive at the study center in the evening and go home in the morning. Before the test Bring your pajamas and toothbrush with you to the sleep study. Do not have caffeine on the day of your sleep study. Do not drink alcohol on the day of your sleep study. Your health care provider will let you know if you should stop taking any of your regular medicines before the test. During the test     Round,  sticky patches with sensors attached to recording wires (electrodes) are placed on your scalp, face, chest, and limbs. Wires from all the electrodes and sensors run from your bed to a computer. The wires can be taken off and put back on if you need to get out of bed to go to the bathroom. A sensor is placed over your nose to measure airflow. A finger clip is put on your finger or ear to measure your blood oxygen level (pulse oximetry). A belt is placed around your belly and a belt is placed around your chest to measure breathing movements. If you have signs of the sleep disorder called sleep apnea during your test, you may get a treatment mask to wear for the second half of the night. The mask provides positive airway pressure (PAP) to help you breathe better during sleep. This may greatly improve your sleep apnea. You will then have all tests done again with the mask in place to see if your measurements and recordings change. After the test A medical doctor who specializes in sleep will evaluate the results of your sleep study and share them with you and your primary health care provider. Based on your results, your medical history, and a physical exam, you may be diagnosed with a sleep disorder, such as: Sleep apnea. Restless legs syndrome. Sleep-related behavior disorder. Sleep-related movement disorders. Sleep-related seizure disorders. Your health care team will help determine your treatment options based on your diagnosis. This may include: Improving your sleep habits (sleep hygiene). Wearing a continuous positive airway pressure (CPAP) or bi-level positive airway pressure (BPAP) mask. Wearing an oral device at night to improve breathing and reduce snoring. Taking medicines. Follow these instructions at home: Take over-the-counter and prescription medicines only as told by your health care provider. If you are instructed to use a CPAP or BPAP mask, make sure you use it nightly as  directed. Make any lifestyle changes that your health care provider recommends. If you were given a device to open your airway while you sleep, use it only as told by your health care provider. Do not use any tobacco products, such as cigarettes, chewing tobacco, and e-cigarettes. If you need help quitting, ask your health care provider. Keep all follow-up visits as told by your health care provider. This is important. Summary A sleep study (polysomnogram) is a series of tests done while you are sleeping. It shows how well you sleep. Most sleep studies are done over one full night of sleep. You will arrive at the study center in the evening and go home in the morning. If you have signs of the sleep disorder called sleep apnea during your test, you may get a treatment mask to wear for the second half of the night. A medical doctor who specializes in sleep will evaluate  the results of your sleep study and share them with your primary health care provider. This information is not intended to replace advice given to you by your health care provider. Make sure you discuss any questions you have with your health care provider. Document Revised: 08/02/2018 Document Reviewed: 03/15/2017 Elsevier Patient Education  [place].

## 2019-10-18 NOTE — Progress Notes (Signed)
Cardiology Office Note:    Date:  10/18/2019   ID:  Mark Mejia, DOB 1955-06-26, MRN [id number]  PCP:  Mark Brome, MD  Cardiologist:  Jenne Campus, MD    Referring MD: Mark Brome, MD   No chief complaint on file. Have swollen legs  History of Present Illness:    Mark Mejia is a 64 y.o. male with past medical history significant for atypical chest pain that eventually led to cardiac catheterization which showed normal coronaries.  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  For last few months he has been experiencing swelling of lower extremities.  He was taking diuretic however the dose was increased in spite of that there is still some swelling of lower extremities that is why he was referred to [place].  He also complained of having fatigue tiredness and shortness of breath.  On more specific questioning I realized that he snores a lot during the night and he thinks he stopped breathing at night.  On top of that during the day it is very easy for him to fall asleep watching TV.  Past Medical History:  Diagnosis Date  . Angina pectoris ([place]) 05/25/2017  . Atrophy of thyroid   . CAD (coronary artery disease)   . 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 Encounter for adjustment and management of vascular access device 03/10/2016  . GERD (gastroesophageal reflux disease)   . Hepatitis C   . History of esophageal cancer   . 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 Hypertrophic scar of skin 09/29/2016  . Mixed hyperlipidemia   . Peripheral vascular disease, asymptomatic ([place]) 05/23/2017   80-99% stenosis in the left internal coronary artery  . Precordial chest pain 12/26/2015  . Shortness of breath 12/26/2015  . Testicular hypofunction   . Vitamin D deficiency     Past Surgical History:  Procedure Laterality Date  . GASTROSTOMY W/ FEEDING TUBE    . LEFT  HEART CATH AND CORONARY ANGIOGRAPHY N/A 05/25/2017   Procedure: LEFT HEART CATH AND CORONARY ANGIOGRAPHY;  Surgeon: Martinique, Peter M, MD;  Location: [facility];  Service: Cardiovascular;  Laterality: N/A;  . PORTACATH PLACEMENT    . THROAT SURGERY      Current Medications: Current Meds  Medication Sig  . amLODipine (NORVASC) 5 MG tablet TAKE 1 TABLET BY MOUTH ONCE (1) DAILY  . aspirin EC 81 MG tablet Take 81 mg by mouth daily.   . furosemide (LASIX) 20 MG tablet Take 1 tablet (20 mg total) by mouth 2 (two) times daily for 180 doses.  . Glycerin-Hypromellose-PEG 400 ([facility] DRY EYE RELIEF OP) Place 2 drops into both eyes daily as needed (for dry eyes).  Marland Kitchen HYDROcodone-acetaminophen (NORCO/VICODIN) 5-325 MG tablet Take 1 tablet by mouth every 6 (six) hours as needed for moderate pain.  Marland Kitchen levothyroxine (SYNTHROID) 75 MCG tablet TAKE 1 TABLET (75 MCG TOTAL) BY MOUTH DAILY.  . meloxicam (MOBIC) 7.5 MG tablet Take 1 tablet (7.5 mg total) by mouth 2 (two) times daily.  . nitroGLYCERIN (NITROSTAT) 0.4 MG SL tablet Place 0.4 mg under the tongue every 5 (five) minutes as needed for chest pain.   Marland Kitchen omeprazole (PRILOSEC) 40 MG capsule TAKE 1 CAPSULE BY MOUTH ONCE (1) DAILY  . rosuvastatin (CRESTOR) 20 MG tablet TAKE 1 TABLET BY MOUTH ONCE (1) DAILY  . sildenafil (VIAGRA) 100 MG tablet Take 1 tablet (  100 mg total) by mouth daily as needed for erectile dysfunction. Take 1/2 to 1 tablet 30-60 minutes prior to intercourse.  . tamsulosin (FLOMAX) 0.4 MG CAPS capsule Take 1 capsule (0.4 mg total) by mouth daily after supper.  . testosterone cypionate (DEPOTESTOSTERONE CYPIONATE) 200 MG/ML injection Inject 1 mL (200 mg total) into the muscle every 14 (fourteen) days.  . Vitamin D, Ergocalciferol, (DRISDOL) 1.25 MG (50000 UNIT) CAPS capsule Take 1 capsule (50,000 Units total) by mouth every Sunday.   Current Facility-Administered Medications for the 10/18/19 encounter (Office Visit) with Park Liter, MD   Medication  . testosterone cypionate (DEPOTESTOSTERONE CYPIONATE) injection 200 mg     Allergies:   Patient has no known allergies.   Social History   Socioeconomic History  . Marital status: Married    Spouse name: Not on file  . Number of children: Not on file  . Years of education: Not on file  . Highest education level: Not on file  Occupational History  . Occupation: Retired  Tobacco Use  . Smoking status: Never Smoker  . Smokeless tobacco: Never Used  Vaping Use  . Vaping Use: Never used  Substance and Sexual Activity  . Alcohol use: No  . Drug use: No  . Sexual activity: Not on file  Other Topics Concern  . Not on file  Social History Narrative  . Not on file   Social Determinants of Health   Financial Resource Strain:   . Difficulty of Paying Living Expenses: Not on file  Food Insecurity:   . Worried About Charity fundraiser in the Last Year: Not on file  . Ran Out of Food in the Last Year: Not on file  Transportation Needs:   . Lack of Transportation (Medical): Not on file  . Lack of Transportation (Non-Medical): Not on file  Physical Activity:   . Days of Exercise per Week: Not on file  . Minutes of Exercise per Session: Not on file  Stress:   . Feeling of Stress : Not on file  Social Connections:   . Frequency of Communication with Friends and Family: Not on file  . Frequency of Social Gatherings with Friends and Family: Not on file  . Attends Religious Services: Not on file  . Active Member of Clubs or Organizations: Not on file  . Attends Archivist Meetings: Not on file  . Marital Status: Not on file     Family History: The patient's family history includes Arthritis in his mother; Cancer - Other in his maternal grandmother; Hypertension in his mother. ROS:   Please see the history of present illness.    All 14 point review of systems negative except as described per history of present illness  EKGs/Labs/Other Studies Reviewed:       Recent Labs: 06/14/2019: ALT 28; BUN 9; Creatinine, Ser 1.12; Hemoglobin 13.6; Platelets 152; Potassium 4.2; Sodium 140; TSH 4.710  Recent Lipid Panel    Component Value Date/Time   CHOL 103 06/14/2019 1029   TRIG 70 06/14/2019 1029   HDL 28 (L) 06/14/2019 1029   CHOLHDL 3.7 06/14/2019 1029   LDLCALC 60 06/14/2019 1029    Physical Exam:    VS:  BP 128/70   Pulse 66   Ht 5\' 10"  (1.778 m)   Wt 210 lb 12.8 oz (95.6 kg)   SpO2 97%   BMI 30.25 kg/m     Wt Readings from Last 3 Encounters:  10/18/19 210 lb 12.8 oz (95.6  kg)  10/08/19 210 lb 14.4 oz (95.7 kg)  08/02/19 220 lb (99.8 kg)     GEN:  Well nourished, well developed in no acute distress HEENT: Normal NECK: No JVD; No carotid bruits LYMPHATICS: No lymphadenopathy CARDIAC: RRR, no murmurs, no rubs, no gallops RESPIRATORY:  Clear to auscultation without rales, wheezing or rhonchi  ABDOMEN: Soft, non-tender, non-distended MUSCULOSKELETAL:  No edema; No deformity  SKIN: Warm and dry LOWER EXTREMITIES: no swelling NEUROLOGIC:  Alert and oriented x 3 PSYCHIATRIC:  Normal affect   ASSESSMENT:    1. Shortness of breath   2. History of laryngeal cancer   3. 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 Peripheral vascular disease, asymptomatic ([place])   5. Daytime somnolence    PLAN:    In order of problems listed above:  1. Shortness of breath: Most likely multifactorial.  I will repeat his echocardiogram to recheck left ventricle ejection fraction.  I am doing this also because of swelling of lower extremities.  He does take amlodipine 5 mg which contribute to this however I will not alter this medication right now until we have echocardiogram done.  His legs are better with higher dose of diuretic but he may require it slightly bigger dose will wait for results of echocardiogram to decide 2. History of laryngeal cancer.  That seems to be  stable. 3. Peripheral vascular disease: Did follow-up by vascular group from [place]. 4. Excessive daytime somnolence.  We will schedule him to have a sleep study.   Medication Adjustments/Labs and Tests Ordered: Current medicines are reviewed at length with the patient today.  Concerns regarding medicines are outlined above.  Orders Placed This Encounter  Procedures  . ECHOCARDIOGRAM COMPLETE  . Split night study   Medication changes: No orders of the defined types were placed in this encounter.   Signed, Park Liter, MD, [facility] 10/18/2019 4:53 PM    [place]

## 2019-10-22 ENCOUNTER — Telehealth: Payer: Self-pay | Admitting: *Deleted

## 2019-10-22 NOTE — Telephone Encounter (Signed)
PA request sent to [facility] for in lab sleep study.

## 2019-11-09 ENCOUNTER — Ambulatory Visit (INDEPENDENT_AMBULATORY_CARE_PROVIDER_SITE_OTHER): Payer: Medicare PPO

## 2019-11-09 ENCOUNTER — Other Ambulatory Visit: Payer: Self-pay

## 2019-11-09 DIAGNOSIS — R0602 Shortness of breath: Secondary | ICD-10-CM | POA: Diagnosis not present

## 2019-11-09 NOTE — Progress Notes (Signed)
Complete echocardiogram has been performed.  Jimmy Jerelyn Trimarco RDCS, RVT 

## 2019-11-12 LAB — ECHOCARDIOGRAM COMPLETE
Area-P 1/2: 3.99 cm2
S' Lateral: 3.3 cm

## 2019-11-16 ENCOUNTER — Telehealth: Payer: Self-pay | Admitting: *Deleted

## 2019-11-16 NOTE — Telephone Encounter (Signed)
Left message to check Mychart account to get sleep study appointment details. If he needs to reschedule appointment, sleep lab's contact information left.

## 2019-11-22 ENCOUNTER — Encounter (HOSPITAL_BASED_OUTPATIENT_CLINIC_OR_DEPARTMENT_OTHER): Payer: Medicare PPO | Admitting: Cardiovascular Disease

## 2019-12-11 ENCOUNTER — Other Ambulatory Visit: Payer: Self-pay | Admitting: Family Medicine

## 2019-12-14 ENCOUNTER — Other Ambulatory Visit: Payer: Self-pay

## 2019-12-14 ENCOUNTER — Ambulatory Visit (INDEPENDENT_AMBULATORY_CARE_PROVIDER_SITE_OTHER): Payer: Medicare PPO | Admitting: Family Medicine

## 2019-12-14 ENCOUNTER — Encounter: Payer: Self-pay | Admitting: Family Medicine

## 2019-12-14 VITALS — BP 122/70 | HR 61 | Temp 97.6°F | Ht 70.0 in | Wt 212.2 lb

## 2019-12-14 DIAGNOSIS — Z8521 Personal history of malignant neoplasm of larynx: Secondary | ICD-10-CM | POA: Diagnosis not present

## 2019-12-14 DIAGNOSIS — I5083 High output heart failure: Secondary | ICD-10-CM | POA: Diagnosis not present

## 2019-12-14 DIAGNOSIS — E782 Mixed hyperlipidemia: Secondary | ICD-10-CM

## 2019-12-14 DIAGNOSIS — R351 Nocturia: Secondary | ICD-10-CM | POA: Diagnosis not present

## 2019-12-14 DIAGNOSIS — Z23 Encounter for immunization: Secondary | ICD-10-CM | POA: Diagnosis not present

## 2019-12-14 DIAGNOSIS — I130 Hypertensive heart and chronic kidney disease with heart failure and stage 1 through stage 4 chronic kidney disease, or unspecified chronic kidney disease: Secondary | ICD-10-CM

## 2019-12-14 DIAGNOSIS — I6522 Occlusion and stenosis of left carotid artery: Secondary | ICD-10-CM

## 2019-12-14 DIAGNOSIS — E291 Testicular hypofunction: Secondary | ICD-10-CM

## 2019-12-14 DIAGNOSIS — N401 Enlarged prostate with lower urinary tract symptoms: Secondary | ICD-10-CM

## 2019-12-14 DIAGNOSIS — I10 Essential (primary) hypertension: Secondary | ICD-10-CM

## 2019-12-14 MED ORDER — GUAIFENESIN-CODEINE 100-10 MG/5ML PO SOLN
5.0000 mL | Freq: Three times a day (TID) | ORAL | 0 refills | Status: DC | PRN
Start: 2019-12-14 — End: 2020-07-17

## 2019-12-14 MED ORDER — GUAIFENESIN-CODEINE 100-10 MG/5ML PO SOLN: 5.0000 mL | Freq: Three times a day (TID) | ORAL | 0 refills | Status: DC | PRN

## 2019-12-14 NOTE — Progress Notes (Signed)
Established Patient Office Visit  Subjective:  Patient ID: Mark Mejia, male    DOB: 03-Sep-1955  Age: 64 y.o. MRN: [id number]  CC:  Chief Complaint  Patient presents with  . Hyperlipidemia    HPI Patient to be evaluated for atrophy of thyroid (acquired).  Date of diagnosis 08/14/2014.  He is currently taking Synthroid, 75 mcg daily.  TSH was abnormal at his last visit.  It was 4.710.  His Synthroid was increased from 50 mcg daily.Marland Kitchen  He denies any related symptoms.    Patient has GERD. He is currently on omeprazole 40 mg once daily. Past medical history is pertinent for Esophageal cancer/throat cancer.  His oncologist is Dr. Bobby Rumpf.  His plastic surgeon is Dr. Harlow Mares.    Patient has history of hypertensive atherosclerosis in the form of carotid artery disease.  He follows with vascular surgeon Dr. Donzetta Matters annually. Shelden denies history of prior myocardial infarction.  The course of the disease has been stable.  Currently, his treatment regimen consists of daily 81 mg aspirin,  Norvasc, and Crestor.  A cardiac cath has been done previously in 04/2017 which showed normal coronaries. Sou is compliant with low fat diet, low salt diet, and taking medications as recommended and prescribed. Prior echocardiogram showed a normal ejection fraction, but mild diastolic dysfunction.  And carotid dopplers were initially done when he was receiving chemoradiation for cervical esophageal cancer.  At [place] left carotid was 80 to 90% stenosis of his internal carotid artery.  On repeat ultrasound done in vascular surgeons office it was determined to be 60 to 79%. Dr. Donzetta Matters was seen 04/2018 and he was supposed to have his carotid monitored every 12 months. He is overdue going to call to make his appointment.    Pt presents with hyperlipidemia.  Date of diagnosis 2019.  Current treatment includes Crestor 20 mg daily.  Compliance with treatment has been good; he takes his medication as directed, maintains his  low cholesterol diet, follows up as directed, and maintains his exercise regimen.  He has hypogonadism with low testosterone level.  He feels that his dose may not be high enough. He is requesting a refill of the testosterone and Viagra.  Patient has a history of cervical esophageal cancer in 2017 treated with chemoradiation.   Past Medical History:  Diagnosis Date  . Angina pectoris ([place]) 05/25/2017  . Atrophy of thyroid   . CAD (coronary artery disease)   . 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 Encounter for adjustment and management of vascular access device 03/10/2016  . GERD (gastroesophageal reflux disease)   . Hepatitis C   . History of esophageal cancer   . 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 Hypertrophic scar of skin 09/29/2016  . Mixed hyperlipidemia   . Peripheral vascular disease, asymptomatic ([place]) 05/23/2017   80-99% stenosis in the left internal coronary artery  . Precordial chest pain 12/26/2015  . Shortness of breath 12/26/2015  . Testicular hypofunction   . Vitamin D deficiency     Past Surgical History:  Procedure Laterality Date  . GASTROSTOMY W/ FEEDING TUBE    . LEFT HEART CATH AND CORONARY ANGIOGRAPHY N/A 05/25/2017   Procedure: LEFT HEART CATH AND CORONARY ANGIOGRAPHY;  Surgeon: Martinique, Peter M, MD;  Location: [facility];  Service: Cardiovascular;  Laterality: N/A;  . PORTACATH PLACEMENT    . THROAT SURGERY      Family History  Problem Relation Age of Onset  .  Arthritis Mother   . Hypertension Mother   . Cancer - Other Maternal Grandmother     Social History   Socioeconomic History  . Marital status: Married    Spouse name: Not on file  . Number of children: Not on file  . Years of education: Not on file  . Highest education level: Not on file  Occupational History  . Occupation: Retired  Tobacco Use  . Smoking status: Never Smoker  . Smokeless tobacco: Never Used  Vaping Use  . Vaping Use: Never used  Substance and  Sexual Activity  . Alcohol use: No  . Drug use: No  . Sexual activity: Not on file  Other Topics Concern  . Not on file  Social History Narrative  . Not on file   Social Determinants of Health   Financial Resource Strain:   . Difficulty of Paying Living Expenses: Not on file  Food Insecurity:   . Worried About Charity fundraiser in the Last Year: Not on file  . Ran Out of Food in the Last Year: Not on file  Transportation Needs:   . Lack of Transportation (Medical): Not on file  . Lack of Transportation (Non-Medical): Not on file  Physical Activity:   . Days of Exercise per Week: Not on file  . Minutes of Exercise per Session: Not on file  Stress:   . Feeling of Stress : Not on file  Social Connections:   . Frequency of Communication with Friends and Family: Not on file  . Frequency of Social Gatherings with Friends and Family: Not on file  . Attends Religious Services: Not on file  . Active Member of Clubs or Organizations: Not on file  . Attends Archivist Meetings: Not on file  . Marital Status: Not on file  Intimate Partner Violence:   . Fear of Current or Ex-Partner: Not on file  . Emotionally Abused: Not on file  . Physically Abused: Not on file  . Sexually Abused: Not on file    Outpatient Medications Prior to Visit  Medication Sig Dispense Refill  . amLODipine (NORVASC) 5 MG tablet TAKE 1 TABLET BY MOUTH ONCE (1) DAILY 90 tablet 0  . aspirin EC 81 MG tablet Take 81 mg by mouth daily.     . furosemide (LASIX) 20 MG tablet TAKE 1 TABLET BY MOUTH TWICE(2) DAILY 180 tablet 0  . Glycerin-Hypromellose-PEG 400 ([facility] DRY EYE RELIEF OP) Place 2 drops into both eyes daily as needed (for dry eyes).    Marland Kitchen HYDROcodone-acetaminophen (NORCO/VICODIN) 5-325 MG tablet Take 1 tablet by mouth every 6 (six) hours as needed for moderate pain. 30 tablet 0  . levothyroxine (SYNTHROID) 75 MCG tablet TAKE 1 TABLET BY MOUTH ONCE DAILY 90 tablet 0  . meloxicam (MOBIC) 7.5 MG tablet  Take 1 tablet (7.5 mg total) by mouth 2 (two) times daily. 180 tablet 1  . nitroGLYCERIN (NITROSTAT) 0.4 MG SL tablet Place 0.4 mg under the tongue every 5 (five) minutes as needed for chest pain.     Marland Kitchen omeprazole (PRILOSEC) 40 MG capsule TAKE 1 CAPSULE BY MOUTH ONCE (1) DAILY 90 capsule 1  . rosuvastatin (CRESTOR) 20 MG tablet TAKE 1 TABLET BY MOUTH ONCE (1) DAILY 90 tablet 1  . sildenafil (VIAGRA) 100 MG tablet Take 1 tablet (100 mg total) by mouth daily as needed for erectile dysfunction. Take 1/2 to 1 tablet 30-60 minutes prior to intercourse. 10 tablet 5  . testosterone cypionate (DEPOTESTOSTERONE CYPIONATE)  200 MG/ML injection Inject 1 mL (200 mg total) into the muscle every 14 (fourteen) days. 6 mL 1  . Vitamin D, Ergocalciferol, (DRISDOL) 1.25 MG (50000 UNIT) CAPS capsule TAKE 1 CAPSULE (50,000 UNITS TOTAL) BY MOUTH EVERY SUNDAY. 12 capsule 1  . tamsulosin (FLOMAX) 0.4 MG CAPS capsule Take 1 capsule (0.4 mg total) by mouth daily after supper. 90 capsule 3   Facility-Administered Medications Prior to Visit  Medication Dose Route Frequency Provider Last Rate Last Admin  . testosterone cypionate (DEPOTESTOSTERONE CYPIONATE) injection 200 mg  200 mg Intramuscular Q14 Days Gurtha Picker, MD   200 mg at 07/19/19 1512    No Known Allergies  ROS Review of Systems  Constitutional: Negative for chills, diaphoresis, fatigue and fever.  HENT: Negative for congestion, ear pain and sore throat.   Respiratory: Negative for cough and shortness of breath.   Cardiovascular: Negative for chest pain and leg swelling.  Gastrointestinal: Negative for abdominal pain, constipation, diarrhea, nausea and vomiting.  Endocrine: Negative for polydipsia, polyphagia and polyuria.  Genitourinary: Negative for dysuria and urgency.       Nocturia  Musculoskeletal: Positive for back pain. Negative for arthralgias and myalgias.       DDD. Takes hydrocodone/apap. Meloxicam.  Neurological: Negative for dizziness,  numbness and headaches.  Psychiatric/Behavioral: Positive for sleep disturbance (nocturia - wakes up and can't go back to sleep. ). Negative for dysphoric mood.      Objective:    Physical Exam Constitutional:      Appearance: He is well-developed.  Neck:     Comments: No carotid bruits. Cardiovascular:     Rate and Rhythm: Normal rate and regular rhythm.     Heart sounds: Normal heart sounds.  Pulmonary:     Effort: Pulmonary effort is normal.     Breath sounds: Normal breath sounds.  Abdominal:     General: Bowel sounds are normal.     Palpations: Abdomen is soft.     Tenderness: There is abdominal tenderness.  Neurological:     Mental Status: He is alert and oriented to person, place, and time.  Psychiatric:        Behavior: Behavior normal.     BP 122/70 (BP Location: Left Arm, Patient Position: Sitting, Cuff Size: Normal)   Pulse 61   Temp 97.6 F (36.4 C) (Temporal)   Ht 5\' 10"  (1.778 m)   Wt 212 lb 3.2 oz (96.3 kg)   SpO2 99%   BMI 30.45 kg/m  Wt Readings from Last 3 Encounters:  12/14/19 212 lb 3.2 oz (96.3 kg)  10/18/19 210 lb 12.8 oz (95.6 kg)  10/08/19 210 lb 14.4 oz (95.7 kg)     Health Maintenance Due  Topic Date Due  . Hepatitis C Screening  Never done  . COVID-19 Vaccine (1) Never done  . HIV Screening  Never done  . TETANUS/TDAP  Never done  . PNA vac Low Risk Adult (1 of 2 - PCV13) Never done    There are no preventive care reminders to display for this patient.  Lab Results  Component Value Date   TSH 3.130 12/14/2019   Lab Results  Component Value Date   WBC 4.2 12/14/2019   HGB 12.9 (L) 12/14/2019   HCT 39.8 12/14/2019   MCV 82 12/14/2019   PLT 139 (L) 12/14/2019   Lab Results  Component Value Date   NA 139 12/14/2019   K 4.0 12/14/2019   CO2 23 12/14/2019   GLUCOSE 88 12/14/2019  BUN 14 12/14/2019   CREATININE 1.19 12/14/2019   BILITOT 0.5 12/14/2019   ALKPHOS 60 12/14/2019   AST 32 12/14/2019   ALT 23 12/14/2019    PROT 7.6 12/14/2019   ALBUMIN 4.5 12/14/2019   CALCIUM 9.5 12/14/2019   Lab Results  Component Value Date   CHOL 105 12/14/2019   Lab Results  Component Value Date   HDL 28 (L) 12/14/2019   Lab Results  Component Value Date   LDLCALC 62 12/14/2019   Lab Results  Component Value Date   TRIG 68 12/14/2019   Lab Results  Component Value Date   CHOLHDL 3.8 12/14/2019   No results found for: HGBA1C    Assessment & Plan:   1. Mixed hyperlipidemia Well controlled.  No changes to medicines.  Continue to work on eating a healthy diet and exercise.  Labs drawn today.  - Lipid panel  2. Hypertension Well controlled.  No changes to medicines.  Continue to work on eating a healthy diet and exercise.  Labs drawn today.  - Comprehensive metabolic panel - CBC with Differential/Platelet  3. Atrophy of thyroid Check lab. - TSH  4. Male hypogonadism - Testosterone,Free and Total  5. History of esophageal/laryngeal cancer. - completed treatment and as of March/2020 he was released from oncology considered to be cancer free!  6 Left carotid stenosis Patient to call and set up an appointment with Dr. Donzetta Matters  Follow-up: Return in about 6 months (around 06/13/2020) for fasting.    Rochel Brome, MD  .Tami Ribas

## 2019-12-15 LAB — LIPID PANEL
Chol/HDL Ratio: 3.8 ratio (ref 0.0–5.0)
Cholesterol, Total: 105 mg/dL (ref 100–199)
HDL: 28 mg/dL — ABNORMAL LOW (ref >39)
LDL Chol Calc (NIH): 62 mg/dL (ref 0–99)
Triglycerides: 68 mg/dL (ref 0–149)
VLDL Cholesterol Cal: 15 mg/dL (ref 5–40)

## 2019-12-15 LAB — CBC WITH DIFF/PLATELET
Basophils Absolute: 0 10*3/uL (ref 0.0–0.2)
Basos: 1 %
EOS (ABSOLUTE): 0.1 10*3/uL (ref 0.0–0.4)
Eos: 3 %
Hematocrit: 39.8 % (ref 37.5–51.0)
Hemoglobin: 12.9 g/dL — ABNORMAL LOW (ref 13.0–17.7)
Immature Grans (Abs): 0 10*3/uL (ref 0.0–0.1)
Immature Granulocytes: 0 %
Lymphocytes Absolute: 1.9 10*3/uL (ref 0.7–3.1)
Lymphs: 45 %
MCH: 26.4 pg — ABNORMAL LOW (ref 26.6–33.0)
MCHC: 32.4 g/dL (ref 31.5–35.7)
MCV: 82 fL (ref 79–97)
Monocytes Absolute: 0.4 10*3/uL (ref 0.1–0.9)
Monocytes: 10 %
Neutrophils Absolute: 1.7 10*3/uL (ref 1.4–7.0)
Neutrophils: 41 %
Platelets: 139 10*3/uL — ABNORMAL LOW (ref 150–450)
RBC: 4.88 x10E6/uL (ref 4.14–5.80)
RDW: 15.6 % — ABNORMAL HIGH (ref 11.6–15.4)
WBC: 4.2 10*3/uL (ref 3.4–10.8)

## 2019-12-15 LAB — COMPREHENSIVE METABOLIC PANEL
ALT: 23 IU/L (ref 0–44)
AST: 32 IU/L (ref 0–40)
Albumin/Globulin Ratio: 1.5 (ref 1.2–2.2)
Albumin: 4.5 g/dL (ref 3.8–4.8)
Alkaline Phosphatase: 60 IU/L (ref 44–121)
BUN/Creatinine Ratio: 12 (ref 10–24)
BUN: 14 mg/dL (ref 8–27)
Bilirubin Total: 0.5 mg/dL (ref 0.0–1.2)
CO2: 23 mmol/L (ref 20–29)
Calcium: 9.5 mg/dL (ref 8.6–10.2)
Chloride: 102 mmol/L (ref 96–106)
Creatinine, Ser: 1.19 mg/dL (ref 0.76–1.27)
GFR calc Af Amer: 74 mL/min/{1.73_m2} (ref >59)
GFR calc non Af Amer: 64 mL/min/{1.73_m2} (ref >59)
Globulin, Total: 3.1 g/dL (ref 1.5–4.5)
Glucose: 88 mg/dL (ref 65–99)
Potassium: 4 mmol/L (ref 3.5–5.2)
Sodium: 139 mmol/L (ref 134–144)
Total Protein: 7.6 g/dL (ref 6.0–8.5)

## 2019-12-15 LAB — TSH: TSH: 3.13 u[IU]/mL (ref 0.450–4.500)

## 2019-12-15 LAB — CARDIOVASCULAR RISK ASSESSMENT

## 2019-12-23 ENCOUNTER — Encounter: Payer: Self-pay | Admitting: Family Medicine

## 2019-12-27 ENCOUNTER — Other Ambulatory Visit: Payer: Self-pay

## 2019-12-27 ENCOUNTER — Ambulatory Visit (INDEPENDENT_AMBULATORY_CARE_PROVIDER_SITE_OTHER): Payer: Medicare PPO

## 2019-12-27 DIAGNOSIS — E291 Testicular hypofunction: Secondary | ICD-10-CM | POA: Diagnosis not present

## 2019-12-27 DIAGNOSIS — C159 Malignant neoplasm of esophagus, unspecified: Secondary | ICD-10-CM | POA: Diagnosis not present

## 2019-12-27 DIAGNOSIS — R1310 Dysphagia, unspecified: Secondary | ICD-10-CM | POA: Diagnosis not present

## 2019-12-27 DIAGNOSIS — Z01818 Encounter for other preprocedural examination: Secondary | ICD-10-CM | POA: Diagnosis not present

## 2019-12-28 NOTE — Progress Notes (Signed)
   Testosterone injection given per order, patient tolerated well.   Erie Noe, LPN 8:98 AM

## 2020-01-01 ENCOUNTER — Ambulatory Visit (INDEPENDENT_AMBULATORY_CARE_PROVIDER_SITE_OTHER): Payer: Medicare PPO | Admitting: Family Medicine

## 2020-01-01 ENCOUNTER — Encounter: Payer: Self-pay | Admitting: Family Medicine

## 2020-01-01 ENCOUNTER — Other Ambulatory Visit: Payer: Self-pay

## 2020-01-01 VITALS — BP 120/72 | HR 70 | Temp 97.9°F | Ht 70.0 in | Wt 211.0 lb

## 2020-01-01 DIAGNOSIS — Z0000 Encounter for general adult medical examination without abnormal findings: Secondary | ICD-10-CM

## 2020-01-01 NOTE — Patient Instructions (Signed)
Continue specialty follow up with vascular and cardiology for continued monitoring  Keep appointment for EGD-esophageal dilation

## 2020-01-01 NOTE — Progress Notes (Signed)
Subjective:   Mark Mejia is a 64 y.o. male who presents for an  [facility] Visit. Wellness visits previously at his home Review of Systems      Objective:    Today's Vitals   01/01/20 1512  BP: 120/72  Pulse: 70  Temp: 97.9 F (36.6 C)  TempSrc: Temporal  SpO2: 100%  Weight: 211 lb (95.7 kg)  Height: 5\' 10"  (1.778 m)   Body mass index is 30.28 kg/m.  Advanced Directives 01/01/2020 06/13/2019 05/05/2018 05/25/2017  Does Patient Have a Medical Advance Directive? Yes Yes No No;Yes  Type of Advance Directive Living will [place];Living will - [place];Living will  Does patient want to make changes to medical advance directive? Yes (Inpatient - patient defers changing a medical advance directive and declines information at this time) - - No - Patient declined  Copy of [place] in Chart? - - - No - copy requested  Would patient like information on creating a medical advance directive? - - No - Patient declined -    Current Medications (verified) Outpatient Encounter Medications as of 01/01/2020  Medication Sig  . amLODipine (NORVASC) 5 MG tablet TAKE 1 TABLET BY MOUTH ONCE (1) DAILY  . aspirin EC 81 MG tablet Take 81 mg by mouth daily.   . furosemide (LASIX) 20 MG tablet TAKE 1 TABLET BY MOUTH TWICE(2) DAILY  . Glycerin-Hypromellose-PEG 400 ([facility] DRY EYE RELIEF OP) Place 2 drops into both eyes daily as needed (for dry eyes).  Marland Kitchen guaiFENesin-codeine 100-10 MG/5ML syrup Take 5 mLs by mouth 3 (three) times daily as needed for cough.  Marland Kitchen HYDROcodone-acetaminophen (NORCO/VICODIN) 5-325 MG tablet Take 1 tablet by mouth every 6 (six) hours as needed for moderate pain.  Marland Kitchen levothyroxine (SYNTHROID) 75 MCG tablet TAKE 1 TABLET BY MOUTH ONCE DAILY  . meloxicam (MOBIC) 7.5 MG tablet Take 1 tablet (7.5 mg total) by mouth 2 (two) times daily.  . nitroGLYCERIN (NITROSTAT) 0.4 MG SL tablet Place 0.4 mg under the tongue every 5  (five) minutes as needed for chest pain.   Marland Kitchen omeprazole (PRILOSEC) 40 MG capsule TAKE 1 CAPSULE BY MOUTH ONCE (1) DAILY  . rosuvastatin (CRESTOR) 20 MG tablet TAKE 1 TABLET BY MOUTH ONCE (1) DAILY  . sildenafil (VIAGRA) 100 MG tablet Take 1 tablet (100 mg total) by mouth daily as needed for erectile dysfunction. Take 1/2 to 1 tablet 30-60 minutes prior to intercourse.  . testosterone cypionate (DEPOTESTOSTERONE CYPIONATE) 200 MG/ML injection Inject 1 mL (200 mg total) into the muscle every 14 (fourteen) days.  . Vitamin D, Ergocalciferol, (DRISDOL) 1.25 MG (50000 UNIT) CAPS capsule TAKE 1 CAPSULE (50,000 UNITS TOTAL) BY MOUTH EVERY SUNDAY.   Facility-Administered Encounter Medications as of 01/01/2020  Medication  . testosterone cypionate (DEPOTESTOSTERONE CYPIONATE) injection 200 mg    Allergies (verified) Patient has no known allergies.   History: Past Medical History:  Diagnosis Date  . Angina pectoris ([place]) 05/25/2017  . Atrophy of thyroid   . CAD (coronary artery disease)   . 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 Encounter for adjustment and management of vascular access device 03/10/2016  . GERD (gastroesophageal reflux disease)   . Hepatitis C   . History of esophageal cancer   . 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 Hypertrophic scar of skin 09/29/2016  . Mixed hyperlipidemia   . Peripheral vascular disease, asymptomatic ([place]) 05/23/2017   80-99% stenosis in the left internal  coronary artery  . Precordial chest pain 12/26/2015  . Shortness of breath 12/26/2015  . Testicular hypofunction   . Vitamin D deficiency    Past Surgical History:  Procedure Laterality Date  . GASTROSTOMY W/ FEEDING TUBE    . LEFT HEART CATH AND CORONARY ANGIOGRAPHY N/A 05/25/2017   Procedure: LEFT HEART CATH AND CORONARY ANGIOGRAPHY;  Surgeon: Martinique, Peter M, MD;  Location: [facility];  Service: Cardiovascular;  Laterality: N/A;  . PORTACATH PLACEMENT    . THROAT SURGERY      Family History  Problem Relation Age of Onset  . Arthritis Mother   . Hypertension Mother   . Cancer - Other Maternal Grandmother    Social History   Socioeconomic History  . Marital status: Married    Spouse name: Not on file  . Number of children: Not on file  . Years of education: Not on file  . Highest education level: Not on file  Occupational History  . Occupation: Retired  Tobacco Use  . Smoking status: Never Smoker  . Smokeless tobacco: Never Used  Vaping Use  . Vaping Use: Never used  Substance and Sexual Activity  . Alcohol use: No  . Drug use: No  . Sexual activity: Not on file  Other Topics Concern  . Not on file  Social History Narrative  . Not on file   Social Determinants of Health   Financial Resource Strain:   . Difficulty of Paying Living Expenses: Not on file  Food Insecurity:   . Worried About Charity fundraiser in the Last Year: Not on file  . Ran Out of Food in the Last Year: Not on file  Transportation Needs:   . Lack of Transportation (Medical): Not on file  . Lack of Transportation (Non-Medical): Not on file  Physical Activity:   . Days of Exercise per Week: Not on file  . Minutes of Exercise per Session: Not on file  Stress:   . Feeling of Stress : Not on file  Social Connections:   . Frequency of Communication with Friends and Family: Not on file  . Frequency of Social Gatherings with Friends and Family: Not on file  . Attends Religious Services: Not on file  . Active Member of Clubs or Organizations: Not on file  . Attends Archivist Meetings: Not on file  . 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 N N  Vision? N N  Difficulty concentrating  or making decisions? N N  Walking or climbing stairs? N Y  Dressing or bathing? N N  Doing errands, shopping? N N  Preparing Food and eating ? N -  Using the Toilet? N -  In the past six months, have you accidently leaked urine? N -  Do you have problems with loss of bowel control? N -  Managing your Medications? N -  Managing your Finances? N -  Housekeeping or managing your Housekeeping? N -  Some recent data might be hidden    Patient Care Team: Rochel Brome, MD as PCP - General (Family Medicine) Cardiology-Dr.Krawasky  ENT-Dr. Wandra Feinstein GI-Dr.Mezenheimer-11/01-2020-endoscopy- EGD/colonoscopy- esophageal CA-chemo/radiation/surgery-esophageal dilation Vascular surgery-Dr. Serita Butcher  ultrasound    Assessment:   This is a routine wellness examination for [place].  Hearing/Vision screen-wear glasses for reading, no hearing aids  Dietary issues and exercise activities discussed: Current Exercise Habits: Home exercise routine, Type of exercise: walking, Time (Minutes): 10, Frequency (Times/Week): 2, Weekly Exercise (Minutes/Week): 20 Feeding tube post radiation/chemo due to difficulty swallowing  Depression Screen PHQ 2/9 Scores 01/01/2020 08/02/2019  PHQ - 2 Score 0 0    Fall Risk Fall Risk  01/01/2020 08/02/2019  Falls in the past year? 0 0  Number falls in past yr: 0 0  Injury with Fall? 0 0  Follow up - Falls prevention discussed;Education provided;Falls evaluation completed    Any stairs in or around the home? yes Handrails on basement stairs, handrails to get into house  Home free of loose throw rugs in walkways-in hallway Adequate lighting in your home to reduce risk of falls? Adequate lighting in home and entry  [place]:  Life alert? no Use of a cane, walker or w/c?cane Grab bars in the bathroom? no Shower chair or bench in shower? handle   TIMED UP AND GO: No difficulty with walking-swelling episodically in legs-uses  cane Cognitive Function: no difficulty with memory   Immunizations Immunization History  Administered Date(s) Administered  . Fluad Quad(high Dose 65+) 12/14/2019  . Influenza-Unspecified 12/01/2018  . Tdap 05/31/2011   Immunizations UTD Screening Tests Health Maintenance  Topic Date Due  . Hepatitis C Screening  Never done  . COVID-19 Vaccine (1) Never done  . HIV Screening  Never done  . PNA vac Low Risk Adult (1 of 2 - PCV13) Never done  . TETANUS/TDAP  05/30/2021  . COLONOSCOPY  01/14/2028  . INFLUENZA VACCINE  Completed   Today's Vitals   01/01/20 1512  BP: 120/72  Pulse: 70  Temp: 97.9 F (36.6 C)  TempSrc: Temporal  SpO2: 100%  Weight: 211 lb (95.7 kg)  Height: 5\' 10"  (1.778 m)   Body mass index is 30.28 kg/m. Health Maintenance  Health Maintenance Due  Topic Date Due  . Hepatitis C Screening  Never done  . COVID-19 Vaccine (1) Never done  . HIV Screening  Never done  . PNA vac Low Risk Adult (1 of 2 - PCV13) Never done    Lung Cancer Screening: no tob use    Vision Screening: Recommended annual ophthalmology exams for early detection of glaucoma and other disorders of the eye. Is the patient up to date with their annual eye exam?no-COVID Who is the provider or what is the name of the office in which the patient attends annual eye exams? [facility] [place]: Recommended annual dental exams for proper oral hygiene   Plan:    1. Encounter for annual wellness exam in Medicare patient   I have personally reviewed and noted the following in the patient's chart:   . Medical and social history . Use of alcohol, tobacco or illicit drugs  . Current medications and supplements . Functional ability and status . Nutritional status . Physical activity . Advanced directives . List of other physicians . Hospitalizations, surgeries, and ER visits in previous 12 months . Vitals . Screenings to include cognitive, depression, and  falls . Referrals and appointments  In addition, I have reviewed and discussed with patient certain preventive protocols, quality metrics, and best practice recommendations. A written personalized care plan for preventive services as well as general preventive health recommendations were provided to patient.  Mertha Baars, MD   01/01/2020

## 2020-01-04 DIAGNOSIS — Z1159 Encounter for screening for other viral diseases: Secondary | ICD-10-CM | POA: Diagnosis not present

## 2020-01-11 DIAGNOSIS — K222 Esophageal obstruction: Secondary | ICD-10-CM | POA: Diagnosis not present

## 2020-01-11 DIAGNOSIS — R1310 Dysphagia, unspecified: Secondary | ICD-10-CM | POA: Diagnosis not present

## 2020-01-11 DIAGNOSIS — K449 Diaphragmatic hernia without obstruction or gangrene: Secondary | ICD-10-CM | POA: Diagnosis not present

## 2020-01-11 DIAGNOSIS — I2510 Atherosclerotic heart disease of native coronary artery without angina pectoris: Secondary | ICD-10-CM | POA: Diagnosis not present

## 2020-01-11 DIAGNOSIS — Z8501 Personal history of malignant neoplasm of esophagus: Secondary | ICD-10-CM | POA: Diagnosis not present

## 2020-01-11 DIAGNOSIS — C159 Malignant neoplasm of esophagus, unspecified: Secondary | ICD-10-CM | POA: Diagnosis not present

## 2020-01-17 DIAGNOSIS — E559 Vitamin D deficiency, unspecified: Secondary | ICD-10-CM | POA: Insufficient documentation

## 2020-01-17 DIAGNOSIS — E291 Testicular hypofunction: Secondary | ICD-10-CM | POA: Insufficient documentation

## 2020-01-17 DIAGNOSIS — I2510 Atherosclerotic heart disease of native coronary artery without angina pectoris: Secondary | ICD-10-CM | POA: Insufficient documentation

## 2020-01-17 DIAGNOSIS — K219 Gastro-esophageal reflux disease without esophagitis: Secondary | ICD-10-CM | POA: Insufficient documentation

## 2020-01-17 DIAGNOSIS — B1920 Unspecified viral hepatitis C without hepatic coma: Secondary | ICD-10-CM | POA: Insufficient documentation

## 2020-01-17 DIAGNOSIS — Z8501 Personal history of malignant neoplasm of esophagus: Secondary | ICD-10-CM | POA: Insufficient documentation

## 2020-01-18 ENCOUNTER — Ambulatory Visit: Payer: Medicare PPO | Admitting: Cardiology

## 2020-01-18 ENCOUNTER — Encounter: Payer: Self-pay | Admitting: Cardiology

## 2020-01-18 ENCOUNTER — Other Ambulatory Visit: Payer: Self-pay

## 2020-01-18 VITALS — BP 121/76 | HR 66 | Ht 70.0 in | Wt 213.0 lb

## 2020-01-18 DIAGNOSIS — I2510 Atherosclerotic heart disease of native coronary artery without angina pectoris: Secondary | ICD-10-CM | POA: Diagnosis not present

## 2020-01-18 DIAGNOSIS — E782 Mixed hyperlipidemia: Secondary | ICD-10-CM

## 2020-01-18 DIAGNOSIS — I739 Peripheral vascular disease, unspecified: Secondary | ICD-10-CM

## 2020-01-18 DIAGNOSIS — R079 Chest pain, unspecified: Secondary | ICD-10-CM

## 2020-01-18 NOTE — Patient Instructions (Signed)
Medication Instructions:  °Your physician recommends that you continue on your current medications as directed. Please refer to the Current Medication list given to you today. ° °*If you need a refill on your cardiac medications before your next appointment, please call your pharmacy* ° ° °Lab Work: °None ordered  ° °If you have labs (blood work) drawn today and your tests are completely normal, you will receive your results only by: °MyChart Message (if you have MyChart) OR °A paper copy in the mail °If you have any lab test that is abnormal or we need to change your treatment, we will call you to review the results. ° ° °Testing/Procedures: °None ordered  ° ° °Follow-Up: °At [facility], you and your health needs are our priority.  As part of our continuing mission to provide you with exceptional heart care, we have created designated Provider Care Teams.  These Care Teams include your primary Cardiologist (physician) and Advanced Practice Providers (APPs -  Physician Assistants and Nurse Practitioners) who all work together to provide you with the care you need, when you need it. ° °We recommend signing up for the patient portal called "MyChart".  Sign up information is provided on this After Visit Summary.  MyChart is used to connect with patients for Virtual Visits (Telemedicine).  Patients are able to view lab/test results, encounter notes, upcoming appointments, etc.  Non-urgent messages can be sent to your provider as well.   °To learn more about what you can do with MyChart, go to [email or web address].   ° °Your next appointment:   °6 month(s) ° °The format for your next appointment:   °In Person ° °Provider:   °Robert Krasowski, MD  ° ° °Other Instructions °None   °

## 2020-01-18 NOTE — Progress Notes (Signed)
Cardiology Office Note:    Date:  01/18/2020   ID:  Mark Mejia, DOB August 17, 1955, MRN [id number]  PCP:  Rochel Brome, MD  Cardiologist:  Jenne Campus, MD    Referring MD: Rochel Brome, MD   No chief complaint on file. I am doing better  History of Present Illness:    Mark Mejia is a 64 y.o. 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  Comes today to my for follow-up overall doing well complain of having some pain in the left side of his neck not related to exercise.  He is trying to be a little more active.  Last time I seen him he was complaining having some swelling of lower extremities but that improved now he wear some elastic stockings and swelling completely subsided.  Past Medical History:  Diagnosis Date  . Angina pectoris ([place]) 05/25/2017  . Atrophy of thyroid   . Benign prostatic hyperplasia with nocturia 06/14/2019  . CAD (coronary artery disease)   . 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 Chest pain of uncertain etiology 2/87/8676  . Encounter for adjustment and management of vascular access device 03/10/2016  . Encounter for annual wellness exam in Medicare patient 03/10/2016  . GERD (gastroesophageal reflux disease)   . Hepatitis C   . History of esophageal cancer   . 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 Hypertrophic scar of skin 09/29/2016  . 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  . Mixed hyperlipidemia   . Peripheral vascular disease, asymptomatic ([place]) 05/23/2017   80-99% stenosis in the left internal coronary artery  . Precordial chest pain 12/26/2015  . Shortness of breath 12/26/2015   . Testicular hypofunction   . Vitamin D deficiency     Past Surgical History:  Procedure Laterality Date  . GASTROSTOMY W/ FEEDING TUBE    . LEFT HEART CATH AND CORONARY ANGIOGRAPHY N/A 05/25/2017   Procedure: LEFT HEART CATH AND CORONARY ANGIOGRAPHY;  Surgeon: Martinique, Peter M, MD;  Location: [facility];  Service: Cardiovascular;  Laterality: N/A;  . PORTACATH PLACEMENT    . THROAT SURGERY      Current Medications: Current Meds  Medication Sig  . amLODipine (NORVASC) 5 MG tablet TAKE 1 TABLET BY MOUTH ONCE (1) DAILY  . aspirin EC 81 MG tablet Take 81 mg by mouth daily.   . furosemide (LASIX) 20 MG tablet TAKE 1 TABLET BY MOUTH TWICE(2) DAILY  . Glycerin-Hypromellose-PEG 400 ([facility] DRY EYE RELIEF OP) Place 2 drops into both eyes daily as needed (for dry eyes).  Marland Kitchen guaiFENesin-codeine 100-10 MG/5ML syrup Take 5 mLs by mouth 3 (three) times daily as needed for cough.  Marland Kitchen HYDROcodone-acetaminophen (NORCO/VICODIN) 5-325 MG tablet Take 1 tablet by mouth every 6 (six) hours as needed for moderate pain.  Marland Kitchen levothyroxine (SYNTHROID) 75 MCG tablet TAKE 1 TABLET BY MOUTH ONCE DAILY  . meloxicam (MOBIC) 7.5 MG tablet Take 1 tablet (7.5 mg total) by mouth 2 (two) times daily.  . nitroGLYCERIN (NITROSTAT) 0.4 MG SL tablet Place 0.4 mg under the tongue every 5 (five) minutes as needed for chest pain.   Marland Kitchen omeprazole (PRILOSEC) 40 MG capsule TAKE 1 CAPSULE BY MOUTH ONCE (1) DAILY  .  rosuvastatin (CRESTOR) 20 MG tablet TAKE 1 TABLET BY MOUTH ONCE (1) DAILY  . sildenafil (VIAGRA) 100 MG tablet Take 1 tablet (100 mg total) by mouth daily as needed for erectile dysfunction. Take 1/2 to 1 tablet 30-60 minutes prior to intercourse.  . testosterone cypionate (DEPOTESTOSTERONE CYPIONATE) 200 MG/ML injection Inject 1 mL (200 mg total) into the muscle every 14 (fourteen) days.  . Vitamin D, Ergocalciferol, (DRISDOL) 1.25 MG (50000 UNIT) CAPS capsule TAKE 1 CAPSULE (50,000 UNITS TOTAL) BY MOUTH EVERY SUNDAY.    Current Facility-Administered Medications for the 01/18/20 encounter (Office Visit) with Park Liter, MD  Medication  . testosterone cypionate (DEPOTESTOSTERONE CYPIONATE) injection 200 mg     Allergies:   Patient has no known allergies.   Social History   Socioeconomic History  . Marital status: Married    Spouse name: Not on file  . Number of children: Not on file  . Years of education: Not on file  . Highest education level: Not on file  Occupational History  . Occupation: Retired  Tobacco Use  . Smoking status: Never Smoker  . Smokeless tobacco: Never Used  Vaping Use  . Vaping Use: Never used  Substance and Sexual Activity  . Alcohol use: No  . Drug use: No  . Sexual activity: Not on file  Other Topics Concern  . Not on file  Social History Narrative  . Not on file   Social Determinants of Health   Financial Resource Strain:   . Difficulty of Paying Living Expenses: Not on file  Food Insecurity:   . Worried About Charity fundraiser in the Last Year: Not on file  . Ran Out of Food in the Last Year: Not on file  Transportation Needs:   . Lack of Transportation (Medical): Not on file  . Lack of Transportation (Non-Medical): Not on file  Physical Activity:   . Days of Exercise per Week: Not on file  . Minutes of Exercise per Session: Not on file  Stress:   . Feeling of Stress : Not on file  Social Connections:   . Frequency of Communication with Friends and Family: Not on file  . Frequency of Social Gatherings with Friends and Family: Not on file  . Attends Religious Services: Not on file  . Active Member of Clubs or Organizations: Not on file  . Attends Archivist Meetings: Not on file  . Marital Status: Not on file     Family History: The patient's family history includes Arthritis in his mother; Cancer - Other in his maternal grandmother; Hypertension in his mother. ROS:   Please see the history of present illness.    All 14 point  review of systems negative except as described per history of present illness  EKGs/Labs/Other Studies Reviewed:      Recent Labs: 12/14/2019: ALT 23; BUN 14; Creatinine, Ser 1.19; Hemoglobin 12.9; Platelets 139; Potassium 4.0; Sodium 139; TSH 3.130  Recent Lipid Panel    Component Value Date/Time   CHOL 105 12/14/2019 1050   TRIG 68 12/14/2019 1050   HDL 28 (L) 12/14/2019 1050   CHOLHDL 3.8 12/14/2019 1050   LDLCALC 62 12/14/2019 1050    Physical Exam:    VS:  BP 121/76   Pulse 66   Ht 5\' 10"  (1.778 m)   Wt 213 lb (96.6 kg)   SpO2 98%   BMI 30.56 kg/m     Wt Readings from Last 3 Encounters:  01/18/20 213 lb (96.6  kg)  01/01/20 211 lb (95.7 kg)  12/14/19 212 lb 3.2 oz (96.3 kg)     GEN:  Well nourished, well developed in no acute distress HEENT: Normal NECK: No JVD; No carotid bruits LYMPHATICS: No lymphadenopathy CARDIAC: RRR, no murmurs, no rubs, no gallops RESPIRATORY:  Clear to auscultation without rales, wheezing or rhonchi  ABDOMEN: Soft, non-tender, non-distended MUSCULOSKELETAL:  No edema; No deformity  SKIN: Warm and dry LOWER EXTREMITIES: no swelling NEUROLOGIC:  Alert and oriented x 3 PSYCHIATRIC:  Normal affect   ASSESSMENT:    1. Coronary artery disease involving native coronary artery of native heart without angina pectoris   2. Peripheral vascular disease, asymptomatic (HCC)   3. Chest pain of uncertain etiology   4. Mixed hyperlipidemia    PLAN:    In order of problems listed above:  1. Coronary disease: Actually cardiac catheterization done in 2019 showed normal coronaries.  He does not have any symptomatology that would suggest some issues.  We will continue risk factors modifications. 2. Peripheral vascular disease in form of carotic type 48-year-old disease which is noncritical, and does not require any intervention right now.  The key is risk factors modifications, he is on aspirin as well as high intense statin which I will  continue. 3. Dyslipidemia I did review his K PN his LDL was 62 HDL 28 it is on Crestor 20 which is high intense statin I will continue for now. 4. Swelling of lower extremities multifactorial however now improved he is on diuretic which I will continue. 5. Dyspnea on exertion he is trying to be to be more active which improved his conditioning.   Medication Adjustments/Labs and Tests Ordered: Current medicines are reviewed at length with the patient today.  Concerns regarding medicines are outlined above.  No orders of the defined types were placed in this encounter.  Medication changes: No orders of the defined types were placed in this encounter.   Signed, Park Liter, MD, [facility] 01/18/2020 3:37 PM    [facility]

## 2020-02-19 ENCOUNTER — Ambulatory Visit (INDEPENDENT_AMBULATORY_CARE_PROVIDER_SITE_OTHER): Payer: Medicare PPO | Admitting: Family Medicine

## 2020-02-19 ENCOUNTER — Other Ambulatory Visit: Payer: Self-pay

## 2020-02-19 ENCOUNTER — Encounter: Payer: Self-pay | Admitting: Family Medicine

## 2020-02-19 VITALS — BP 120/68 | HR 71 | Temp 96.9°F | Resp 16 | Ht 70.0 in | Wt 210.0 lb

## 2020-02-19 DIAGNOSIS — M5440 Lumbago with sciatica, unspecified side: Secondary | ICD-10-CM

## 2020-02-19 MED ORDER — TIZANIDINE HCL 4 MG PO TABS: 4.0000 mg | ORAL_TABLET | Freq: Four times a day (QID) | ORAL | 0 refills | Status: DC | PRN

## 2020-02-19 MED ORDER — PREDNISONE 50 MG PO TABS: 50.0000 mg | ORAL_TABLET | Freq: Every day | ORAL | 0 refills | Status: DC

## 2020-02-19 NOTE — Progress Notes (Signed)
Acute Office Visit  Subjective:    Patient ID: Mark Mejia, male    DOB: 02-17-1956, 64 y.o.   MRN: [id number]  Chief Complaint  Patient presents with  . Pain    Pain in bilateral feet and legs, started about 2-3 weeks. States he has not taken anything for pain. States skin is darker on lower legs and feet.     HPI Patient is in today for pain on his both legs and feet since 2-3 weeks ago. He has noticed change of color on his lower legs and feet. Feels like it is in muscles versus joints. Hurts at rest but worsens with ambulation.  No history of similar pain. Burns down BL legs at different times. Has a history of herniated disc 2016 approximately. No injury.   Past Medical History:  Diagnosis Date  . Angina pectoris ([place]) 05/25/2017  . Atrophy of thyroid   . Benign prostatic hyperplasia with nocturia 06/14/2019  . CAD (coronary artery disease)   . 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 Chest pain of uncertain etiology [id number]  . Encounter for adjustment and management of vascular access device 03/10/2016  . Encounter for annual wellness exam in Medicare patient 03/10/2016  . GERD (gastroesophageal reflux disease)   . Hepatitis C   . History of esophageal cancer   . 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 Hypertrophic scar of skin 09/29/2016  . 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  . Mixed hyperlipidemia   . Peripheral vascular disease, asymptomatic ([place]) 05/23/2017   80-99% stenosis in the left internal coronary artery  . Precordial chest pain 12/26/2015  . Shortness of breath 12/26/2015  . Testicular hypofunction   . Vitamin D deficiency     Past Surgical History:  Procedure Laterality Date  . GASTROSTOMY W/ FEEDING TUBE    . LEFT HEART CATH AND CORONARY ANGIOGRAPHY N/A 05/25/2017   Procedure: LEFT HEART CATH AND CORONARY ANGIOGRAPHY;  Surgeon: Martinique, Peter M, MD;   Location: [facility];  Service: Cardiovascular;  Laterality: N/A;  . PORTACATH PLACEMENT    . THROAT SURGERY      Family History  Problem Relation Age of Onset  . Arthritis Mother   . Hypertension Mother   . Cancer - Other Maternal Grandmother     Social History   Socioeconomic History  . Marital status: Married    Spouse name: Not on file  . Number of children: Not on file  . Years of education: Not on file  . Highest education level: Not on file  Occupational History  . Occupation: Retired  Tobacco Use  . Smoking status: Never Smoker  . Smokeless tobacco: Never Used  Vaping Use  . Vaping Use: Never used  Substance and Sexual Activity  . Alcohol use: No  . Drug use: No  . Sexual activity: Not on file  Other Topics Concern  . Not on file  Social History Narrative  . Not on file   Social Determinants of Health   Financial Resource Strain: Not on file  Food Insecurity: Not on file  Transportation Needs: Not on file  Physical Activity: Not on file  Stress: Not on file  Social Connections: Not on file  Intimate Partner Violence: Not on file    Outpatient Medications Prior to Visit  Medication Sig Dispense Refill  . amLODipine (NORVASC) 5 MG tablet TAKE 1 TABLET BY MOUTH ONCE (  1) DAILY 90 tablet 0  . aspirin EC 81 MG tablet Take 81 mg by mouth daily.     . furosemide (LASIX) 20 MG tablet TAKE 1 TABLET BY MOUTH TWICE(2) DAILY 180 tablet 0  . Glycerin-Hypromellose-PEG 400 ([facility] DRY EYE RELIEF OP) Place 2 drops into both eyes daily as needed (for dry eyes).    Marland Kitchen guaiFENesin-codeine 100-10 MG/5ML syrup Take 5 mLs by mouth 3 (three) times daily as needed for cough. 120 mL 0  . HYDROcodone-acetaminophen (NORCO/VICODIN) 5-325 MG tablet Take 1 tablet by mouth every 6 (six) hours as needed for moderate pain. 30 tablet 0  . levothyroxine (SYNTHROID) 75 MCG tablet TAKE 1 TABLET BY MOUTH ONCE DAILY 90 tablet 0  . meloxicam (MOBIC) 7.5 MG tablet Take 1 tablet (7.5 mg  total) by mouth 2 (two) times daily. 180 tablet 1  . nitroGLYCERIN (NITROSTAT) 0.4 MG SL tablet Place 0.4 mg under the tongue every 5 (five) minutes as needed for chest pain.     Marland Kitchen omeprazole (PRILOSEC) 40 MG capsule TAKE 1 CAPSULE BY MOUTH ONCE (1) DAILY 90 capsule 1  . rosuvastatin (CRESTOR) 20 MG tablet TAKE 1 TABLET BY MOUTH ONCE (1) DAILY 90 tablet 1  . sildenafil (VIAGRA) 100 MG tablet Take 1 tablet (100 mg total) by mouth daily as needed for erectile dysfunction. Take 1/2 to 1 tablet 30-60 minutes prior to intercourse. 10 tablet 5  . testosterone cypionate (DEPOTESTOSTERONE CYPIONATE) 200 MG/ML injection Inject 1 mL (200 mg total) into the muscle every 14 (fourteen) days. 6 mL 1  . Vitamin D, Ergocalciferol, (DRISDOL) 1.25 MG (50000 UNIT) CAPS capsule TAKE 1 CAPSULE (50,000 UNITS TOTAL) BY MOUTH EVERY SUNDAY. 12 capsule 1   Facility-Administered Medications Prior to Visit  Medication Dose Route Frequency Provider Last Rate Last Admin  . testosterone cypionate (DEPOTESTOSTERONE CYPIONATE) injection 200 mg  200 mg Intramuscular Q14 Days Charon Smedberg, MD   200 mg at 12/27/19 1530    No Known Allergies  Review of Systems  Constitutional: Negative for chills, fatigue and fever.  HENT: Negative for ear pain and sore throat.   Respiratory: Negative for apnea, cough and shortness of breath.   Cardiovascular: Negative for chest pain.  Gastrointestinal: Negative for abdominal pain, diarrhea and nausea.  Musculoskeletal: Positive for myalgias. Negative for back pain.       Pain on both lower legs and feet.  Skin: Positive for color change.  Neurological: Positive for headaches.       Objective:    Physical Exam Vitals reviewed.  Constitutional:      Appearance: Normal appearance.  Cardiovascular:     Rate and Rhythm: Normal rate and regular rhythm.     Pulses: Normal pulses.     Heart sounds: Normal heart sounds.  Pulmonary:     Effort: Pulmonary effort is normal.     Breath  sounds: Normal breath sounds. No wheezing, rhonchi or rales.  Musculoskeletal:        General: Tenderness (Lumbar region bilaterally) present. No deformity.     Comments: Mild discomfort bilaterally with straight leg raise.  Neurological:     Mental Status: He is alert.  Psychiatric:        Mood and Affect: Mood normal.        Behavior: Behavior normal.     BP 120/68 (BP Location: Left Arm, Patient Position: Sitting, Cuff Size: Normal)   Pulse 71   Temp (!) 96.9 F (36.1 C) (Temporal)   Resp 16  Ht 5\' 10"  (1.778 m)   Wt 210 lb (95.3 kg)   SpO2 97%   BMI 30.13 kg/m  Wt Readings from Last 3 Encounters:  02/19/20 210 lb (95.3 kg)  01/18/20 213 lb (96.6 kg)  01/01/20 211 lb (95.7 kg)    Health Maintenance Due  Topic Date Due  . COVID-19 Vaccine (1) Never done  . HIV Screening  Never done  . PNA vac Low Risk Adult (1 of 2 - PCV13) Never done    There are no preventive care reminders to display for this patient.   Lab Results  Component Value Date   TSH 3.130 12/14/2019   Lab Results  Component Value Date   WBC 4.2 12/14/2019   HGB 12.9 (L) 12/14/2019   HCT 39.8 12/14/2019   MCV 82 12/14/2019   PLT 139 (L) 12/14/2019   Lab Results  Component Value Date   NA 139 12/14/2019   K 4.0 12/14/2019   CO2 23 12/14/2019   GLUCOSE 88 12/14/2019   BUN 14 12/14/2019   CREATININE 1.19 12/14/2019   BILITOT 0.5 12/14/2019   ALKPHOS 60 12/14/2019   AST 32 12/14/2019   ALT 23 12/14/2019   PROT 7.6 12/14/2019   ALBUMIN 4.5 12/14/2019   CALCIUM 9.5 12/14/2019   Lab Results  Component Value Date   CHOL 105 12/14/2019   Lab Results  Component Value Date   HDL 28 (L) 12/14/2019   Lab Results  Component Value Date   LDLCALC 62 12/14/2019   Lab Results  Component Value Date   TRIG 68 12/14/2019   Lab Results  Component Value Date   CHOLHDL 3.8 12/14/2019   No results found for: HGBA1C     Assessment & Plan:  1. Back pain of lumbar region with sciatica -  predniSONE (DELTASONE) 50 MG tablet; Take 1 tablet (50 mg total) by mouth daily with breakfast. Stop meloxicam while on prednisone.  Dispense: 5 tablet; Refill: 0 - tiZANidine (ZANAFLEX) 4 MG tablet; Take 1 tablet (4 mg total) by mouth every 6 (six) hours as needed for muscle spasms.  Dispense: 30 tablet; Refill: 0 - DG Lumbar Spine Complete    Meds ordered this encounter  Medications  . predniSONE (DELTASONE) 50 MG tablet    Sig: Take 1 tablet (50 mg total) by mouth daily with breakfast. Stop meloxicam while on prednisone.    Dispense:  5 tablet    Refill:  0  . tiZANidine (ZANAFLEX) 4 MG tablet    Sig: Take 1 tablet (4 mg total) by mouth every 6 (six) hours as needed for muscle spasms.    Dispense:  30 tablet    Refill:  0    Orders Placed This Encounter  Procedures  . DG Lumbar Spine Complete     Follow-up: Return in about 4 weeks (around 03/18/2020) for lumbar pain.  An After Visit Summary was printed and given to the patient.  Rochel Brome, MD [facility] [phone]

## 2020-02-27 ENCOUNTER — Encounter: Payer: Self-pay | Admitting: Family Medicine

## 2020-03-04 ENCOUNTER — Other Ambulatory Visit: Payer: Self-pay | Admitting: Family Medicine

## 2020-04-15 ENCOUNTER — Other Ambulatory Visit: Payer: Self-pay | Admitting: Family Medicine

## 2020-04-15 ENCOUNTER — Encounter: Payer: Self-pay | Admitting: Family Medicine

## 2020-04-15 DIAGNOSIS — M5440 Lumbago with sciatica, unspecified side: Secondary | ICD-10-CM

## 2020-04-19 ENCOUNTER — Other Ambulatory Visit: Payer: Self-pay | Admitting: Family Medicine

## 2020-04-19 DIAGNOSIS — M5440 Lumbago with sciatica, unspecified side: Secondary | ICD-10-CM

## 2020-05-14 ENCOUNTER — Other Ambulatory Visit: Payer: Self-pay | Admitting: Cardiology

## 2020-05-14 ENCOUNTER — Other Ambulatory Visit: Payer: Self-pay | Admitting: Family Medicine

## 2020-05-14 DIAGNOSIS — M5440 Lumbago with sciatica, unspecified side: Secondary | ICD-10-CM

## 2020-05-29 ENCOUNTER — Other Ambulatory Visit: Payer: Self-pay | Admitting: Family Medicine

## 2020-06-13 ENCOUNTER — Ambulatory Visit: Payer: Medicare PPO | Admitting: Nurse Practitioner

## 2020-06-13 ENCOUNTER — Ambulatory Visit: Payer: Medicare PPO | Admitting: Family Medicine

## 2020-06-18 ENCOUNTER — Other Ambulatory Visit: Payer: Self-pay

## 2020-06-18 DIAGNOSIS — M5440 Lumbago with sciatica, unspecified side: Secondary | ICD-10-CM

## 2020-06-18 NOTE — Telephone Encounter (Signed)
Made pt aware he needs appointment soon. Pt VU. Michela Pitcher he does not feel the best and that is why he cancelled his appointment on the 15th and he will call to reschedule for week and half out.   Harrell Lark 06/18/20 8:47 AM

## 2020-06-20 ENCOUNTER — Other Ambulatory Visit: Payer: Self-pay

## 2020-06-20 DIAGNOSIS — M5440 Lumbago with sciatica, unspecified side: Secondary | ICD-10-CM

## 2020-06-20 MED ORDER — TIZANIDINE HCL 4 MG PO TABS: ORAL_TABLET | ORAL | 0 refills | Status: DC

## 2020-06-20 MED ORDER — PREDNISONE 50 MG PO TABS: 50.0000 mg | ORAL_TABLET | Freq: Every day | ORAL | 0 refills | Status: DC

## 2020-06-20 MED ORDER — LEVOTHYROXINE SODIUM 75 MCG PO TABS: 75.0000 ug | ORAL_TABLET | Freq: Every day | ORAL | 0 refills | Status: DC

## 2020-06-20 MED ORDER — AMLODIPINE BESYLATE 5 MG PO TABS: ORAL_TABLET | ORAL | 0 refills | Status: DC

## 2020-06-20 NOTE — Telephone Encounter (Signed)
Pt made appointment. Pt did say he is having back pain and that is why he is requesting the prednisone and tizanidine.   Royce Macadamia, [place] 06/20/20 11:56 AM

## 2020-06-26 ENCOUNTER — Ambulatory Visit: Payer: Medicare PPO | Admitting: Family Medicine

## 2020-07-03 ENCOUNTER — Ambulatory Visit: Payer: Medicare PPO | Admitting: Family Medicine

## 2020-07-03 ENCOUNTER — Other Ambulatory Visit: Payer: Self-pay

## 2020-07-03 VITALS — BP 128/60 | HR 64 | Temp 97.2°F | Resp 18 | Ht 70.0 in | Wt 207.0 lb

## 2020-07-03 DIAGNOSIS — E782 Mixed hyperlipidemia: Secondary | ICD-10-CM

## 2020-07-03 DIAGNOSIS — I5030 Unspecified diastolic (congestive) heart failure: Secondary | ICD-10-CM

## 2020-07-03 DIAGNOSIS — E291 Testicular hypofunction: Secondary | ICD-10-CM

## 2020-07-03 DIAGNOSIS — Z6829 Body mass index (BMI) 29.0-29.9, adult: Secondary | ICD-10-CM | POA: Diagnosis not present

## 2020-07-03 DIAGNOSIS — I10 Essential (primary) hypertension: Secondary | ICD-10-CM

## 2020-07-03 DIAGNOSIS — M25562 Pain in left knee: Secondary | ICD-10-CM

## 2020-07-03 DIAGNOSIS — M25561 Pain in right knee: Secondary | ICD-10-CM

## 2020-07-03 DIAGNOSIS — G8929 Other chronic pain: Secondary | ICD-10-CM | POA: Diagnosis not present

## 2020-07-03 DIAGNOSIS — E034 Atrophy of thyroid (acquired): Secondary | ICD-10-CM | POA: Diagnosis not present

## 2020-07-03 DIAGNOSIS — I110 Hypertensive heart disease with heart failure: Secondary | ICD-10-CM | POA: Diagnosis not present

## 2020-07-03 MED ORDER — DICLOFENAC SODIUM 1 % EX GEL: 4.0000 g | Freq: Four times a day (QID) | CUTANEOUS | 1 refills | Status: DC

## 2020-07-03 NOTE — Progress Notes (Signed)
Subjective:  Patient ID: Mark Mejia, male    DOB: [birth year]  Age: 65 y.o. MRN: [id number]  Chief Complaint  Patient presents with  . Hyperlipidemia  . Edema  . Gastroesophageal Reflux    HPI  Hypothyroidism: levothyroxine 75 mcg once daily in am.  Diagnosis 08/14/2014.  He denies any related symptoms.    GERD:  On omeprazole 40 mg once daily. History of esophageal cancer/throat cancer.  Has phlegm come up at night. Pt has difficulty with swallowing about every 6 months has to have his esophagus dilated. His oncologist is Dr. Bobby Rumpf.  His plastic surgeon is Dr. Harlow Mares.    Hypertensive atherosclerosis/carotid artery stenosis. Sees vascular surgeon Dr. Donzetta Matters every 6 months. Patient is on Aspirin 81 mg once daily, Norvasc 5 mg once daily, and Crestor 20 mg once daily at night. .  No CAD (LHC in 2019 normal.) NO AMI.  Patient tries to eat healthy. Exercising (walk 3 times a week for 5 minutes.) Prior echocardiogram showed a normal ejection fraction, but mild diastolic dysfunction.  Narrowing of carotid areas felt to be secondary to scarring from radiation. Sees Dr. Donzetta Matters annually.   He has hypogonadism with low testosterone level.  He has not been taking them for a while.   Patient has a history of cervical esophageal cancer in 2017 treated with chemoradiation.   Current Outpatient Medications on File Prior to Visit  Medication Sig Dispense Refill  . amLODipine (NORVASC) 5 MG tablet TAKE 1 TABLET BY MOUTH ONCE (1) DAILY 30 tablet 0  . aspirin EC 81 MG tablet Take 81 mg by mouth daily.     . furosemide (LASIX) 20 MG tablet TAKE 1 TABLET BY MOUTH TWICE(2) DAILY 180 tablet 0  . Glycerin-Hypromellose-PEG 400 ([facility] DRY EYE RELIEF OP) Place 2 drops into both eyes daily as needed (for dry eyes).    Marland Kitchen guaiFENesin-codeine 100-10 MG/5ML syrup Take 5 mLs by mouth 3 (three) times daily as needed for cough. 120 mL 0  . HYDROcodone-acetaminophen (NORCO/VICODIN) 5-325 MG tablet Take 1 tablet by mouth every  6 (six) hours as needed for moderate pain. 30 tablet 0  . levothyroxine (SYNTHROID) 75 MCG tablet Take 1 tablet (75 mcg total) by mouth daily. 30 tablet 0  . nitroGLYCERIN (NITROSTAT) 0.4 MG SL tablet Place 0.4 mg under the tongue every 5 (five) minutes as needed for chest pain.     Marland Kitchen omeprazole (PRILOSEC) 40 MG capsule TAKE 1 CAPSULE BY MOUTH DAILY 90 capsule 1  . rosuvastatin (CRESTOR) 20 MG tablet TAKE ONE TABLET BY MOUTH DAILY 90 tablet 1  . sildenafil (VIAGRA) 100 MG tablet Take 1 tablet (100 mg total) by mouth daily as needed for erectile dysfunction. Take 1/2 to 1 tablet 30-60 minutes prior to intercourse. 10 tablet 5  . testosterone cypionate (DEPOTESTOSTERONE CYPIONATE) 200 MG/ML injection Inject 1 mL (200 mg total) into the muscle every 14 (fourteen) days. 6 mL 1  . tiZANidine (ZANAFLEX) 4 MG tablet TAKE ONE TABLET BY MOUTH EVERY 6 HOURS AS NEEDED FOR MUSCLE SPASMS 30 tablet 0  . Vitamin D, Ergocalciferol, (DRISDOL) 1.25 MG (50000 UNIT) CAPS capsule TAKE 1 CAPSULE (50,000 UNITS TOTAL) BY MOUTH EVERY SUNDAY. 12 capsule 1   Current Facility-Administered Medications on File Prior to Visit  Medication Dose Route Frequency Provider Last Rate Last Admin  . testosterone cypionate (DEPOTESTOSTERONE CYPIONATE) injection 200 mg  200 mg Intramuscular Q14 Days Rochel Brome, MD   200 mg at 12/27/19 1530   Past  Medical History:  Diagnosis Date  . Angina pectoris ([place]) 05/25/2017  . Atrophy of thyroid   . Benign prostatic hyperplasia with nocturia 06/14/2019  . CAD (coronary artery disease)   . 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 Chest pain of uncertain etiology 9/73/5329  . Encounter for adjustment and management of vascular access device 03/10/2016  . Encounter for annual wellness exam in Medicare patient 03/10/2016  . GERD (gastroesophageal reflux disease)   . Hepatitis C   . History of esophageal cancer   . 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 Hypertensive heart and chronic  kidney disease with high output heart failure ([place]) 06/14/2019  . Hypertrophic scar of skin 09/29/2016  . 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  . Mixed hyperlipidemia   . Peripheral vascular disease, asymptomatic ([place]) 05/23/2017   80-99% stenosis in the left internal coronary artery  . Precordial chest pain 12/26/2015  . Shortness of breath 12/26/2015  . Testicular hypofunction   . Vitamin D deficiency    Past Surgical History:  Procedure Laterality Date  . GASTROSTOMY W/ FEEDING TUBE    . LEFT HEART CATH AND CORONARY ANGIOGRAPHY N/A 05/25/2017   Procedure: LEFT HEART CATH AND CORONARY ANGIOGRAPHY;  Surgeon: Martinique, Peter M, MD;  Location: [facility];  Service: Cardiovascular;  Laterality: N/A;  . PORTACATH PLACEMENT    . THROAT SURGERY      Family History  Problem Relation Age of Onset  . Arthritis Mother   . Hypertension Mother   . Cancer - Other Maternal Grandmother    Social History   Socioeconomic History  . Marital status: Married    Spouse name: Not on file  . Number of children: Not on file  . Years of education: Not on file  . Highest education level: Not on file  Occupational History  . Occupation: Retired  Tobacco Use  . Smoking status: Never Smoker  . Smokeless tobacco: Never Used  Vaping Use  . Vaping Use: Never used  Substance and Sexual Activity  . Alcohol use: No  . Drug use: No  . Sexual activity: Not on file  Other Topics Concern  . Not on file  Social History Narrative  . Not on file   Social Determinants of Health   Financial Resource Strain: Not on file  Food Insecurity: Not on file  Transportation Needs: Not on file  Physical Activity: Not on file  Stress: Not on file  Social Connections: Not on file    Review of Systems  Constitutional: Negative for chills, fatigue and fever.  HENT: Negative for congestion, ear pain and sore throat.   Respiratory: Negative for cough and shortness of breath.   Cardiovascular: Negative for chest pain.   Gastrointestinal: Negative for abdominal pain, constipation, diarrhea, nausea and vomiting.  Endocrine: Negative for polydipsia, polyphagia and polyuria.  Genitourinary: Negative for dysuria and frequency.  Musculoskeletal: Positive for arthralgias, back pain and myalgias.       Back and BL knees hurt. Legs hurt. Tizanidine helps and he takes it at night.  When I gave him prednisone and he got better, so did not get xrays in 01/2021. Meloxicam does not seem to be helping. Would like something else to try.   Neurological: Negative for dizziness and headaches.  Psychiatric/Behavioral: Negative for dysphoric mood.       No dysphoria     Objective:  BP 128/60   Pulse 64   Temp (!) 97.2 F (36.2 C)   Resp 18  Ht 5\' 10"  (1.778 m)   Wt 207 lb (93.9 kg)   BMI 29.70 kg/m   BP/Weight 07/03/2020 02/19/2020 [id number]  Systolic BP 0000000 [id number] [id number]  Diastolic BP 60 68 76  Wt. (Lbs) 207 210 213  BMI 29.7 30.13 30.56    Physical Exam Vitals reviewed.  Constitutional:      Appearance: Normal appearance.  Neck:     Vascular: No carotid bruit.  Cardiovascular:     Rate and Rhythm: Normal rate and regular rhythm.     Pulses: Normal pulses.     Heart sounds: Normal heart sounds.  Pulmonary:     Effort: Pulmonary effort is normal.     Breath sounds: Normal breath sounds. No wheezing, rhonchi or rales.  Abdominal:     General: Bowel sounds are normal.     Palpations: Abdomen is soft.     Tenderness: There is no abdominal tenderness.  Neurological:     Mental Status: He is alert.  Psychiatric:        Mood and Affect: Mood normal.        Behavior: Behavior normal.     Diabetic Foot Exam - Simple   No data filed      Lab Results  Component Value Date   WBC 4.2 12/14/2019   HGB 12.9 (L) 12/14/2019   HCT 39.8 12/14/2019   PLT 139 (L) 12/14/2019   GLUCOSE 88 12/14/2019   CHOL 105 12/14/2019   TRIG 68 12/14/2019   HDL 28 (L) 12/14/2019   LDLCALC 62 12/14/2019   ALT 23 12/14/2019    AST 32 12/14/2019   NA 139 12/14/2019   K 4.0 12/14/2019   CL 102 12/14/2019   CREATININE 1.19 12/14/2019   BUN 14 12/14/2019   CO2 23 12/14/2019   TSH 3.130 12/14/2019   INR 1.1 05/23/2017      Assessment & Plan:   1. Mixed hyperlipidemia Well controlled.  No changes to medicines.  Continue to work on eating a healthy diet and exercise.  Labs drawn today.  - Lipid panel  2. Male hypogonadism - Testosterone,Free and Total  3. 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 - CBC with Differential/Platelet - Comprehensive metabolic panel  4. Atrophy of thyroid - TSH  5. Chronic pain of left knee Stop meloxicam  Start voltaren gel 4 gm four times a day as needed on knees.  May use on shoulders, elbows, hands at 2 gm four times a day as needed.  Please get knee xrays.  May call back if wishes to have knee injections. - DG Knee Complete 4 Views Left  6. Chronic pain of right knee Stop meloxicam  Start voltaren gel 4 gm four times a day as needed on knees.  May use on shoulders, elbows, hands at 2 gm four times a day as needed.  Please get knee xrays.  May call back if wishes to have knee injections. - DG Knee Complete 4 Views Right  7. BMI 29 Recommend continue to work on eating healthy diet and exercise.   Meds ordered this encounter  Medications  . diclofenac Sodium (VOLTAREN) 1 % GEL    Sig: Apply 4 g topically 4 (four) times daily.    Dispense:  1000 g    Refill:  1    Orders Placed This Encounter  Procedures  . DG Knee Complete 4 Views Left  . DG Knee Complete 4  Views Right  . CBC with Differential/Platelet  . Comprehensive metabolic panel  . Lipid panel  . TSH  . Testosterone,Free and Total     Follow-up: Return in about 6 months (around 01/03/2021) for fasting.  An After Visit Summary was printed and given to the patient.  Rochel Brome,  MD [facility] [phone]

## 2020-07-03 NOTE — Patient Instructions (Signed)
Stop meloxicam  Start voltaren gel 4 gm four times a day as needed on knees.  May use on shoulders, elbows, hands at 2 gm four times a day as needed.  Please get knee xrays.  May call back if wishes to have knee injections.  Osteoarthritis  Osteoarthritis is a type of arthritis. It refers to joint pain or joint disease. Osteoarthritis affects tissue that covers the ends of bones in joints (cartilage). Cartilage acts as a cushion between the bones and helps them move smoothly. Osteoarthritis occurs when cartilage in the joints gets worn down. Osteoarthritis is sometimes called "wear and tear" arthritis. Osteoarthritis is the most common form of arthritis. It often occurs in older people. It is a condition that gets worse over time. The joints most often affected by this condition are in the fingers, toes, hips, knees, and spine, including the neck and lower back. What are the causes? This condition is caused by the wearing down of cartilage that covers the ends of bones. What increases the risk? The following factors may make you more likely to develop this condition:  Being age 24 or older.  Obesity.  Overuse of joints.  Past injury of a joint.  Past surgery on a joint.  Family history of osteoarthritis. What are the signs or symptoms? The main symptoms of this condition are pain, swelling, and stiffness in the joint. Other symptoms may include:  An enlarged joint.  More pain and further damage caused by small pieces of bone or cartilage that break off and float inside of the joint.  Small deposits of bone (osteophytes) that grow on the edges of the joint.  A grating or scraping feeling inside the joint when you move it.  Popping or creaking sounds when you move.  Difficulty walking or exercising.  An inability to grip items, twist your hand(s), or control the movements of your hands and fingers. How is this diagnosed? This condition may be diagnosed based on:  Your  medical history.  A physical exam.  Your symptoms.  X-rays of the affected joint(s).  Blood tests to rule out other types of arthritis. How is this treated? There is no cure for this condition, but treatment can help control pain and improve joint function. Treatment may include a combination of therapies, such as:  Pain relief techniques, such as: ? Applying heat and cold to the joint. ? Massage. ? A form of talk therapy called cognitive behavioral therapy (CBT). This therapy helps you set goals and follow up on the changes that you make.  Medicines for pain and inflammation. The medicines can be taken by mouth or applied to the skin. They include: ? NSAIDs, such as ibuprofen. ? Prescription medicines. ? Strong anti-inflammatory medicines (corticosteroids). ? Certain nutritional supplements.  A prescribed exercise program. You may work with a physical therapist.  Assistive devices, such as a brace, wrap, splint, specialized glove, or cane.  A weight control plan.  Surgery, such as: ? An osteotomy. This is done to reposition the bones and relieve pain or to remove loose pieces of bone and cartilage. ? Joint replacement surgery. You may need this surgery if you have advanced osteoarthritis. Follow these instructions at home: Activity  Rest your affected joints as told by your health care provider.  Exercise as told by your health care provider. He or she may recommend specific types of exercise, such as: ? Strengthening exercises. These are done to strengthen the muscles that support joints affected by arthritis. ?  Aerobic activities. These are exercises, such as brisk walking or water aerobics, that increase your heart rate. ? Range-of-motion activities. These help your joints move more easily. ? Balance and agility exercises. Managing pain, stiffness, and swelling  If directed, apply heat to the affected area as often as told by your health care provider. Use the heat  source that your health care provider recommends, such as a moist heat pack or a heating pad. ? If you have a removable assistive device, remove it as told by your health care provider. ? Place a towel between your skin and the heat source. If your health care provider tells you to keep the assistive device on while you apply heat, place a towel between the assistive device and the heat source. ? Leave the heat on for 20-30 minutes. ? Remove the heat if your skin turns bright red. This is especially important if you are unable to feel pain, heat, or cold. You may have a greater risk of getting burned.  If directed, put ice on the affected area. To do this: ? If you have a removable assistive device, remove it as told by your health care provider. ? Put ice in a plastic bag. ? Place a towel between your skin and the bag. If your health care provider tells you to keep the assistive device on during icing, place a towel between the assistive device and the bag. ? Leave the ice on for 20 minutes, 2-3 times a day. ? Move your fingers or toes often to reduce stiffness and swelling. ? Raise (elevate) the injured area above the level of your heart while you are sitting or lying down.      General instructions  Take over-the-counter and prescription medicines only as told by your health care provider.  Maintain a healthy weight. Follow instructions from your health care provider for weight control.  Do not use any products that contain nicotine or tobacco, such as cigarettes, e-cigarettes, and chewing tobacco. If you need help quitting, ask your health care provider.  Use assistive devices as told by your health care provider.  Keep all follow-up visits as told by your health care provider. This is important. Where to find more information  [employer] of Arthritis and Musculoskeletal and Skin Diseases: www.niams.SouthExposed.es  [employer] on Aging: http://kim-miller.com/  American College of  Rheumatology: www.rheumatology.org Contact a health care provider if:  You have redness, swelling, or a feeling of warmth in a joint that gets worse.  You have a fever along with joint or muscle aches.  You develop a rash.  You have trouble doing your normal activities. Get help right away if:  You have pain that gets worse and is not relieved by pain medicine. Summary  Osteoarthritis is a type of arthritis that affects tissue covering the ends of bones in joints (cartilage).  This condition is caused by the wearing down of cartilage that covers the ends of bones.  The main symptom of this condition is pain, swelling, and stiffness in the joint.  There is no cure for this condition, but treatment can help control pain and improve joint function. This information is not intended to replace advice given to you by your health care provider. Make sure you discuss any questions you have with your health care provider. Document Revised: 02/12/2019 Document Reviewed: 02/12/2019 Elsevier Patient Education  2021 [employer].

## 2020-07-06 ENCOUNTER — Encounter: Payer: Self-pay | Admitting: Family Medicine

## 2020-07-07 LAB — CBC WITH DIFFERENTIAL/PLATELET
Basophils Absolute: 0.1 10*3/uL (ref 0.0–0.2)
Basos: 1 %
EOS (ABSOLUTE): 0.2 10*3/uL (ref 0.0–0.4)
Eos: 3 %
Hematocrit: 42 % (ref 37.5–51.0)
Hemoglobin: 13.9 g/dL (ref 13.0–17.7)
Immature Grans (Abs): 0 10*3/uL (ref 0.0–0.1)
Immature Granulocytes: 0 %
Lymphocytes Absolute: 1.9 10*3/uL (ref 0.7–3.1)
Lymphs: 32 %
MCH: 27.7 pg (ref 26.6–33.0)
MCHC: 33.1 g/dL (ref 31.5–35.7)
MCV: 84 fL (ref 79–97)
Monocytes Absolute: 0.3 10*3/uL (ref 0.1–0.9)
Monocytes: 5 %
Neutrophils Absolute: 3.4 10*3/uL (ref 1.4–7.0)
Neutrophils: 59 %
Platelets: 145 10*3/uL — ABNORMAL LOW (ref 150–450)
RBC: 5.01 x10E6/uL (ref 4.14–5.80)
RDW: 14.1 % (ref 11.6–15.4)
WBC: 5.8 10*3/uL (ref 3.4–10.8)

## 2020-07-07 LAB — COMPREHENSIVE METABOLIC PANEL
ALT: 31 IU/L (ref 0–44)
AST: 32 IU/L (ref 0–40)
Albumin/Globulin Ratio: 1.8 (ref 1.2–2.2)
Albumin: 4.8 g/dL (ref 3.8–4.8)
Alkaline Phosphatase: 68 IU/L (ref 44–121)
BUN/Creatinine Ratio: 13 (ref 10–24)
BUN: 14 mg/dL (ref 8–27)
Bilirubin Total: 0.5 mg/dL (ref 0.0–1.2)
CO2: 21 mmol/L (ref 20–29)
Calcium: 9.9 mg/dL (ref 8.6–10.2)
Chloride: 105 mmol/L (ref 96–106)
Creatinine, Ser: 1.04 mg/dL (ref 0.76–1.27)
Globulin, Total: 2.7 g/dL (ref 1.5–4.5)
Glucose: 83 mg/dL (ref 65–99)
Potassium: 4.1 mmol/L (ref 3.5–5.2)
Sodium: 141 mmol/L (ref 134–144)
Total Protein: 7.5 g/dL (ref 6.0–8.5)
eGFR: 79 mL/min/{1.73_m2} (ref >59)

## 2020-07-07 LAB — LIPID PANEL
Chol/HDL Ratio: 4 ratio (ref 0.0–5.0)
Cholesterol, Total: 121 mg/dL (ref 100–199)
HDL: 30 mg/dL — ABNORMAL LOW (ref >39)
LDL Chol Calc (NIH): 65 mg/dL (ref 0–99)
Triglycerides: 146 mg/dL (ref 0–149)
VLDL Cholesterol Cal: 26 mg/dL (ref 5–40)

## 2020-07-07 LAB — TESTOSTERONE,FREE AND TOTAL
Testosterone, Free: 5.2 pg/mL — ABNORMAL LOW (ref 6.6–18.1)
Testosterone: 256 ng/dL — ABNORMAL LOW (ref 264–916)

## 2020-07-07 LAB — CARDIOVASCULAR RISK ASSESSMENT

## 2020-07-07 LAB — TSH: TSH: 3.49 u[IU]/mL (ref 0.450–4.500)

## 2020-07-10 ENCOUNTER — Other Ambulatory Visit: Payer: Self-pay

## 2020-07-13 ENCOUNTER — Other Ambulatory Visit: Payer: Self-pay | Admitting: Family Medicine

## 2020-07-13 MED ORDER — TESTOSTERONE CYPIONATE 200 MG/ML IM SOLN: 200.0000 mg | INTRAMUSCULAR | 1 refills | Status: DC

## 2020-07-16 ENCOUNTER — Other Ambulatory Visit: Payer: Self-pay | Admitting: Family Medicine

## 2020-07-16 DIAGNOSIS — M5440 Lumbago with sciatica, unspecified side: Secondary | ICD-10-CM

## 2020-07-17 ENCOUNTER — Encounter: Payer: Self-pay | Admitting: Cardiology

## 2020-07-17 ENCOUNTER — Other Ambulatory Visit: Payer: Self-pay

## 2020-07-17 ENCOUNTER — Ambulatory Visit: Payer: Medicare PPO | Admitting: Cardiology

## 2020-07-17 VITALS — BP 120/80 | HR 60 | Ht 70.0 in | Wt 209.0 lb

## 2020-07-17 DIAGNOSIS — I209 Angina pectoris, unspecified: Secondary | ICD-10-CM

## 2020-07-17 DIAGNOSIS — Z8501 Personal history of malignant neoplasm of esophagus: Secondary | ICD-10-CM

## 2020-07-17 DIAGNOSIS — E782 Mixed hyperlipidemia: Secondary | ICD-10-CM | POA: Diagnosis not present

## 2020-07-17 DIAGNOSIS — R0602 Shortness of breath: Secondary | ICD-10-CM | POA: Diagnosis not present

## 2020-07-17 NOTE — Progress Notes (Signed)
Cardiology Office Note:    Date:  07/17/2020   ID:  Mark Mejia, DOB Jan 08, 1956, MRN [id number]  PCP:  Rochel Brome, MD  Cardiologist:  Jenne Campus, MD    Referring MD: Rochel Brome, MD   Chief Complaint  Patient presents with  . Follow-up  Am doing well  History of Present Illness:    Mark Mejia is a 65 y.o. 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 He comes today to my office for follow-up.  Overall doing well.  Denies have any chest pain tightness squeezing pressure burning chest.  He said he is able to walk around and do quite well doing it overall he is happy the way that he feels.  Denies have any dizziness passing out no swelling of lower extremities.  Past Medical History:  Diagnosis Date  . Angina pectoris ([place]) 05/25/2017  . Atrophy of thyroid   . Benign prostatic hyperplasia with nocturia 06/14/2019  . CAD (coronary artery disease)   . Cancer of cervical esophagus ([place]) 12/06/2012  . Chest pain of uncertain etiology 4/40/3474  . Encounter for adjustment and management of vascular access device 03/10/2016  . Encounter for annual wellness exam in Medicare patient 03/10/2016  . GERD (gastroesophageal reflux disease)   . Hepatitis C   . History of esophageal cancer   . 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 Hypertrophic scar of skin 09/29/2016  . 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  . Mixed hyperlipidemia   . Peripheral vascular disease, asymptomatic ([place]) 05/23/2017   80-99% stenosis in the left internal coronary artery  . Precordial chest pain 12/26/2015  . Shortness of breath  12/26/2015  . Testicular hypofunction   . Vitamin D deficiency     Past Surgical History:  Procedure Laterality Date  . GASTROSTOMY W/ FEEDING TUBE    . LEFT HEART CATH AND CORONARY ANGIOGRAPHY N/A 05/25/2017   Procedure: LEFT HEART CATH AND CORONARY ANGIOGRAPHY;  Surgeon: Martinique, Peter M, MD;  Location: [facility];  Service: Cardiovascular;  Laterality: N/A;  . PORTACATH PLACEMENT    . THROAT SURGERY      Current Medications: Current Meds  Medication Sig  . amLODipine (NORVASC) 5 MG tablet TAKE 1 TABLET BY MOUTH DAILY (Patient taking differently: Take 5 mg by mouth daily.)  . aspirin EC 81 MG tablet Take 81 mg by mouth daily.   . furosemide (LASIX) 20 MG tablet TAKE 1 TABLET BY MOUTH TWICE(2) DAILY (Patient taking differently: Take 20 mg by mouth daily.)  . Glycerin-Hypromellose-PEG 400 ([facility] DRY EYE RELIEF OP) Place 2 drops into both eyes daily as needed (for dry eyes).  Marland Kitchen guaiFENesin-codeine 100-10 MG/5ML syrup Take 5 mLs by mouth 3 (three) times daily as needed for cough. (Patient taking differently: Take 5 mLs by mouth 2 (two) times daily as needed for cough.)  . HYDROcodone-acetaminophen (NORCO/VICODIN) 5-325 MG tablet Take 1 tablet by mouth every 6 (six) hours as needed for moderate pain. (Patient taking differently: Take 1 tablet by mouth as needed for moderate pain.)  . levothyroxine (SYNTHROID) 75 MCG tablet TAKE 1 TABLET BY MOUTH DAILY (Patient taking differently: Take 75 mcg by mouth daily before breakfast.)  .  meloxicam (MOBIC) 7.5 MG tablet Take 7.5 mg by mouth 2 (two) times daily as needed for pain.  . nitroGLYCERIN (NITROSTAT) 0.4 MG SL tablet Place 0.4 mg under the tongue every 5 (five) minutes as needed for chest pain.   Marland Kitchen omeprazole (PRILOSEC) 40 MG capsule TAKE 1 CAPSULE BY MOUTH DAILY (Patient taking differently: Take 40 mg by mouth daily.)  . PREDNISOLONE PO Take 50 mg by mouth as needed (Leg pain).  . rosuvastatin (CRESTOR) 20 MG tablet TAKE ONE TABLET BY  MOUTH DAILY (Patient taking differently: Take 20 mg by mouth daily.)  . sildenafil (VIAGRA) 100 MG tablet Take 1 tablet (100 mg total) by mouth daily as needed for erectile dysfunction. Take 1/2 to 1 tablet 30-60 minutes prior to intercourse.  . testosterone cypionate (DEPOTESTOSTERONE CYPIONATE) 200 MG/ML injection Inject 1 mL (200 mg total) into the muscle every 14 (fourteen) days.  Marland Kitchen tiZANidine (ZANAFLEX) 4 MG tablet TAKE ONE TABLET BY MOUTH EVERY SIX (6) HOURS AS NEEDED FOR MUSCLE SPASMS (Patient taking differently: Take 4 mg by mouth every 6 (six) hours as needed for muscle spasms.)  . Vitamin D, Ergocalciferol, (DRISDOL) 1.25 MG (50000 UNIT) CAPS capsule TAKE 1 CAPSULE (50,000 UNITS TOTAL) BY MOUTH EVERY SUNDAY.     Allergies:   Patient has no known allergies.   Social History   Socioeconomic History  . Marital status: Married    Spouse name: Not on file  . Number of children: Not on file  . Years of education: Not on file  . Highest education level: Not on file  Occupational History  . Occupation: Retired  Tobacco Use  . Smoking status: Never Smoker  . Smokeless tobacco: Never Used  Vaping Use  . Vaping Use: Never used  Substance and Sexual Activity  . Alcohol use: No  . Drug use: No  . Sexual activity: Not on file  Other Topics Concern  . Not on file  Social History Narrative  . Not on file   Social Determinants of Health   Financial Resource Strain: Not on file  Food Insecurity: Not on file  Transportation Needs: Not on file  Physical Activity: Not on file  Stress: Not on file  Social Connections: Not on file     Family History: The patient's family history includes Arthritis in his mother; Cancer - Other in his maternal grandmother; Hypertension in his mother. ROS:   Please see the history of present illness.    All 14 point review of systems negative except as described per history of present illness  EKGs/Labs/Other Studies Reviewed:      Recent  Labs: 07/03/2020: ALT 31; BUN 14; Creatinine, Ser 1.04; Hemoglobin 13.9; Platelets 145; Potassium 4.1; Sodium 141; TSH 3.490  Recent Lipid Panel    Component Value Date/Time   CHOL 121 07/03/2020 1516   TRIG 146 07/03/2020 1516   HDL 30 (L) 07/03/2020 1516   CHOLHDL 4.0 07/03/2020 1516   LDLCALC 65 07/03/2020 1516    Physical Exam:    VS:  BP 120/80 (BP Location: Left Arm, Patient Position: Sitting, Cuff Size: Normal)   Pulse 60   Ht 5\' 10"  (1.778 m)   Wt 209 lb (94.8 kg)   SpO2 96%   BMI 29.99 kg/m     Wt Readings from Last 3 Encounters:  07/17/20 209 lb (94.8 kg)  07/03/20 207 lb (93.9 kg)  02/19/20 210 lb (95.3 kg)     GEN:  Well nourished, well developed in no acute distress  HEENT: Normal NECK: No JVD; No carotid bruits LYMPHATICS: No lymphadenopathy CARDIAC: RRR, no murmurs, no rubs, no gallops RESPIRATORY:  Clear to auscultation without rales, wheezing or rhonchi  ABDOMEN: Soft, non-tender, non-distended MUSCULOSKELETAL:  No edema; No deformity  SKIN: Warm and dry LOWER EXTREMITIES: no swelling NEUROLOGIC:  Alert and oriented x 3 PSYCHIATRIC:  Normal affect   ASSESSMENT:    1. Angina pectoris ([place])   2. Shortness of breath   3. Mixed hyperlipidemia   4. History of esophageal cancer    PLAN:    In order of problems listed above:  1. Angina pectoris, denies having any, cardiac catheterization 2019 was normal.  The key is risk factors modifications he is already on antiplatelet therapy which I will continue as well as high intensity statin which I will continue. 2. Shortness of breath improved denies have any chest pain tightness squeezing pressure burning chest. 3. Dyslipidemia, I did review his K PN which was done in 07/03/2020 showing LDL of 65 HDL 38 excellent cholesterol profile, we will continue present management which include high intense statin in form of Crestor. 4. History of esophageal cancer.  Stable. 5. We did talk about healthy lifestyle need to  exercise and regular basis as well as being active he continues to do that   Medication Adjustments/Labs and Tests Ordered: Current medicines are reviewed at length with the patient today.  Concerns regarding medicines are outlined above.  No orders of the defined types were placed in this encounter.  Medication changes: No orders of the defined types were placed in this encounter.   Signed, Park Liter, MD, [facility] 07/17/2020 4:16 PM    [place]

## 2020-07-17 NOTE — Patient Instructions (Signed)
Medication Instructions:  No medication changes. *If you need a refill on your cardiac medications before your next appointment, please call your pharmacy*   Lab Work: None ordered If you have labs (blood work) drawn today and your tests are completely normal, you will receive your results only by: . MyChart Message (if you have MyChart) OR . A paper copy in the mail If you have any lab test that is abnormal or we need to change your treatment, we will call you to review the results.   Testing/Procedures: None ordered   Follow-Up: At [facility], you and your health needs are our priority.  As part of our continuing mission to provide you with exceptional heart care, we have created designated Provider Care Teams.  These Care Teams include your primary Cardiologist (physician) and Advanced Practice Providers (APPs -  Physician Assistants and Nurse Practitioners) who all work together to provide you with the care you need, when you need it.  We recommend signing up for the patient portal called "MyChart".  Sign up information is provided on this After Visit Summary.  MyChart is used to connect with patients for Virtual Visits (Telemedicine).  Patients are able to view lab/test results, encounter notes, upcoming appointments, etc.  Non-urgent messages can be sent to your provider as well.   To learn more about what you can do with MyChart, go to https://www.mychart.com.    Your next appointment:   6 month(s)  The format for your next appointment:   In Person  Provider:   Robert Krasowski, MD   Other Instructions NA  

## 2020-07-23 ENCOUNTER — Ambulatory Visit (HOSPITAL_COMMUNITY)
Admission: RE | Admit: 2020-07-23 | Discharge: 2020-07-23 | Disposition: A | Discharge status: Home / Self Care | Payer: Medicare PPO | Source: Ambulatory Visit | Attending: Vascular Surgery | Admitting: Vascular Surgery

## 2020-07-23 ENCOUNTER — Other Ambulatory Visit: Payer: Self-pay

## 2020-07-23 ENCOUNTER — Ambulatory Visit: Payer: Medicare PPO | Admitting: Physician Assistant

## 2020-07-23 VITALS — BP 145/81 | HR 59 | Temp 97.6°F | Resp 20 | Ht 70.0 in | Wt 206.9 lb

## 2020-07-23 DIAGNOSIS — I6523 Occlusion and stenosis of bilateral carotid arteries: Secondary | ICD-10-CM

## 2020-07-23 NOTE — Progress Notes (Signed)
Office Note     CC:  follow up Requesting Provider:  Rochel Brome, MD  HPI: Mark Mejia is a 65 y.o. ([birth year]) male who presents for routine follow up of carotid artery stenosis. He has a history is significant for cervical esophageal cancer and underwent chemoradiation.  He was found to have bilateral carotid artery stenosis. It was thought that some of the stenosis was due to radiation and scaring. No history of stroke or TIA.  He denies upper or lower extremity weakness, paralysis, slurred speech or monocular blindness.  The pt is  on a statin for cholesterol management.  The pt is on a daily aspirin.   Other AC: none The pt is on CCB and Diuretic for hypertension.   The pt is not diabetic.  Tobacco hx: Never  Past Medical History:  Diagnosis Date  . Angina pectoris ([place]) 05/25/2017  . Atrophy of thyroid   . Benign prostatic hyperplasia with nocturia 06/14/2019  . CAD (coronary artery disease)   . Cancer of cervical esophagus ([place]) 12/06/2012  . Chest pain of uncertain etiology 5/00/9381  . Encounter for adjustment and management of vascular access device 03/10/2016  . Encounter for annual wellness exam in Medicare patient 03/10/2016  . GERD (gastroesophageal reflux disease)   . Hepatitis C   . History of esophageal cancer   . 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 Hypertrophic scar of skin 09/29/2016  . 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  . Mixed hyperlipidemia   . Peripheral vascular disease, asymptomatic ([place]) 05/23/2017   80-99% stenosis in the left internal coronary artery  . Precordial chest pain 12/26/2015  . Shortness of breath 12/26/2015  . Testicular hypofunction   . Vitamin D deficiency     Past Surgical History:  Procedure Laterality Date  . GASTROSTOMY W/ FEEDING TUBE    . LEFT HEART CATH AND CORONARY ANGIOGRAPHY N/A 05/25/2017   Procedure: LEFT HEART CATH AND CORONARY  ANGIOGRAPHY;  Surgeon: Martinique, Peter M, MD;  Location: [place] [facility];  Service: Cardiovascular;  Laterality: N/A;  . PORTACATH PLACEMENT    . THROAT SURGERY      Social History   Socioeconomic History  . Marital status: Married    Spouse name: Not on file  . Number of children: Not on file  . Years of education: Not on file  . Highest education level: Not on file  Occupational History  . Occupation: Retired  Tobacco Use  . Smoking status: Never Smoker  . Smokeless tobacco: Never Used  Vaping Use  . Vaping Use: Never used  Substance and Sexual Activity  . Alcohol use: No  . Drug use: No  . Sexual activity: Not on file  Other Topics Concern  . Not on file  Social History Narrative  . Not on file   Social Determinants of Health   Financial Resource Strain: Not on file  Food Insecurity: Not on file  Transportation Needs: Not on file  Physical Activity: Not on file  Stress: Not on file  Social Connections: Not on file  Intimate Partner Violence: Not on file   Family History  Problem Relation Age of Onset  . Arthritis Mother   . Hypertension Mother   . Cancer - Other Maternal Grandmother     Current Outpatient Medications  Medication Sig Dispense Refill  . amLODipine (NORVASC) 5 MG tablet Take 5 mg by mouth daily.    Marland Kitchen  aspirin EC 81 MG tablet Take 81 mg by mouth daily.     . furosemide (LASIX) 20 MG tablet Take 20 mg by mouth daily.    . Glycerin-Hypromellose-PEG 400 ([facility] DRY EYE RELIEF OP) Place 2 drops into both eyes daily as needed (for dry eyes).    Marland Kitchen guaiFENesin-codeine (ROBITUSSIN AC) 100-10 MG/5ML syrup Take 5 mLs by mouth 2 (two) times daily as needed for cough.    Marland Kitchen HYDROcodone-acetaminophen (NORCO/VICODIN) 5-325 MG tablet Take 1 tablet by mouth every 6 (six) hours as needed for moderate pain. 30 tablet 0  . levothyroxine (SYNTHROID) 75 MCG tablet Take 75 mcg by mouth daily before breakfast.    . meloxicam (MOBIC) 7.5 MG tablet Take 7.5 mg by mouth 2  (two) times daily as needed for pain.    . nitroGLYCERIN (NITROSTAT) 0.4 MG SL tablet Place 0.4 mg under the tongue every 5 (five) minutes as needed for chest pain.     Marland Kitchen omeprazole (PRILOSEC) 40 MG capsule Take 40 mg by mouth daily.    Marland Kitchen PREDNISOLONE PO Take 50 mg by mouth as needed (Leg pain).    . rosuvastatin (CRESTOR) 20 MG tablet Take 20 mg by mouth daily.    . sildenafil (VIAGRA) 100 MG tablet Take 1 tablet (100 mg total) by mouth daily as needed for erectile dysfunction. Take 1/2 to 1 tablet 30-60 minutes prior to intercourse. 10 tablet 5  . testosterone cypionate (DEPOTESTOSTERONE CYPIONATE) 200 MG/ML injection Inject 1 mL (200 mg total) into the muscle every 14 (fourteen) days. 6 mL 1  . tiZANidine (ZANAFLEX) 4 MG tablet Take 4 mg by mouth every 6 (six) hours as needed for muscle spasms.    . Vitamin D, Ergocalciferol, (DRISDOL) 1.25 MG (50000 UNIT) CAPS capsule TAKE 1 CAPSULE (50,000 UNITS TOTAL) BY MOUTH EVERY SUNDAY. 12 capsule 1   No current facility-administered medications for this visit.    No Known Allergies   REVIEW OF SYSTEMS:  [X]  denotes positive finding, [ ]  denotes negative finding Cardiac  Comments:  Chest pain or chest pressure:    Shortness of breath upon exertion:    Short of breath when lying flat:    Irregular heart rhythm:        Vascular    Pain in calf, thigh, or hip brought on by ambulation:    Pain in feet at night that wakes you up from your sleep:     Blood clot in your veins:    Leg swelling:         Pulmonary    Oxygen at home:    Productive cough:     Wheezing:         Neurologic    Sudden weakness in arms or legs:     Sudden numbness in arms or legs:     Sudden onset of difficulty speaking or slurred speech:    Temporary loss of vision in one eye:     Problems with dizziness:         Gastrointestinal    Blood in stool:     Vomited blood:         Genitourinary    Burning when urinating:     Blood in urine:        Psychiatric     Major depression:         Hematologic    Bleeding problems:    Problems with blood clotting too easily:        Skin  Rashes or ulcers:        Constitutional    Fever or chills:      PHYSICAL EXAMINATION:  Vitals:   07/23/20 1456 07/23/20 1458  BP: (!) 143/79 (!) 145/81  Pulse: (!) 59   Resp: 20   Temp: 97.6 F (36.4 C)   TempSrc: Temporal   SpO2: 100%   Weight: 206 lb 14.4 oz (93.8 kg)   Height: 5\' 10"  (1.778 m)     General:  WDWN in NAD; vital signs documented above Gait: Normal HENT: WNL, normocephalic Pulmonary: normal non-labored breathing , without wheezing Cardiac: regular HR, without  Murmurs without carotid bruit Abdomen: soft, NT, no masses Vascular Exam/Pulses:  Right Left  Radial 2+ (normal) 2+ (normal)  Femoral 2+ (normal) 2+ (normal)  Popliteal 2+ (normal) 2+ (normal)  DP 2+ (normal) 2+ (normal)  PT 2+ (normal) 2+ (normal)   Musculoskeletal: no muscle wasting or atrophy  Neurologic: A&O X 3;  No focal weakness or paresthesias are detected Psychiatric:  The pt has Normal affect.   Non-Invasive Vascular Imaging:  07/23/20 Summary:  Right Carotid: Velocities in the right ICA are consistent with a 40-59% stenosis.   Left Carotid: Velocities in the left ICA are consistent with a 40-59% stenosis.   Vertebrals: Bilateral vertebral arteries demonstrate antegrade flow.  Subclavians: Normal flow hemodynamics were seen in bilateral subclavian arteries.   ASSESSMENT/PLAN:: 65 y.o. male here for follow up with bilateral extracranial carotid stenosis. He has no history of Stroke or TIA. He remains without any attributable symptoms. His duplex shows bilateral 40-59% stenosis.  -We reviewed signs and symptoms of TIA and Stroke and he understands to seek immediate medical attention should this occur - continue Aspirin and Statin  - he will follow up in 6 months with carotid duplex   Karoline Caldwell, PA-C [facility] [phone]  Clinic MD:  Scot Dock

## 2020-07-25 ENCOUNTER — Other Ambulatory Visit: Payer: Self-pay

## 2020-07-25 DIAGNOSIS — I6523 Occlusion and stenosis of bilateral carotid arteries: Secondary | ICD-10-CM

## 2020-07-28 ENCOUNTER — Other Ambulatory Visit: Payer: Self-pay | Admitting: Nurse Practitioner

## 2020-08-07 DIAGNOSIS — H401113 Primary open-angle glaucoma, right eye, severe stage: Secondary | ICD-10-CM | POA: Diagnosis not present

## 2020-08-12 ENCOUNTER — Ambulatory Visit: Payer: Medicare PPO

## 2020-08-21 ENCOUNTER — Other Ambulatory Visit: Payer: Self-pay

## 2020-08-21 ENCOUNTER — Ambulatory Visit (INDEPENDENT_AMBULATORY_CARE_PROVIDER_SITE_OTHER): Payer: Medicare PPO

## 2020-08-21 DIAGNOSIS — E291 Testicular hypofunction: Secondary | ICD-10-CM | POA: Diagnosis not present

## 2020-08-21 MED ORDER — TESTOSTERONE CYPIONATE 200 MG/ML IM SOLN
200.0000 mg | INTRAMUSCULAR | Status: DC
Start: 2020-08-21 — End: 2021-01-02
  Administered 2020-08-21 – 2020-12-11 (×5): 200 mg via INTRAMUSCULAR

## 2020-08-21 NOTE — Progress Notes (Signed)
   Testosterone injection given per order, patient tolerated well.   Erie Noe, LPN 0:34 PM

## 2020-09-04 ENCOUNTER — Ambulatory Visit (INDEPENDENT_AMBULATORY_CARE_PROVIDER_SITE_OTHER): Payer: Medicare PPO

## 2020-09-04 ENCOUNTER — Other Ambulatory Visit: Payer: Self-pay

## 2020-09-04 DIAGNOSIS — E291 Testicular hypofunction: Secondary | ICD-10-CM | POA: Diagnosis not present

## 2020-09-04 NOTE — Progress Notes (Signed)
   Testosterone injection given per order, patient tolerated well.   Erie Noe, LPN 7:12 PM

## 2020-09-18 ENCOUNTER — Ambulatory Visit (INDEPENDENT_AMBULATORY_CARE_PROVIDER_SITE_OTHER): Payer: Medicare PPO

## 2020-09-18 DIAGNOSIS — E291 Testicular hypofunction: Secondary | ICD-10-CM

## 2020-09-18 DIAGNOSIS — H401113 Primary open-angle glaucoma, right eye, severe stage: Secondary | ICD-10-CM | POA: Diagnosis not present

## 2020-09-18 NOTE — Progress Notes (Signed)
   Testosterone injection given per order, patient tolerated well.   Erie Noe, LPN 7:89 PM

## 2020-10-02 ENCOUNTER — Ambulatory Visit (INDEPENDENT_AMBULATORY_CARE_PROVIDER_SITE_OTHER): Payer: Medicare PPO

## 2020-10-02 ENCOUNTER — Other Ambulatory Visit: Payer: Self-pay

## 2020-10-02 DIAGNOSIS — E291 Testicular hypofunction: Secondary | ICD-10-CM

## 2020-10-02 NOTE — Progress Notes (Signed)
   Testosterone injection given per order, patient tolerated well.   Erie Noe, LPN [id number] PM

## 2020-10-03 ENCOUNTER — Other Ambulatory Visit: Payer: Self-pay | Admitting: Family Medicine

## 2020-10-14 ENCOUNTER — Other Ambulatory Visit: Payer: Self-pay | Admitting: Family Medicine

## 2020-10-14 ENCOUNTER — Other Ambulatory Visit: Payer: Self-pay | Admitting: Physician Assistant

## 2020-11-13 ENCOUNTER — Other Ambulatory Visit: Payer: Self-pay | Admitting: Cardiology

## 2020-11-26 ENCOUNTER — Ambulatory Visit (INDEPENDENT_AMBULATORY_CARE_PROVIDER_SITE_OTHER): Payer: Medicare PPO | Admitting: Family Medicine

## 2020-11-26 ENCOUNTER — Encounter: Payer: Self-pay | Admitting: Family Medicine

## 2020-11-26 ENCOUNTER — Ambulatory Visit (INDEPENDENT_AMBULATORY_CARE_PROVIDER_SITE_OTHER): Payer: Medicare PPO

## 2020-11-26 ENCOUNTER — Other Ambulatory Visit: Payer: Self-pay

## 2020-11-26 VITALS — BP 136/84 | HR 61 | Temp 95.7°F | Ht 71.0 in | Wt 212.0 lb

## 2020-11-26 DIAGNOSIS — M545 Low back pain, unspecified: Secondary | ICD-10-CM | POA: Insufficient documentation

## 2020-11-26 DIAGNOSIS — R400 Somnolence: Secondary | ICD-10-CM | POA: Diagnosis not present

## 2020-11-26 DIAGNOSIS — I739 Peripheral vascular disease, unspecified: Secondary | ICD-10-CM | POA: Diagnosis not present

## 2020-11-26 DIAGNOSIS — R0683 Snoring: Secondary | ICD-10-CM | POA: Diagnosis not present

## 2020-11-26 DIAGNOSIS — R0681 Apnea, not elsewhere classified: Secondary | ICD-10-CM | POA: Insufficient documentation

## 2020-11-26 DIAGNOSIS — N401 Enlarged prostate with lower urinary tract symptoms: Secondary | ICD-10-CM

## 2020-11-26 DIAGNOSIS — R5383 Other fatigue: Secondary | ICD-10-CM | POA: Diagnosis not present

## 2020-11-26 DIAGNOSIS — E782 Mixed hyperlipidemia: Secondary | ICD-10-CM

## 2020-11-26 DIAGNOSIS — R0609 Other forms of dyspnea: Secondary | ICD-10-CM

## 2020-11-26 DIAGNOSIS — R351 Nocturia: Secondary | ICD-10-CM

## 2020-11-26 DIAGNOSIS — Z23 Encounter for immunization: Secondary | ICD-10-CM | POA: Diagnosis not present

## 2020-11-26 DIAGNOSIS — R0600 Dyspnea, unspecified: Secondary | ICD-10-CM | POA: Diagnosis not present

## 2020-11-26 DIAGNOSIS — E663 Overweight: Secondary | ICD-10-CM | POA: Insufficient documentation

## 2020-11-26 DIAGNOSIS — G8929 Other chronic pain: Secondary | ICD-10-CM | POA: Insufficient documentation

## 2020-11-26 DIAGNOSIS — I5030 Unspecified diastolic (congestive) heart failure: Secondary | ICD-10-CM

## 2020-11-26 DIAGNOSIS — I110 Hypertensive heart disease with heart failure: Secondary | ICD-10-CM

## 2020-11-26 MED ORDER — TAMSULOSIN HCL 0.4 MG PO CAPS: 0.4000 mg | ORAL_CAPSULE | Freq: Every day | ORAL | 3 refills | Status: DC

## 2020-11-26 NOTE — Patient Instructions (Addendum)
Start on flomax 0.4 mg once at night.  Continue meloxicam 15 mg once daily.  Use tylenol 1 every 4-6 hours as needed.  May use ice or heat on back.  Get back xray at [facility].  Order sleep study.

## 2020-11-26 NOTE — Assessment & Plan Note (Signed)
Check sleep study.  Check labs.

## 2020-11-26 NOTE — Assessment & Plan Note (Signed)
Check sleep study 

## 2020-11-26 NOTE — Assessment & Plan Note (Addendum)
Check sleep study.  Previous home sleep study done several years ago was inconclusive due to him getting up so many times. Hopefully Tamsulosin will help.

## 2020-11-26 NOTE — Progress Notes (Signed)
Acute Office Visit  Subjective:    Patient ID: Mark Mejia, male    DOB: [birth year], 65 y.o.   MRN: [id number]  Chief Complaint  Patient presents with   Back Pain    HPI Patient is in today for lower back pain, sharp radiating pain shooting down BL legs, constant pain, taking hydrocodone which helps a little, applying a heat pad and tried a hand massager which helps a little also. Taking hydrocodone once daily at night. Wife states that patient was walking in the yard and stepped in a hole which caused his current back pain to flare up more.  C/o feeling more fatigue. States Mark Mejia wakes up several times through out the night. Has never had a sleep study. Reports Mark Mejia gets SOB if walking over 50 ft. Has a f/u appt with Cardiology in 03/05/2021. Pt snores and has apneic episodes that his wife has noted. Wife sleeps in another room due to loud snoring.   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[place]) 06/14/2019   Hypertrophic scar of skin 09/29/2016   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   Mixed hyperlipidemia    Peripheral vascular disease, asymptomatic ([place]) 05/23/2017   80-99% stenosis in the left internal coronary artery   Precordial chest pain 12/26/2015   Shortness of breath 12/26/2015   Testicular hypofunction    Vitamin D deficiency     Past Surgical History:  Procedure Laterality Date   GASTROSTOMY W/  FEEDING TUBE     LEFT HEART CATH AND CORONARY ANGIOGRAPHY N/A 05/25/2017   Procedure: LEFT HEART CATH AND CORONARY ANGIOGRAPHY;  Surgeon: Martinique, Peter M, MD;  Location: [place] [facility];  Service: Cardiovascular;  Laterality: N/A;   PORTACATH PLACEMENT     THROAT SURGERY      Family History  Problem Relation Age of Onset   Arthritis Mother    Hypertension Mother    Cancer - Other Maternal Grandmother     Social History   Socioeconomic History   Marital status: Married    Spouse name: Not on file   Number of children: Not on file   Years of education: Not on file   Highest education level: Not on file  Occupational History   Occupation: Retired  Tobacco Use   Smoking status: Never   Smokeless tobacco: Never  Vaping Use   Vaping Use: Never used  Substance and Sexual Activity   Alcohol use: No   Drug use: No   Sexual activity: Not on file  Other Topics Concern   Not on file  Social History Narrative   Not on file   Social Determinants of Health   Financial Resource Strain: Not on file  Food Insecurity: Not on file  Transportation Needs: Not on file  Physical Activity: Not on file  Stress: Not on file  Social Connections: Not on file  Intimate Partner Violence: Not on  file    Outpatient Medications Prior to Visit  Medication Sig Dispense Refill   amLODipine (NORVASC) 5 MG tablet TAKE 1 TABLET BY MOUTH DAILY 90 tablet 0   aspirin EC 81 MG tablet Take 81 mg by mouth daily.      furosemide (LASIX) 20 MG tablet Take 20 mg by mouth daily.     Glycerin-Hypromellose-PEG 400 ([facility] DRY EYE RELIEF OP) Place 2 drops into both eyes daily as needed (for dry eyes).     HYDROcodone-acetaminophen (NORCO/VICODIN) 5-325 MG tablet Take 1 tablet by mouth every 6 (six) hours as needed for moderate pain. 30 tablet 0   levothyroxine (SYNTHROID) 75 MCG tablet Take 75 mcg by mouth daily before breakfast.     meloxicam (MOBIC) 7.5 MG tablet TAKE 1 TABLET (7.5 MG TOTAL) BY MOUTH 2 (TWO)  TIMES DAILY. 180 tablet 1   nitroGLYCERIN (NITROSTAT) 0.4 MG SL tablet Place 0.4 mg under the tongue every 5 (five) minutes as needed for chest pain.      omeprazole (PRILOSEC) 40 MG capsule TAKE 1 CAPSULE BY MOUTH DAILY 90 capsule 1   PREDNISOLONE PO Take 50 mg by mouth as needed (Leg pain).     rosuvastatin (CRESTOR) 20 MG tablet TAKE ONE TABLET BY MOUTH DAILY 90 tablet 1   sildenafil (VIAGRA) 100 MG tablet Take 1 tablet (100 mg total) by mouth daily as needed for erectile dysfunction. Take 1/2 to 1 tablet 30-60 minutes prior to intercourse. 10 tablet 5   testosterone cypionate (DEPOTESTOSTERONE CYPIONATE) 200 MG/ML injection INJECT 1ML INTO THE MUSCLE EVERY 14 DAYS 6 mL 1   tiZANidine (ZANAFLEX) 4 MG tablet TAKE ONE TABLET BY MOUTH EVERY SIX (6) HOURS AS NEEDED FOR MUSCLE SPASMS 30 tablet 2   Vitamin D, Ergocalciferol, (DRISDOL) 1.25 MG (50000 UNIT) CAPS capsule TAKE 1 CAPSULE (50,000 UNITS TOTAL) BY MOUTH EVERY SUNDAY. 12 capsule 1   guaiFENesin-codeine (ROBITUSSIN AC) 100-10 MG/5ML syrup Take 5 mLs by mouth 2 (two) times daily as needed for cough.     Facility-Administered Medications Prior to Visit  Medication Dose Route Frequency Provider Last Rate Last Admin   testosterone cypionate (DEPOTESTOSTERONE CYPIONATE) injection 200 mg  200 mg Intramuscular Q14 Days Nailah Luepke, MD   200 mg at 10/02/20 1500    No Known Allergies  Review of Systems  Constitutional:  Positive for fatigue. Negative for chills, diaphoresis and fever.  HENT:  Negative for congestion, ear pain and sore throat.   Respiratory:  Negative for cough and shortness of breath.   Cardiovascular:  Negative for chest pain and leg swelling.  Musculoskeletal:  Positive for back pain (lower). Negative for arthralgias and myalgias.  Neurological:  Negative for headaches.  Psychiatric/Behavioral:  Positive for sleep disturbance. Negative for dysphoric mood.       Objective:    Physical Exam Vitals reviewed.   Constitutional:      Appearance: Normal appearance.  Neck:     Vascular: No carotid bruit.  Cardiovascular:     Rate and Rhythm: Normal rate and regular rhythm.     Heart sounds: Normal heart sounds.  Pulmonary:     Effort: Pulmonary effort is normal.     Breath sounds: Normal breath sounds. No wheezing, rhonchi or rales.  Abdominal:     General: Bowel sounds are normal.     Palpations: Abdomen is soft.     Tenderness: There is no abdominal tenderness.  Musculoskeletal:        General: Tenderness (lumbar.neg SLR BL.)  present.  Neurological:     Mental Status: Mark Mejia is alert.  Psychiatric:        Mood and Affect: Mood normal.        Behavior: Behavior normal.    Pulse 61   Temp (!) 95.7 F (35.4 C)   Ht _0  (1.803 m)   Wt 212 lb (96.2 kg)   SpO2 100%   BMI 29.57 kg/m  Wt Readings from Last 3 Encounters:  11/26/20 212 lb (96.2 kg)  07/23/20 206 lb 14.4 oz (93.8 kg)  07/17/20 209 lb (94.8 kg)    Health Maintenance Due  Topic Date Due   Zoster Vaccines- Shingrix (1 of 2) Never done   COVID-19 Vaccine (4 - Booster for Moderna series) 04/11/2020    There are no preventive care reminders to display for this patient.   Lab Results  Component Value Date   TSH 3.490 07/03/2020   Lab Results  Component Value Date   WBC 5.8 07/03/2020   HGB 13.9 07/03/2020   HCT 42.0 07/03/2020   MCV 84 07/03/2020   PLT 145 (L) 07/03/2020   Lab Results  Component Value Date   NA 141 07/03/2020   K 4.1 07/03/2020   CO2 21 07/03/2020   GLUCOSE 83 07/03/2020   BUN 14 07/03/2020   CREATININE 1.04 07/03/2020   BILITOT 0.5 07/03/2020   ALKPHOS 68 07/03/2020   AST 32 07/03/2020   ALT 31 07/03/2020   PROT 7.5 07/03/2020   ALBUMIN 4.8 07/03/2020   CALCIUM 9.9 07/03/2020   EGFR 79 07/03/2020   Lab Results  Component Value Date   CHOL 121 07/03/2020   Lab Results  Component Value Date   HDL 30 (L) 07/03/2020   Lab Results  Component Value Date   LDLCALC 65 07/03/2020    Lab Results  Component Value Date   TRIG 146 07/03/2020   Lab Results  Component Value Date   CHOLHDL 4.0 07/03/2020   No results found for: HGBA1C     Assessment & Plan:   Problem List Items Addressed This Visit       Cardiovascular and Mediastinum   Peripheral vascular disease, asymptomatic ([place])    Continue aspirin and crestor.         Other   Dyspnea on exertion   Benign prostatic hyperplasia with nocturia    Start flomax 0.4 mg once at bedtime. Hopefully, by decreasing nocturia this will help pt's sleep. Previous home sleep study done several years ago was inconclusive due to him getting up so many times.       Apnea - Primary    Check sleep study.       Relevant Orders   Ambulatory referral to Sleep Studies   Snoring    Check sleep study.       Relevant Orders   Ambulatory referral to Sleep Studies   Somnolence, daytime    Check sleep study.  Previous home sleep study done several years ago was inconclusive due to him getting up so many times. Hopefully Tamsulosin will help.        Relevant Orders   Ambulatory referral to Sleep Studies   Lumbar back pain    Continue meloxicam 15 mg once daily.  Use tylenol 1 every 4-6 hours as needed.  May use ice or heat on back.  Get back xray at [facility].        Relevant Orders   DG Lumbar Spine Complete   Other fatigue  Check sleep study.  Check labs.      Relevant Orders   CBC with Differential/Platelet   Comprehensive metabolic panel   TSH   Need for immunization against influenza   Relevant Orders   Flu Vaccine QUAD High Dose(Fluad) (Completed)    Follow-up: Return in about 4 weeks (around 12/24/2020) for chronic fasting.  An After Visit Summary was printed and given to the patient.  Rochel Brome, MD [facility] [phone]

## 2020-11-26 NOTE — Assessment & Plan Note (Signed)
Start flomax 0.4 mg once at bedtime. Hopefully, by decreasing nocturia this will help pt's sleep. Previous home sleep study done several years ago was inconclusive due to him getting up so many times.

## 2020-11-26 NOTE — Assessment & Plan Note (Signed)
Continue meloxicam 15 mg once daily.  Use tylenol 1 every 4-6 hours as needed.  May use ice or heat on back.  Get back xray at [facility].

## 2020-11-26 NOTE — Assessment & Plan Note (Signed)
Continue aspirin and crestor.   

## 2020-11-27 ENCOUNTER — Telehealth: Payer: Self-pay | Admitting: *Deleted

## 2020-11-27 LAB — COMPREHENSIVE METABOLIC PANEL
ALT: 24 IU/L (ref 0–44)
AST: 31 IU/L (ref 0–40)
Albumin/Globulin Ratio: 1.6 (ref 1.2–2.2)
Albumin: 4.4 g/dL (ref 3.8–4.8)
Alkaline Phosphatase: 61 IU/L (ref 44–121)
BUN/Creatinine Ratio: 11 (ref 10–24)
BUN: 12 mg/dL (ref 8–27)
Bilirubin Total: 0.4 mg/dL (ref 0.0–1.2)
CO2: 26 mmol/L (ref 20–29)
Calcium: 9.8 mg/dL (ref 8.6–10.2)
Chloride: 103 mmol/L (ref 96–106)
Creatinine, Ser: 1.07 mg/dL (ref 0.76–1.27)
Globulin, Total: 2.7 g/dL (ref 1.5–4.5)
Glucose: 84 mg/dL (ref 70–99)
Potassium: 4.4 mmol/L (ref 3.5–5.2)
Sodium: 142 mmol/L (ref 134–144)
Total Protein: 7.1 g/dL (ref 6.0–8.5)
eGFR: 77 mL/min/{1.73_m2} (ref >59)

## 2020-11-27 LAB — CBC WITH DIFFERENTIAL/PLATELET
Basophils Absolute: 0.1 10*3/uL (ref 0.0–0.2)
Basos: 1 %
EOS (ABSOLUTE): 0.2 10*3/uL (ref 0.0–0.4)
Eos: 4 %
Hematocrit: 44.1 % (ref 37.5–51.0)
Hemoglobin: 14.1 g/dL (ref 13.0–17.7)
Immature Grans (Abs): 0 10*3/uL (ref 0.0–0.1)
Immature Granulocytes: 0 %
Lymphocytes Absolute: 2 10*3/uL (ref 0.7–3.1)
Lymphs: 49 %
MCH: 26.4 pg — ABNORMAL LOW (ref 26.6–33.0)
MCHC: 32 g/dL (ref 31.5–35.7)
MCV: 82 fL (ref 79–97)
Monocytes Absolute: 0.3 10*3/uL (ref 0.1–0.9)
Monocytes: 6 %
Neutrophils Absolute: 1.7 10*3/uL (ref 1.4–7.0)
Neutrophils: 40 %
Platelets: 146 10*3/uL — ABNORMAL LOW (ref 150–450)
RBC: 5.35 x10E6/uL (ref 4.14–5.80)
RDW: 14.5 % (ref 11.6–15.4)
WBC: 4.2 10*3/uL (ref 3.4–10.8)

## 2020-11-27 LAB — TSH: TSH: 4.39 u[IU]/mL (ref 0.450–4.500)

## 2020-11-27 NOTE — Chronic Care Management (AMB) (Signed)
  Chronic Care Management   Outreach Note  11/27/2020 Name: MURTAZA SHELL MRN: [id number] DOB: 1955/07/29  DEZMON CONOVER is a 65 y.o. year old male who is a primary care patient of Cox, Kirsten, MD. I reached out to [name] by phone today in response to a referral sent by Mr. [place] PCP.     An unsuccessful telephone outreach was attempted today. The patient was referred to the case management team for assistance with care management and care coordination.   Follow Up Plan: A HIPAA compliant phone message was left for the patient providing contact information and requesting a return call. The care management team will reach out to the patient again over the next 7 days.  If patient returns call to provider office, please advise to call [place] at [phone].  [place] Management  Direct Dial: [phone]

## 2020-12-02 ENCOUNTER — Other Ambulatory Visit: Payer: Self-pay

## 2020-12-02 DIAGNOSIS — M545 Low back pain, unspecified: Secondary | ICD-10-CM

## 2020-12-04 NOTE — Chronic Care Management (AMB) (Signed)
  Chronic Care Management   Outreach Note  12/04/2020 Name: JELANI TRUEBA MRN: [id number] DOB: 04/13/1955  ADAIR LAUDERBACK is a 65 y.o. year old male who is a primary care patient of Cox, Kirsten, MD. I reached out to [name] by phone today in response to a referral sent by Mr. Clotilde Dieter Shek's primary care provider.  A second unsuccessful telephone outreach was attempted today. The patient was referred to the case management team for assistance with care management and care coordination.   Follow Up Plan: A HIPAA compliant phone message was left for the patient providing contact information and requesting a return call. The care management team will reach out to the patient again over the next 7 days.  If patient returns call to provider office, please advise to call [place] at [phone].  [place] Management  Direct Dial: [phone]

## 2020-12-08 ENCOUNTER — Telehealth: Payer: Self-pay | Admitting: Family Medicine

## 2020-12-08 NOTE — Chronic Care Management (AMB) (Signed)
  Chronic Care Management   Note  12/08/2020 Name: Mark Mejia MRN: [id number] DOB: 20-Jun-1955  Mark Mejia is a 65 y.o. year old male who is a primary care patient of Cox, Kirsten, MD. I reached out to Mark Mejia by phone today in response to a referral sent by Mark Mejia PCP.  Mark Mejia was given information about Chronic Care Management services today including:  CCM service includes personalized support from designated clinical staff supervised by his physician, including individualized plan of care and coordination with other care providers 24/7 contact phone numbers for assistance for urgent and routine care needs. Service will only be billed when office clinical staff spend 20 minutes or more in a month to coordinate care. Only one practitioner may furnish and bill the service in a calendar month. The patient may stop CCM services at any time (effective at the end of the month) by phone call to the office staff. The patient is responsible for co-pay (up to 20% after annual deductible is met) if co-pay is required by the individual health plan.   Patient agreed to services and verbal consent obtained.   Follow up plan: Telephone appointment with care management team member scheduled for:12/18/20  [place] Management  Direct Dial: [phone]

## 2020-12-08 NOTE — Chronic Care Management (AMB) (Signed)
  Chronic Care Management   Note  12/08/2020 Name: CORRION STIREWALT MRN: [id number] DOB: January 03, 1956  DEMONTREZ RINDFLEISCH is a 65 y.o. year old male who is a primary care patient of Cox, Kirsten, MD. I reached out to [name] by phone today in response to a referral sent by Mr. Andre Lefort PCP, Cox, Kirsten, MD.   Mr. Bonn was given information about Chronic Care Management services today including:  CCM service includes personalized support from designated clinical staff supervised by his physician, including individualized plan of care and coordination with other care providers 24/7 contact phone numbers for assistance for urgent and routine care needs. Service will only be billed when office clinical staff spend 20 minutes or more in a month to coordinate care. Only one practitioner may furnish and bill the service in a calendar month. The patient may stop CCM services at any time (effective at the end of the month) by phone call to the office staff.   Patient agreed to services and verbal consent obtained.   Follow up plan: PT AWARE $15 COPAY   Tatjana Dellinger [employer] Scheduler

## 2020-12-09 DIAGNOSIS — M79662 Pain in left lower leg: Secondary | ICD-10-CM | POA: Diagnosis not present

## 2020-12-09 DIAGNOSIS — M79604 Pain in right leg: Secondary | ICD-10-CM | POA: Diagnosis not present

## 2020-12-09 DIAGNOSIS — M545 Low back pain, unspecified: Secondary | ICD-10-CM | POA: Diagnosis not present

## 2020-12-11 ENCOUNTER — Ambulatory Visit (INDEPENDENT_AMBULATORY_CARE_PROVIDER_SITE_OTHER): Payer: Medicare PPO

## 2020-12-11 ENCOUNTER — Other Ambulatory Visit: Payer: Self-pay

## 2020-12-11 DIAGNOSIS — E291 Testicular hypofunction: Secondary | ICD-10-CM

## 2020-12-11 NOTE — Progress Notes (Signed)
   Testosterone injection given per order, patient tolerated well.   Erie Noe, LPN 9:41 PM

## 2020-12-17 DIAGNOSIS — M79662 Pain in left lower leg: Secondary | ICD-10-CM | POA: Diagnosis not present

## 2020-12-17 DIAGNOSIS — M545 Low back pain, unspecified: Secondary | ICD-10-CM | POA: Diagnosis not present

## 2020-12-17 DIAGNOSIS — M79604 Pain in right leg: Secondary | ICD-10-CM | POA: Diagnosis not present

## 2020-12-18 ENCOUNTER — Telehealth: Payer: Self-pay

## 2020-12-18 ENCOUNTER — Telehealth: Payer: Medicare PPO

## 2020-12-18 NOTE — Telephone Encounter (Signed)
  Care Management   Follow Up Note   12/18/2020 Name: Mark Mejia MRN: [id number] DOB: 15-Jun-1955   Referred by: Rochel Brome, MD Reason for referral : Chronic Care Management   An unsuccessful telephone outreach was attempted today. The patient was referred to the case management team for assistance with care management and care coordination.   No answer at home number, Left a voicemail on cell phone  Follow Up Plan: The care management team will reach out to the patient again over the next 7 days.   Tomasa Rand RN, BSN, CEN RN Case Freight forwarder - [facility] [employer] exhibitions officer Mobile: [phone]

## 2020-12-19 ENCOUNTER — Telehealth: Payer: Self-pay

## 2020-12-19 NOTE — Chronic Care Management (AMB) (Signed)
Pt initial appt has been rescheduled from 12/26/20 to 12/25/20 @ 8:30 am. Pt confirmed appt.  Mark Mejia, [place] Pharmacist Assistant  [phone]

## 2020-12-22 ENCOUNTER — Telehealth: Payer: Self-pay

## 2020-12-22 ENCOUNTER — Encounter: Payer: Self-pay | Admitting: Family Medicine

## 2020-12-22 NOTE — Chronic Care Management (AMB) (Signed)
Chronic Care Management Pharmacy Assistant   Name: Mark Mejia  MRN: [id number] DOB: 01/03/1956  Reason for Encounter: Chart Prep for initial appt with CPP on 12/25/20   Conditions to be addressed/monitored: CAD, HLD, and GERD, Hepatitis C, Apnea, Back pain, Peripheral vascular disease, Male Hypogonadism.  Recent office visits:  12/11/20 Mark Hammock LPN. Seen for Male Hypogonadism. Testosterone Injection given. No med changes.  11/26/20 Mark Brome MD. Seen for Apnea. Referral to [facility] Coordination. Referred to Sleep Studies. Started on Tamsulosin HCI 0.4 mg daily.  10/02/20 Mark Hammock LPN. Seen for Male Hypogonadism. Testosterone Injection given. No med changes.  09/18/20 Mark Hammock LPN. Seen for Male Hypogonadism. Testosterone Injection given. No med changes.  09/04/20 Mark Hammock LPN. Seen for Male Hypogonadism. Testosterone Injection given. No med changes.  08/21/20 Mark Hammock LPN. Seen for Male Hypogonadism. Testosterone Injection given. No med changes.  07/03/20 Mark Brome MD. Seen for Routine Visit. Started Diclofenac Sodium 4 g topical 4 times daily. D/C  Meloxicam 7.5 2 times daily due to ineffective. Completed course of Prednisone 50 mg daily.  Recent consult visits:  07/23/20 (Vasc Surg) Mark Face PA-C Seen for Bilateral Carotid Artery Stenosis. No med changes.  07/17/20 (Cardiology) Mark Campus MD. Seen for Angina Pectoris. Changed Furosemide 20 mg from twice daily to 20 mg daily. Pt reported taking Guaifenesin-Codeine 5 mls 2 times daily prn instead of 3 times daily.  Hospital visits:  None in previous 6 months  Medications: Outpatient Encounter Medications as of 12/22/2020  Medication Sig   amLODipine (NORVASC) 5 MG tablet TAKE 1 TABLET BY MOUTH DAILY   aspirin EC 81 MG tablet Take 81 mg by mouth daily.    furosemide (LASIX) 20 MG tablet Take 20 mg by mouth daily.   Glycerin-Hypromellose-PEG 400 ([facility] DRY EYE RELIEF  OP) Place 2 drops into both eyes daily as needed (for dry eyes).   HYDROcodone-acetaminophen (NORCO/VICODIN) 5-325 MG tablet Take 1 tablet by mouth every 6 (six) hours as needed for moderate pain.   levothyroxine (SYNTHROID) 75 MCG tablet Take 75 mcg by mouth daily before breakfast.   meloxicam (MOBIC) 7.5 MG tablet TAKE 1 TABLET (7.5 MG TOTAL) BY MOUTH 2 (TWO) TIMES DAILY.   nitroGLYCERIN (NITROSTAT) 0.4 MG SL tablet Place 0.4 mg under the tongue every 5 (five) minutes as needed for chest pain.    omeprazole (PRILOSEC) 40 MG capsule TAKE 1 CAPSULE BY MOUTH DAILY   PREDNISOLONE PO Take 50 mg by mouth as needed (Leg pain).   rosuvastatin (CRESTOR) 20 MG tablet TAKE ONE TABLET BY MOUTH DAILY   sildenafil (VIAGRA) 100 MG tablet Take 1 tablet (100 mg total) by mouth daily as needed for erectile dysfunction. Take 1/2 to 1 tablet 30-60 minutes prior to intercourse.   tamsulosin (FLOMAX) 0.4 MG CAPS capsule Take 1 capsule (0.4 mg total) by mouth daily.   testosterone cypionate (DEPOTESTOSTERONE CYPIONATE) 200 MG/ML injection INJECT 1ML INTO THE MUSCLE EVERY 14 DAYS   tiZANidine (ZANAFLEX) 4 MG tablet TAKE ONE TABLET BY MOUTH EVERY SIX (6) HOURS AS NEEDED FOR MUSCLE SPASMS   Vitamin D, Ergocalciferol, (DRISDOL) 1.25 MG (50000 UNIT) CAPS capsule TAKE 1 CAPSULE (50,000 UNITS TOTAL) BY MOUTH EVERY SUNDAY.   Facility-Administered Encounter Medications as of 12/22/2020  Medication   testosterone cypionate (DEPOTESTOSTERONE CYPIONATE) injection 200 mg    No results found for: HGBA1C, MICROALBUR   BP Readings from Last 3 Encounters:  11/26/20 136/84  07/23/20 (!) 145/81  07/17/20 120/80  Patient Questions:   Have you seen any other providers since your last visit with PCP? Pt stated no   Any changes in your medications or health? No changes   Any side effects from any medications? Pt is not aware of any side effects  Do you have an symptoms or problems not managed by your medications? No other  issues   Any concerns about your health right now? Pt is concerned about him not sleeping at night   Has your provider asked that you check blood pressure, blood sugar, or follow special diet at home? Supposed to follow a heart healthy diet and exercising more   Do you get any type of exercise on a regular basis? Pt is not getting a lot of exercise due to pain in his lower extremities   Can you think of a goal you would like to reach for your health? Pt wants to be able to sleep at night. Pt cannot sleep through the night  Do you have any problems getting your medications? No issues   Is there anything that you would like to discuss during the appointment? Pt wanted to know his recent xray results and what they diagnosed him    Mark Mejia was reminded to have all medications, supplements and any blood glucose and blood pressure readings available for review with Mark Mejia Pharm. D, at his telephone visit on 12/25/20 at 8:30 am .    Star Rating Drugs:  Medication:  Last Fill: Day Supply Rosuvastatin   12/11/19 90ds    Care Gaps: Last annual wellness visit? 97/3/53 If applicable: N/A Last eye exam / retinopathy screening? Last diabetic foot exam?    Elray Mcgregor, [place] Pharmacist Assistant  [phone]

## 2020-12-25 ENCOUNTER — Ambulatory Visit (INDEPENDENT_AMBULATORY_CARE_PROVIDER_SITE_OTHER): Payer: Medicare PPO

## 2020-12-25 ENCOUNTER — Other Ambulatory Visit: Payer: Self-pay

## 2020-12-25 DIAGNOSIS — E782 Mixed hyperlipidemia: Secondary | ICD-10-CM

## 2020-12-25 DIAGNOSIS — N401 Enlarged prostate with lower urinary tract symptoms: Secondary | ICD-10-CM

## 2020-12-25 DIAGNOSIS — I5030 Unspecified diastolic (congestive) heart failure: Secondary | ICD-10-CM

## 2020-12-25 DIAGNOSIS — I110 Hypertensive heart disease with heart failure: Secondary | ICD-10-CM

## 2020-12-25 NOTE — Progress Notes (Signed)
Chronic Care Management Pharmacy Note  12/25/2020 Name:  Mark Mejia MRN:  [id number] DOB:  1956/02/15  Summary: -Pleasant 65 year old male presents for initial CCM visit. He was born in [place] and moved to [place] in 1977 when he joined the [employer] and was stationed in [place]. We spoke extensively about our time in the service. Patient is also a Engineer, maintenance and loves watching them, however he has trouble staying up through the whole game  Recommendations/Changes made from today's visit: -Counsled on [place] as supplemental therapy since his CC is nocturnal urination  Subjective: Mark Mejia is an 65 y.o. year old male who is a primary patient of Cox, Kirsten, MD.  The CCM team was consulted for assistance with disease management and care coordination needs.    Engaged with patient by telephone for initial visit in response to provider referral for pharmacy case management and/or care coordination services.   Consent to Services:  The patient was given the following information about Chronic Care Management services today, agreed to services, and gave verbal consent: 1. CCM service includes personalized support from designated clinical staff supervised by the primary care provider, including individualized plan of care and coordination with other care providers 2. 24/7 contact phone numbers for assistance for urgent and routine care needs. 3. Service will only be billed when office clinical staff spend 20 minutes or more in a month to coordinate care. 4. Only one practitioner may furnish and bill the service in a calendar month. 5.The patient may stop CCM services at any time (effective at the end of the month) by phone call to the office staff. 6. The patient will be responsible for cost sharing (co-pay) of up to 20% of the service fee (after annual deductible is met). Patient agreed to services and consent obtained.  Patient Care Team: Rochel Brome, MD as PCP - General (Family  Medicine) Park Liter, MD as Consulting Physician (Cardiology) Thana Ates, RN as [place] Management Lane Hacker, [facility] as Consulting Physician (Pharmacist)  Recent office visits:  12/11/20 Rhae Hammock LPN. Seen for Male Hypogonadism. Testosterone Injection given. No med changes.   11/26/20 Rochel Brome MD. Seen for Apnea. Referral to [facility] Coordination. Referred to Sleep Studies. Started on Tamsulosin HCI 0.4 mg daily.   10/02/20 Rhae Hammock LPN. Seen for Male Hypogonadism. Testosterone Injection given. No med changes.   09/18/20 Rhae Hammock LPN. Seen for Male Hypogonadism. Testosterone Injection given. No med changes.   09/04/20 Rhae Hammock LPN. Seen for Male Hypogonadism. Testosterone Injection given. No med changes.   08/21/20 Rhae Hammock LPN. Seen for Male Hypogonadism. Testosterone Injection given. No med changes.   07/03/20 Rochel Brome MD. Seen for Routine Visit. Started Diclofenac Sodium 4 g topical 4 times daily. D/C  Meloxicam 7.5 2 times daily due to ineffective. Completed course of Prednisone 50 mg daily.   Recent consult visits:  07/23/20 (Vasc Surg) Gaynell Face PA-C Seen for Bilateral Carotid Artery Stenosis. No med changes.   07/17/20 (Cardiology) Jenne Campus MD. Seen for Angina Pectoris. Changed Furosemide 20 mg from twice daily to 20 mg daily. Pt reported taking Guaifenesin-Codeine 5 mls 2 times daily prn instead of 3 times daily.   Hospital visits:  None in previous 6 months   Objective:  Lab Results  Component Value Date   CREATININE 1.07 11/26/2020   BUN 12 11/26/2020   GFRNONAA 64 12/14/2019   GFRAA 74 12/14/2019   NA 142  11/26/2020   K 4.4 11/26/2020   CALCIUM 9.8 11/26/2020   CO2 26 11/26/2020   GLUCOSE 84 11/26/2020    No results found for: HGBA1C, FRUCTOSAMINE, GFR, MICROALBUR  Last diabetic Eye exam: No results found for: HMDIABEYEEXA  Last diabetic Foot exam: No results  found for: HMDIABFOOTEX   Lab Results  Component Value Date   CHOL 121 07/03/2020   HDL 30 (L) 07/03/2020   LDLCALC 65 07/03/2020   TRIG 146 07/03/2020   CHOLHDL 4.0 07/03/2020    Hepatic Function Latest Ref Rng & Units 11/26/2020 07/03/2020 12/14/2019  Total Protein 6.0 - 8.5 g/dL 7.1 7.5 7.6  Albumin 3.8 - 4.8 g/dL 4.4 4.8 4.5  AST 0 - 40 IU/L 31 32 32  ALT 0 - 44 IU/L 24 31 23   Alk Phosphatase 44 - 121 IU/L 61 68 60  Total Bilirubin 0.0 - 1.2 mg/dL 0.4 0.5 0.5  Bilirubin, Direct 0.00 - 0.40 mg/dL - - -    Lab Results  Component Value Date/Time   TSH 4.390 11/26/2020 12:07 PM   TSH 3.490 07/03/2020 03:16 PM    CBC Latest Ref Rng & Units 11/26/2020 07/03/2020 12/14/2019  WBC 3.4 - 10.8 x10E3/uL 4.2 5.8 4.2  Hemoglobin 13.0 - 17.7 g/dL 14.1 13.9 12.9(L)  Hematocrit 37.5 - 51.0 % 44.1 42.0 39.8  Platelets 150 - 450 x10E3/uL 146(L) 145(L) 139(L)    No results found for: VD25OH  Clinical ASCVD: Yes  The ASCVD Risk score (Arnett DK, et al., 2019) failed to calculate for the following reasons:   The valid total cholesterol range is 130 to 320 mg/dL    Depression screen [facility] 2/9 11/26/2020 07/03/2020 01/01/2020  Decreased Interest 0 0 0  Down, Depressed, Hopeless 0 0 0  PHQ - 2 Score 0 0 0     Other: (CHADS2VASc if Afib, MMRC or CAT for COPD, ACT, DEXA)  Social History   Tobacco Use  Smoking Status Never  Smokeless Tobacco Never   BP Readings from Last 3 Encounters:  11/26/20 136/84  07/23/20 (!) 145/81  07/17/20 120/80   Pulse Readings from Last 3 Encounters:  11/26/20 61  07/23/20 (!) 59  07/17/20 60   Wt Readings from Last 3 Encounters:  11/26/20 212 lb (96.2 kg)  07/23/20 206 lb 14.4 oz (93.8 kg)  07/17/20 209 lb (94.8 kg)   BMI Readings from Last 3 Encounters:  11/26/20 29.57 kg/m  07/23/20 29.69 kg/m  07/17/20 29.99 kg/m    Assessment/Interventions: Review of patient past medical history, allergies, medications, health status, including review of  consultants reports, laboratory and other test data, was performed as part of comprehensive evaluation and provision of chronic care management services.   SDOH:  (Social Determinants of Health) assessments and interventions performed: Yes SDOH Interventions    Flowsheet Row Most Recent Value  SDOH Interventions   Financial Strain Interventions Intervention Not Indicated      SDOH Screenings   Alcohol Screen: Low Risk    Last Alcohol Screening Score (AUDIT): 0  Depression (PHQ2-9): Low Risk    PHQ-2 Score: 0  Financial Resource Strain: Low Risk    Difficulty of Paying Living Expenses: Not hard at all  Food Insecurity: Not on file  Housing: Not on file  Physical Activity: Not on file  Social Connections: Not on file  Stress: Not on file  Tobacco Use: Low Risk    Smoking Tobacco Use: Never   Smokeless Tobacco Use: Never   Passive Exposure: Not on  file  Transportation Needs: Not on file    [place]  No Known Allergies  Medications Reviewed Today     Reviewed by Lane Hacker, [facility] (Pharmacist) on 12/25/20 at 0927  Med List Status: <None>   Medication Order Taking? Sig Documenting Provider Last Dose Status Informant  amLODipine (NORVASC) 5 MG tablet [id number] Yes TAKE 1 TABLET BY MOUTH DAILY Bohden, Dung, PA-C Taking Active   aspirin EC 81 MG tablet [id number] Yes Take 81 mg by mouth daily.  [provider] Taking Active Self  furosemide (LASIX) 20 MG tablet [id number] Yes Take 20 mg by mouth daily. [provider] Taking Active   Glycerin-Hypromellose-PEG 400 ([facility] DRY EYE RELIEF OP) [id number] Yes Place 2 drops into both eyes daily as needed (for dry eyes). [provider] Taking Active Self  HYDROcodone-acetaminophen (NORCO/VICODIN) 5-325 MG tablet [id number] Yes Take 1 tablet by mouth every 6 (six) hours as needed for moderate pain. Cox, Kirsten, MD Taking Active   levothyroxine (SYNTHROID) 75 MCG tablet [id number] Yes Take 75 mcg by mouth daily  before breakfast. [provider] Taking Active   meloxicam (MOBIC) 7.5 MG tablet [id number] Yes TAKE 1 TABLET (7.5 MG TOTAL) BY MOUTH 2 (TWO) TIMES DAILY. Rip Harbour, NP Taking Active   nitroGLYCERIN (NITROSTAT) 0.4 MG SL tablet [id number]  Place 0.4 mg under the tongue every 5 (five) minutes as needed for chest pain.  [provider]  Active Self  omeprazole (PRILOSEC) 40 MG capsule [id number] Yes TAKE 1 CAPSULE BY MOUTH DAILY Park Liter, MD Taking Active   PREDNISOLONE PO [id number] Yes Take 50 mg by mouth as needed (Leg pain). [provider] Taking Active   rosuvastatin (CRESTOR) 20 MG tablet [id number] Yes TAKE ONE TABLET BY MOUTH DAILY Park Liter, MD Taking Active   sildenafil (VIAGRA) 100 MG tablet [id number] Yes Take 1 tablet (100 mg total) by mouth daily as needed for erectile dysfunction. Take 1/2 to 1 tablet 30-60 minutes prior to intercourse. Cox, Kirsten, MD Taking Active   tamsulosin [facility]) 0.4 MG CAPS capsule [id number] Yes Take 1 capsule (0.4 mg total) by mouth daily. Cox, Kirsten, MD Taking Active   testosterone cypionate (DEPOTESTOSTERONE CYPIONATE) 200 MG/ML injection [id number] Yes INJECT 1ML INTO THE MUSCLE EVERY 14 DAYS Cox, Kirsten, MD Taking Active   testosterone cypionate (DEPOTESTOSTERONE CYPIONATE) injection 200 mg [id number]   Rochel Brome, MD  Active   tiZANidine (ZANAFLEX) 4 MG tablet [id number] Yes TAKE ONE TABLET BY MOUTH EVERY SIX (6) HOURS AS NEEDED FOR MUSCLE SPASMS Cox, Kirsten, MD Taking Active   Vitamin D, Ergocalciferol, (DRISDOL) 1.25 MG (50000 UNIT) CAPS capsule [id number] Yes TAKE 1 CAPSULE (50,000 UNITS TOTAL) BY MOUTH EVERY SUNDAY. Marge Duncans, PA-C Taking Active             Patient Active Problem List   Diagnosis Date Noted   Apnea 11/26/2020   Snoring 11/26/2020   Somnolence, daytime 11/26/2020   Lumbar back pain 11/26/2020   Other fatigue 11/26/2020   Need for immunization against influenza 11/26/2020    Overweight (BMI 25.0-29.9) 11/26/2020   CAD (coronary artery disease)    GERD (gastroesophageal reflux disease)    Hepatitis C    History of esophageal cancer    Testicular hypofunction    Vitamin D deficiency    Mixed hyperlipidemia 06/14/2019   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/2019   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   Benign prostatic hyperplasia with nocturia 06/14/2019   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[place]) 06/14/2019   Chest pain of uncertain etiology 16/96/7893   Angina pectoris ([place]) 05/25/2017   Peripheral vascular disease, asymptomatic ([place]) 05/23/2017   Hypertrophic scar of skin 09/29/2016   Encounter for annual wellness exam in Medicare patient 03/10/2016   Encounter for adjustment and management of vascular access device 03/10/2016   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   Precordial chest pain 12/26/2015   Dyspnea on exertion 12/26/2015    Immunization History  Administered Date(s) Administered   Fluad Quad(high Dose 65+) 12/14/2019, 11/26/2020   Influenza-Unspecified 12/01/2018   Moderna Covid-19 Vaccine Bivalent Booster 24yr & up 11/26/2020   Moderna Sars-Covid-2 Vaccination 04/27/2019, 05/30/2019, 01/18/2020   Tdap 05/31/2011    Conditions to be addressed/monitored:  Hypertension, Hyperlipidemia, and Hypothyroidism  Care Plan : [place] Updates made by KLane Hacker RPH since 12/25/2020 12:00 AM     Problem: Pain, BPH, PVD   Priority: High  Onset Date: 12/25/2020     Goal: Disease State Management   Start Date: 12/25/2020  Expected End Date: 12/25/2021  This Visit's Progress: On track  Priority: High  Note:   Current Barriers:  Does not maintain contact with provider office Does not contact provider office for questions/concerns  Pharmacist Clinical Goal(s):  Patient will contact provider office for questions/concerns as evidenced notation of same in electronic health record through  collaboration with PharmD and provider.   Interventions: 1:1 collaboration with Cox, KElnita Maxwell MD regarding development and update of comprehensive plan of care as evidenced by provider attestation and co-signature Inter-disciplinary care team collaboration (see longitudinal plan of care) Comprehensive medication review performed; medication list updated in electronic medical record  Hypertension (BP goal <140/90) BP Readings from Last 3 Encounters:  11/26/20 136/84  07/23/20 (!) 145/81  07/17/20 120/80  -Controlled -Current treatment: Amlodipine 578m-Medications previously tried: N/A  -Current home readings: 137/78 but rarely checks -Current dietary habits: "Tries to eat healthy" -Current exercise habits: None due to pain -Denies hypotensive/hypertensive symptoms -Educated on BP goals and benefits of medications for prevention of heart attack, stroke and kidney damage; -Counseled to monitor BP at home weekly, document, and provide log at future appointments -Recommended to continue current medication. Patient states he does have leg swelling, could be due to Amlodpine. Will keep an eye on it  CAD (LDL goal < 100) The ASCVD Risk score (Arnett DK, et al., 2019) failed to calculate for the following reasons:   The valid total cholesterol range is 130 to 320 mg/dL Lab Results  Component Value Date   CHOL 121 07/03/2020   CHOL 105 12/14/2019   CHOL 103 06/14/2019   Lab Results  Component Value Date   HDL 30 (L) 07/03/2020   HDL 28 (L) 12/14/2019   HDL 28 (L) 06/14/2019   Lab Results  Component Value Date   LDLCALC 65 07/03/2020   LDLCALC 62 12/14/2019   LDLCALC 60 06/14/2019   Lab Results  Component Value Date   TRIG 146 07/03/2020   TRIG 68 12/14/2019   TRIG 70 06/14/2019   Lab Results  Component Value Date   CHOLHDL 4.0 07/03/2020   CHOLHDL 3.8 12/14/2019   CHOLHDL 3.7 06/14/2019  No results found for: LDLDIRECT  -Controlled -Current treatment: ASA  8118mosuvastatin 31m40medications previously tried: None  -Current dietary habits: "Tries to eat healthy" -Current exercise habits: None due to pain -Educated on Cholesterol goals;  October 2022: Counseled to take ASA separate from Meloxicam,  will take ASA PM and meloxicam first thing in AM  BPH (Goal: improve urination) -Not ideally controlled -Night time urination frequency: 2-3x/night but 8x/night without meds -Current treatment: Tamsulosin -Medications previously tried: N/A October 2022: Counsled on Kegels as supplemental therapy  Chronic Pain -Controlled -Current treatment: Meloxicam 71m QD APAP  Hydrocodone/APAP (Takes 1/6 months) Tizanidine 468mPrednisolone 5040mRN (This helps the best but he knows it's not good for him and takes rarely) -Medications previously tried: NSAID's  -Pain Scale If pain is 3-4/10, will take APAP and bring to 1-2/10. If 8-9/10, will take Tizanidine and bring to 1-2/10.  Aggravating Factors: Lack of sleep, movement Pain Type: musculo-skeletal  -Recommended to continue current medication    Patient Goals/Self-Care Activities Patient will:  - take medications as prescribed  Follow Up Plan: The patient has been provided with contact information for the care management team and has been advised to call with any health related questions or concerns.   [place] Constableharm.D. - [phone]  CPP F/U Feb 2023       Medication Assistance: None required.  Patient affirms current coverage meets needs.  Compliance/Adherence/Medication fill history:   Star Rating Drugs:  Medication:                Last Fill:         Day Supply Rosuvastatin               12/11/19          90ds       Care Gaps: Last annual wellness visit? 11/65/7/90 applicable: N/A Last eye exam / retinopathy screening? Last diabetic foot exam?  [facility] [place] [id number]: [phone]: [phone] pill box? Yes Pt endorses 100% compliance  Patient would like to use [facility] but is non-preferred. If his insurance changes, will onboard  Care Plan and Follow Up Patient Decision:  Patient agrees to Care Plan and Follow-up.  Plan: The patient has been provided with contact information for the care management team and has been advised to call with any health related questions or concerns.   [place] Constableharm.D. - [phone]  CPP F/U Feb 2023

## 2020-12-25 NOTE — Patient Instructions (Signed)
Visit Information   Goals Addressed             This Visit's Progress    Track and Manage My Blood Pressure-Hypertension       Timeframe:  Long-Range Goal Priority:  High Start Date:                             Expected End Date:                       Follow Up Date 12/25/21   - check blood pressure weekly - choose a place to take my blood pressure (home, clinic or office, retail store) - write blood pressure results in a log or diary    Why is this important?   You won't feel high blood pressure, but it can still hurt your blood vessels.  High blood pressure can cause heart or kidney problems. It can also cause a stroke.  Making lifestyle changes like losing a little weight or eating less salt will help.  Checking your blood pressure at home and at different times of the day can help to control blood pressure.  If the doctor prescribes medicine remember to take it the way the doctor ordered.  Call the office if you cannot afford the medicine or if there are questions about it.     Notes:        Patient Care Plan: CCM Pharmacy Care Plan     Problem Identified: Pain, BPH, PVD   Priority: High  Onset Date: 12/25/2020     Goal: Disease State Management   Start Date: 12/25/2020  Expected End Date: 12/25/2021  This Visit's Progress: On track  Priority: High  Note:   Current Barriers:  Does not maintain contact with provider office Does not contact provider office for questions/concerns  Pharmacist Clinical Goal(s):  Patient will contact provider office for questions/concerns as evidenced notation of same in electronic health record through collaboration with PharmD and provider.   Interventions: 1:1 collaboration with Cox, Elnita Maxwell, MD regarding development and update of comprehensive plan of care as evidenced by provider attestation and co-signature Inter-disciplinary care team collaboration (see longitudinal plan of care) Comprehensive medication review  performed; medication list updated in electronic medical record  Hypertension (BP goal <140/90) BP Readings from Last 3 Encounters:  11/26/20 136/84  07/23/20 (!) 145/81  07/17/20 120/80  -Controlled -Current treatment: Amlodipine 5mg  -Medications previously tried: N/A  -Current home readings: 137/78 but rarely checks -Current dietary habits: "Tries to eat healthy" -Current exercise habits: None due to pain -Denies hypotensive/hypertensive symptoms -Educated on BP goals and benefits of medications for prevention of heart attack, stroke and kidney damage; -Counseled to monitor BP at home weekly, document, and provide log at future appointments -Recommended to continue current medication. Patient states he does have leg swelling, could be due to Amlodpine. Will keep an eye on it  CAD (LDL goal < 100) The ASCVD Risk score (Arnett DK, et al., 2019) failed to calculate for the following reasons:   The valid total cholesterol range is 130 to 320 mg/dL Lab Results  Component Value Date   CHOL 121 07/03/2020   CHOL 105 12/14/2019   CHOL 103 06/14/2019   Lab Results  Component Value Date   HDL 30 (L) 07/03/2020   HDL 28 (L) 12/14/2019   HDL 28 (L) 06/14/2019   Lab Results  Component Value Date   LDLCALC 65  07/03/2020   LDLCALC 62 12/14/2019   LDLCALC 60 06/14/2019   Lab Results  Component Value Date   TRIG 146 07/03/2020   TRIG 68 12/14/2019   TRIG 70 06/14/2019   Lab Results  Component Value Date   CHOLHDL 4.0 07/03/2020   CHOLHDL 3.8 12/14/2019   CHOLHDL 3.7 06/14/2019  No results found for: LDLDIRECT  -Controlled -Current treatment: ASA 81mg  Rosuvastatin 20mg  -Medications previously tried: None  -Current dietary habits: "Tries to eat healthy" -Current exercise habits: None due to pain -Educated on Cholesterol goals;  October 2022: Counseled to take ASA separate from Meloxicam, will take ASA PM and meloxicam first thing in AM  BPH (Goal: improve  urination) -Not ideally controlled -Night time urination frequency: 2-3x/night but 8x/night without meds -Current treatment: Tamsulosin -Medications previously tried: N/A October 2022: Counsled on Kegels as supplemental therapy  Chronic Pain -Controlled -Current treatment: Meloxicam 15mg  QD APAP  Hydrocodone/APAP (Takes 1/6 months) Tizanidine 4mg  Prednisolone 50mg  PRN (This helps the best but he knows it's not good for him and takes rarely) -Medications previously tried: NSAID's  -Pain Scale If pain is 3-4/10, will take APAP and bring to 1-2/10. If 8-9/10, will take Tizanidine and bring to 1-2/10.  Aggravating Factors: Lack of sleep, movement Pain Type: musculo-skeletal  -Recommended to continue current medication    Patient Goals/Self-Care Activities Patient will:  - take medications as prescribed  Follow Up Plan: The patient has been provided with contact information for the care management team and has been advised to call with any health related questions or concerns.   [place] Constable, Pharm.D. - [phone]  CPP F/U Feb 2023      Mr. Seals was given information about Chronic Care Management services today including:  CCM service includes personalized support from designated clinical staff supervised by his physician, including individualized plan of care and coordination with other care providers 24/7 contact phone numbers for assistance for urgent and routine care needs. Standard insurance, coinsurance, copays and deductibles apply for chronic care management only during months in which we provide at least 20 minutes of these services. Most insurances cover these services at 100%, however patients may be responsible for any copay, coinsurance and/or deductible if applicable. This service may help you avoid the need for more expensive face-to-face services. Only one practitioner may furnish and bill the service in a calendar month. The patient may stop CCM services at  any time (effective at the end of the month) by phone call to the office staff.  Patient agreed to services and verbal consent obtained.   The patient verbalized understanding of instructions, educational materials, and care plan provided today and declined offer to receive copy of patient instructions, educational materials, and care plan.  The pharmacy team will reach out to the patient again over the next 90 days.   Lane Hacker, [facility]

## 2020-12-26 ENCOUNTER — Telehealth: Payer: Medicare PPO

## 2020-12-29 DIAGNOSIS — I5030 Unspecified diastolic (congestive) heart failure: Secondary | ICD-10-CM

## 2020-12-29 DIAGNOSIS — I110 Hypertensive heart disease with heart failure: Secondary | ICD-10-CM | POA: Diagnosis not present

## 2020-12-29 DIAGNOSIS — R351 Nocturia: Secondary | ICD-10-CM | POA: Diagnosis not present

## 2020-12-29 DIAGNOSIS — E782 Mixed hyperlipidemia: Secondary | ICD-10-CM | POA: Diagnosis not present

## 2020-12-29 DIAGNOSIS — N401 Enlarged prostate with lower urinary tract symptoms: Secondary | ICD-10-CM

## 2021-01-02 ENCOUNTER — Other Ambulatory Visit: Payer: Self-pay

## 2021-01-02 ENCOUNTER — Encounter: Payer: Self-pay | Admitting: Family Medicine

## 2021-01-02 ENCOUNTER — Ambulatory Visit (INDEPENDENT_AMBULATORY_CARE_PROVIDER_SITE_OTHER): Payer: Medicare PPO | Admitting: Family Medicine

## 2021-01-02 VITALS — BP 108/70 | HR 60 | Temp 96.3°F | Resp 16 | Ht 71.0 in | Wt 215.0 lb

## 2021-01-02 DIAGNOSIS — I110 Hypertensive heart disease with heart failure: Secondary | ICD-10-CM | POA: Diagnosis not present

## 2021-01-02 DIAGNOSIS — I70213 Atherosclerosis of native arteries of extremities with intermittent claudication, bilateral legs: Secondary | ICD-10-CM | POA: Diagnosis not present

## 2021-01-02 DIAGNOSIS — E034 Atrophy of thyroid (acquired): Secondary | ICD-10-CM | POA: Diagnosis not present

## 2021-01-02 DIAGNOSIS — I700 Atherosclerosis of aorta: Secondary | ICD-10-CM | POA: Diagnosis not present

## 2021-01-02 DIAGNOSIS — E291 Testicular hypofunction: Secondary | ICD-10-CM | POA: Diagnosis not present

## 2021-01-02 DIAGNOSIS — I5030 Unspecified diastolic (congestive) heart failure: Secondary | ICD-10-CM

## 2021-01-02 DIAGNOSIS — E782 Mixed hyperlipidemia: Secondary | ICD-10-CM

## 2021-01-02 DIAGNOSIS — R0681 Apnea, not elsewhere classified: Secondary | ICD-10-CM

## 2021-01-02 DIAGNOSIS — N401 Enlarged prostate with lower urinary tract symptoms: Secondary | ICD-10-CM | POA: Diagnosis not present

## 2021-01-02 DIAGNOSIS — R0683 Snoring: Secondary | ICD-10-CM

## 2021-01-02 DIAGNOSIS — R351 Nocturia: Secondary | ICD-10-CM

## 2021-01-02 DIAGNOSIS — R400 Somnolence: Secondary | ICD-10-CM

## 2021-01-02 MED ORDER — TESTOSTERONE CYPIONATE 200 MG/ML IM SOLN
200.0000 mg | Freq: Once | INTRAMUSCULAR | Status: AC
  Administered 2021-01-02: 200 mg via INTRAMUSCULAR

## 2021-01-02 MED ORDER — TAMSULOSIN HCL 0.4 MG PO CAPS: 0.8000 mg | ORAL_CAPSULE | Freq: Every day | ORAL | 3 refills | Status: DC

## 2021-01-02 NOTE — Progress Notes (Signed)
Subjective:  Patient ID: Mark Mejia, male    DOB: [birth year]  Age: 65 y.o. MRN: [id number]  Chief Complaint  Patient presents with  . Hyperlipidemia  . Gastroesophageal Reflux    HPI Pt Is an African-American male who presents for follow-up of his hypertensive heart and chronic kidney disease, coronary artery disease, peripheral artery disease, GERD, testicular hypofunction, apnea, vitamin D deficiency.  Hypertension: His blood pressures well controlled 108/6070 today.  Patient is currently on amlodipine 5 mg once daily.  Takes aspirin 81 mg once daily.  Furosemide 20 mg once daily.  Has nitroglycerin as needed.  Patient eats healthy.  Some exercise.  Hyperlipidemia: with aortic atherosclerosis noted on recent xray.  Currently on Crestor for 20 mg once daily.  GERD: Omeprazole 40 mg once daily.  Hypothyroidism: Currently on Synthroid 75 mcg once daily.  BPH: Started Flomax 0.4 mg once daily.  Pt nocturia dropped from 8 times per night to 3 times per night  Vitamin D deficiency takes vitamin D 50,000 once weekly.  Lumbar back pain: xray.Tylenol helps. PT completed and helped.   Current Outpatient Medications on File Prior to Visit  Medication Sig Dispense Refill  . amLODipine (NORVASC) 5 MG tablet TAKE 1 TABLET BY MOUTH DAILY 90 tablet 0  . aspirin EC 81 MG tablet Take 81 mg by mouth daily.     . furosemide (LASIX) 20 MG tablet Take 20 mg by mouth daily.    . Glycerin-Hypromellose-PEG 400 ([facility] DRY EYE RELIEF OP) Place 2 drops into both eyes daily as needed (for dry eyes).    Marland Kitchen HYDROcodone-acetaminophen (NORCO/VICODIN) 5-325 MG tablet Take 1 tablet by mouth every 6 (six) hours as needed for moderate pain. 30 tablet 0  . levothyroxine (SYNTHROID) 75 MCG tablet Take 75 mcg by mouth daily before breakfast.    . meloxicam (MOBIC) 7.5 MG tablet TAKE 1 TABLET (7.5 MG TOTAL) BY MOUTH 2 (TWO) TIMES DAILY. 180 tablet 1  . nitroGLYCERIN (NITROSTAT) 0.4 MG SL tablet Place 0.4 mg under the  tongue every 5 (five) minutes as needed for chest pain.     Marland Kitchen omeprazole (PRILOSEC) 40 MG capsule TAKE 1 CAPSULE BY MOUTH DAILY 90 capsule 1  . PREDNISOLONE PO Take 50 mg by mouth as needed (Leg pain).    . rosuvastatin (CRESTOR) 20 MG tablet TAKE ONE TABLET BY MOUTH DAILY 90 tablet 1  . sildenafil (VIAGRA) 100 MG tablet Take 1 tablet (100 mg total) by mouth daily as needed for erectile dysfunction. Take 1/2 to 1 tablet 30-60 minutes prior to intercourse. 10 tablet 5  . testosterone cypionate (DEPOTESTOSTERONE CYPIONATE) 200 MG/ML injection INJECT 1ML INTO THE MUSCLE EVERY 14 DAYS 6 mL 1  . tiZANidine (ZANAFLEX) 4 MG tablet TAKE ONE TABLET BY MOUTH EVERY SIX (6) HOURS AS NEEDED FOR MUSCLE SPASMS 30 tablet 2  . Vitamin D, Ergocalciferol, (DRISDOL) 1.25 MG (50000 UNIT) CAPS capsule TAKE 1 CAPSULE (50,000 UNITS TOTAL) BY MOUTH EVERY SUNDAY. 12 capsule 1   No current facility-administered medications on file prior to visit.   Past Medical History:  Diagnosis Date  . Angina pectoris ([place]) 05/25/2017  . Atrophy of thyroid   . Benign prostatic hyperplasia with nocturia 06/14/2019  . CAD (coronary artery disease)   . 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 Chest pain of uncertain etiology 2/59/5638  . Encounter for adjustment and management of vascular access device 03/10/2016  . Encounter for annual wellness exam in Medicare patient 03/10/2016  .  GERD (gastroesophageal reflux disease)   . Hepatitis C   . History of esophageal cancer   . 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 Hypertrophic scar of skin 09/29/2016  . 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  . Mixed hyperlipidemia   . Peripheral vascular disease, asymptomatic ([place]) 05/23/2017   80-99% stenosis in the left internal coronary artery  . Precordial chest pain 12/26/2015  . Shortness of breath 12/26/2015  . Testicular hypofunction   . Vitamin D  deficiency    Past Surgical History:  Procedure Laterality Date  . GASTROSTOMY W/ FEEDING TUBE    . LEFT HEART CATH AND CORONARY ANGIOGRAPHY N/A 05/25/2017   Procedure: LEFT HEART CATH AND CORONARY ANGIOGRAPHY;  Surgeon: Martinique, Peter M, MD;  Location: [place];  Service: Cardiovascular;  Laterality: N/A;  . PORTACATH PLACEMENT    . THROAT SURGERY      Family History  Problem Relation Age of Onset  . Arthritis Mother   . Hypertension Mother   . Cancer - Other Maternal Grandmother    Social History   Socioeconomic History  . Marital status: Married    Spouse name: Not on file  . Number of children: Not on file  . Years of education: Not on file  . Highest education level: Not on file  Occupational History  . Occupation: Retired  Tobacco Use  . Smoking status: Never  . Smokeless tobacco: Never  Vaping Use  . Vaping Use: Never used  Substance and Sexual Activity  . Alcohol use: No  . Drug use: No  . Sexual activity: Not on file  Other Topics Concern  . Not on file  Social History Narrative  . Not on file   Social Determinants of Health   Financial Resource Strain: Low Risk   . Difficulty of Paying Living Expenses: Not hard at all  Food Insecurity: Not on file  Transportation Needs: Not on file  Physical Activity: Not on file  Stress: Not on file  Social Connections: Not on file    Review of Systems  Constitutional:  Positive for fatigue. Negative for chills and fever.  HENT:  Negative for congestion, rhinorrhea and sore throat.   Respiratory:  Positive for cough and shortness of breath.   Cardiovascular:  Negative for chest pain and palpitations.  Gastrointestinal:  Negative for abdominal pain, constipation, diarrhea, nausea and vomiting.  Genitourinary:  Negative for dysuria and urgency.  Musculoskeletal:  Negative for arthralgias, back pain and myalgias.  Neurological:  Negative for dizziness and headaches.  Psychiatric/Behavioral:  Negative for  dysphoric mood. The patient is not nervous/anxious.     Objective:  BP 108/70   Pulse 60   Temp (!) 96.3 F (35.7 C)   Resp 16   Ht 5\' 11"  (1.803 m)   Wt 215 lb (97.5 kg)   BMI 29.99 kg/m   BP/Weight 01/02/2021 11/26/2020 8/88/2800  Systolic BP 349 179 150  Diastolic BP 70 84 81  Wt. (Lbs) 215 212 206.9  BMI 29.99 29.57 29.69    Physical Exam Vitals reviewed.  Constitutional:      Appearance: Normal appearance.  Neck:     Vascular: No carotid bruit.  Cardiovascular:     Rate and Rhythm: Normal rate and regular rhythm.     Pulses: Normal pulses.     Heart sounds: Normal heart sounds.  Pulmonary:     Effort: Pulmonary effort is normal.  Breath sounds: Normal breath sounds. No wheezing, rhonchi or rales.  Abdominal:     General: Bowel sounds are normal.     Palpations: Abdomen is soft.     Tenderness: There is no abdominal tenderness.  Neurological:     Mental Status: He is alert and oriented to person, place, and time.  Psychiatric:        Mood and Affect: Mood normal.        Behavior: Behavior normal.    Diabetic Foot Exam - Simple   No data filed      Lab Results  Component Value Date   WBC 4.2 11/26/2020   HGB 14.1 11/26/2020   HCT 44.1 11/26/2020   PLT 146 (L) 11/26/2020   GLUCOSE 84 11/26/2020   CHOL 121 07/03/2020   TRIG 146 07/03/2020   HDL 30 (L) 07/03/2020   LDLCALC 65 07/03/2020   ALT 24 11/26/2020   AST 31 11/26/2020   NA 142 11/26/2020   K 4.4 11/26/2020   CL 103 11/26/2020   CREATININE 1.07 11/26/2020   BUN 12 11/26/2020   CO2 26 11/26/2020   TSH 4.390 11/26/2020   INR 1.1 05/23/2017      Assessment & Plan:   Problem List Items Addressed This Visit       Cardiovascular and Mediastinum   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 Continue amlodipine 5 mg once daily, furosemide 20 mg once daily.      Relevant  Orders   CBC with Differential/Platelet   Comprehensive metabolic panel   Aortic atherosclerosis (HCC)    Continue aspirin 81 mg once daily.  Continue crestor 20 mg once daily at night.  Check labs.       Atherosclerosis of native artery of both lower extremities with intermittent claudication [facility])     Endocrine   Atrophy of thyroid    The current medical regimen is effective;  continue present plan and medications. Continue on synthroid 75 mcg once in am.      Male hypogonadism    Continue current testosterone shots. Recommend take every 2 weeks. Recommend more consistency with taking shots.        Other   Mixed hyperlipidemia    The current medical regimen is effective;  continue present plan and medications.  Continue crestor 20 mg once daily at night.      Relevant Orders   Lipid panel   Benign prostatic hyperplasia with nocturia    Nocturia improved.  Recommend increase tamsulosin 0.4 mg two at night.       Relevant Medications   tamsulosin (FLOMAX) 0.4 MG CAPS capsule   Apnea    Pt has not had his sleep study yet. I will discuss with referral coordinator.      .  Meds ordered this encounter  Medications  . tamsulosin (FLOMAX) 0.4 MG CAPS capsule    Sig: Take 2 capsules (0.8 mg total) by mouth daily.    Dispense:  30 capsule    Refill:  3  . testosterone cypionate (DEPOTESTOSTERONE CYPIONATE) injection 200 mg    Orders Placed This Encounter  Procedures  . CBC with Differential/Platelet  . Comprehensive metabolic panel  . Lipid panel     Follow-up: No follow-ups on file.  An After Visit Summary was printed and given to the patient.  Rochel Brome, MD [facility] [phone]

## 2021-01-02 NOTE — Assessment & Plan Note (Signed)
The current medical regimen is effective;  continue present plan and medications. Continue on synthroid 75 mcg once in am.

## 2021-01-02 NOTE — Assessment & Plan Note (Signed)
>>  ASSESSMENT AND PLAN FOR AORTIC ATHEROSCLEROSIS (HCC) WRITTEN ON 01/02/2021  7:33 PM BY [facility], KIRSTEN, MD  Continue aspirin 81 mg once daily.  Continue crestor 20 mg once daily at night.  Check labs.

## 2021-01-02 NOTE — Assessment & Plan Note (Signed)
Continue aspirin 81 mg once daily.  Continue crestor 20 mg once daily at night.  Check labs.

## 2021-01-02 NOTE — Assessment & Plan Note (Signed)
Continue current testosterone shots. Recommend take every 2 weeks. Recommend more consistency with taking shots.

## 2021-01-02 NOTE — Assessment & Plan Note (Signed)
The current medical regimen is effective;  continue present plan and medications. Continue amlodipine 5 mg once daily, furosemide 20 mg once daily.

## 2021-01-02 NOTE — Assessment & Plan Note (Signed)
Nocturia improved.  Recommend increase tamsulosin 0.4 mg two at night.

## 2021-01-02 NOTE — Patient Instructions (Signed)
Increase tamsulosin 0.4 mg 2 at night.

## 2021-01-02 NOTE — Assessment & Plan Note (Signed)
The current medical regimen is effective;  continue present plan and medications.  Continue crestor 20 mg once daily at night.

## 2021-01-02 NOTE — Assessment & Plan Note (Signed)
Pt has not had his sleep study yet. I will discuss with referral coordinator.

## 2021-01-03 LAB — COMPREHENSIVE METABOLIC PANEL
ALT: 25 IU/L (ref 0–44)
AST: 32 IU/L (ref 0–40)
Albumin/Globulin Ratio: 1.7 (ref 1.2–2.2)
Albumin: 4.6 g/dL (ref 3.8–4.8)
Alkaline Phosphatase: 54 IU/L (ref 44–121)
BUN/Creatinine Ratio: 8 — ABNORMAL LOW (ref 10–24)
BUN: 9 mg/dL (ref 8–27)
Bilirubin Total: 0.7 mg/dL (ref 0.0–1.2)
CO2: 25 mmol/L (ref 20–29)
Calcium: 9.4 mg/dL (ref 8.6–10.2)
Chloride: 102 mmol/L (ref 96–106)
Creatinine, Ser: 1.16 mg/dL (ref 0.76–1.27)
Globulin, Total: 2.7 g/dL (ref 1.5–4.5)
Glucose: 87 mg/dL (ref 70–99)
Potassium: 4.1 mmol/L (ref 3.5–5.2)
Sodium: 139 mmol/L (ref 134–144)
Total Protein: 7.3 g/dL (ref 6.0–8.5)
eGFR: 69 mL/min/{1.73_m2} (ref >59)

## 2021-01-03 LAB — CBC WITH DIFFERENTIAL/PLATELET
Basophils Absolute: 0 10*3/uL (ref 0.0–0.2)
Basos: 1 %
EOS (ABSOLUTE): 0.2 10*3/uL (ref 0.0–0.4)
Eos: 4 %
Hematocrit: 41 % (ref 37.5–51.0)
Hemoglobin: 13.8 g/dL (ref 13.0–17.7)
Immature Grans (Abs): 0 10*3/uL (ref 0.0–0.1)
Immature Granulocytes: 0 %
Lymphocytes Absolute: 2 10*3/uL (ref 0.7–3.1)
Lymphs: 51 %
MCH: 26.6 pg (ref 26.6–33.0)
MCHC: 33.7 g/dL (ref 31.5–35.7)
MCV: 79 fL (ref 79–97)
Monocytes Absolute: 0.3 10*3/uL (ref 0.1–0.9)
Monocytes: 7 %
Neutrophils Absolute: 1.5 10*3/uL (ref 1.4–7.0)
Neutrophils: 37 %
Platelets: 148 10*3/uL — ABNORMAL LOW (ref 150–450)
RBC: 5.18 x10E6/uL (ref 4.14–5.80)
RDW: 15.6 % — ABNORMAL HIGH (ref 11.6–15.4)
WBC: 3.9 10*3/uL (ref 3.4–10.8)

## 2021-01-03 LAB — LIPID PANEL
Chol/HDL Ratio: 3.9 ratio (ref 0.0–5.0)
Cholesterol, Total: 94 mg/dL — ABNORMAL LOW (ref 100–199)
HDL: 24 mg/dL — ABNORMAL LOW (ref >39)
LDL Chol Calc (NIH): 52 mg/dL (ref 0–99)
Triglycerides: 89 mg/dL (ref 0–149)
VLDL Cholesterol Cal: 18 mg/dL (ref 5–40)

## 2021-01-03 LAB — CARDIOVASCULAR RISK ASSESSMENT

## 2021-01-03 NOTE — Progress Notes (Signed)
Blood count normal.  Liver function normal.  Kidney function normal.  Cholesterol: good  

## 2021-01-05 ENCOUNTER — Telehealth: Payer: Self-pay | Admitting: Family Medicine

## 2021-01-05 NOTE — Telephone Encounter (Signed)
   Mark Mejia has been scheduled for the following appointment:  WHAT: VASCULAR ULTRASOUND  WHERE: [facility] DATE: 01/07/21 TIME: 9:30 AM ARRIVAL TIME  Patient has been made aware. SPOKE TO PT'S WIFE

## 2021-01-07 DIAGNOSIS — M79672 Pain in left foot: Secondary | ICD-10-CM | POA: Diagnosis not present

## 2021-01-07 DIAGNOSIS — I70213 Atherosclerosis of native arteries of extremities with intermittent claudication, bilateral legs: Secondary | ICD-10-CM | POA: Diagnosis not present

## 2021-01-07 DIAGNOSIS — M79662 Pain in left lower leg: Secondary | ICD-10-CM | POA: Diagnosis not present

## 2021-01-07 DIAGNOSIS — M79671 Pain in right foot: Secondary | ICD-10-CM | POA: Diagnosis not present

## 2021-01-07 DIAGNOSIS — M79661 Pain in right lower leg: Secondary | ICD-10-CM | POA: Diagnosis not present

## 2021-01-08 ENCOUNTER — Telehealth: Payer: Medicare PPO

## 2021-01-15 ENCOUNTER — Other Ambulatory Visit: Payer: Self-pay

## 2021-01-15 DIAGNOSIS — I70213 Atherosclerosis of native arteries of extremities with intermittent claudication, bilateral legs: Secondary | ICD-10-CM

## 2021-01-16 ENCOUNTER — Other Ambulatory Visit: Payer: Self-pay | Admitting: Physician Assistant

## 2021-01-27 ENCOUNTER — Telehealth: Payer: Self-pay

## 2021-01-27 ENCOUNTER — Telehealth: Payer: Medicare PPO

## 2021-01-27 NOTE — Telephone Encounter (Signed)
  Care Management   Follow Up Note   01/27/2021 Name: TARL CEPHAS MRN: [id number] DOB: Mar 16, 1955   Referred by: Rochel Brome, MD Reason for referral : Chronic Care Management   Successful call to patient on his cell phone. Reports that he forgot about appointment and does not have time to talk.  Request reschedule.  Follow Up Plan: The care management team will reach out to the patient again over the next 7 days.  Tomasa Rand RN, BSN, CEN RN Case Freight forwarder - [facility] [employer] exhibitions officer Mobile: [phone]

## 2021-01-29 ENCOUNTER — Other Ambulatory Visit: Payer: Self-pay

## 2021-01-29 ENCOUNTER — Ambulatory Visit (HOSPITAL_BASED_OUTPATIENT_CLINIC_OR_DEPARTMENT_OTHER): Payer: Medicare PPO | Attending: Family Medicine | Admitting: Internal Medicine

## 2021-01-29 DIAGNOSIS — G479 Sleep disorder, unspecified: Secondary | ICD-10-CM | POA: Insufficient documentation

## 2021-01-29 DIAGNOSIS — R0683 Snoring: Secondary | ICD-10-CM | POA: Insufficient documentation

## 2021-01-29 DIAGNOSIS — R400 Somnolence: Secondary | ICD-10-CM | POA: Diagnosis not present

## 2021-01-29 DIAGNOSIS — R0681 Apnea, not elsewhere classified: Secondary | ICD-10-CM | POA: Insufficient documentation

## 2021-02-01 DIAGNOSIS — R0681 Apnea, not elsewhere classified: Secondary | ICD-10-CM

## 2021-02-01 DIAGNOSIS — R0683 Snoring: Secondary | ICD-10-CM

## 2021-02-01 NOTE — Procedures (Signed)
    Patient Name: Mark Mejia, Mark Mejia Date: 01/29/2021 Gender: Male D.O.B: 28-Sep-1955 Age (years): 65 Referring Provider: Dirk Dress Height (inches): 70 Interpreting Physician: Baird Lyons MD, ABSM Weight (lbs): 220 RPSGT: Zadie Rhine BMI: 32 MRN: [id number] Neck Size: 15.00  CLINICAL INFORMATION Sleep Study Type: NPSG Indication for sleep study: Non-refreshing Sleep, Snoring Epworth Sleepiness Score: 12  SLEEP STUDY TECHNIQUE As per the AASM Manual for the Scoring of Sleep and Associated Events v2.3 (April 2016) with a hypopnea requiring 4% desaturations.  The channels recorded and monitored were frontal, central and occipital EEG, electrooculogram (EOG), submentalis EMG (chin), nasal and oral airflow, thoracic and abdominal wall motion, anterior tibialis EMG, snore microphone, electrocardiogram, and pulse oximetry.  MEDICATIONS Medications self-administered by patient taken the night of the study : none reported  SLEEP ARCHITECTURE The study was initiated at 10:01:14 PM and ended at 4:53:47 AM.  Sleep onset time was 33.2 minutes and the sleep efficiency was 62.5%%. The total sleep time was 257.9 minutes.  Stage REM latency was 127.5 minutes.  The patient spent 10.7%% of the night in stage N1 sleep, 47.9%% in stage N2 sleep, 28.5%% in stage N3 and 13% in REM.  Alpha intrusion was absent.  Supine sleep was 43.58%.  RESPIRATORY PARAMETERS The overall apnea/hypopnea index (AHI) was 0.5 per hour. There were 0 total apneas, including 0 obstructive, 0 central and 0 mixed apneas. There were 2 hypopneas and 0 RERAs.  The AHI during Stage REM sleep was 3.6 per hour.  AHI while supine was 1.1 per hour.  The mean oxygen saturation was 94.7%. The minimum SpO2 during sleep was 88.0%. Time with O2 saturation 88% or less was 0.1 minute.  moderate snoring was noted during this study.  CARDIAC DATA The 2 lead EKG demonstrated sinus rhythm. The mean heart rate was 61.0 beats  per minute. Other EKG findings include: None.  LEG MOVEMENT DATA The total PLMS were 0 with a resulting PLMS index of 0.0. Associated arousal with leg movement index was 0.0 .  IMPRESSIONS - No significant obstructive sleep apnea occurred during this study (AHI = 0.5/h). - The patient had minimal or no oxygen desaturation during the study (Min O2 = 88.0%) - The patient snored with moderate snoring volume. - No cardiac abnormalities were noted during this study. - Clinically significant periodic limb movements did not occur during sleep. No significant associated arousals. - Sleep was fragmented with many brief, nonspecific awakenings. Bathroom x 2.  DIAGNOSIS - Other Sleep Disorder  RECOMMENDATIONS - Consider if assessment for nocturia and for insomnia would be helpful. - Sleep hygiene should be reviewed to assess factors that may improve sleep quality. - Weight management and regular exercise should be initiated or continued if appropriate.  [Electronically signed] 02/01/2021 12:06 PM  Baird Lyons MD, [place], American Board of Sleep Medicine   NPI: [id number]                         [place] [place] [facility]  ELECTRONICALLY SIGNED ON:  02/01/2021, 11:57 AM [place] PH: [phone]   FX: [phone] [place]

## 2021-02-05 ENCOUNTER — Telehealth: Payer: Self-pay

## 2021-02-05 NOTE — Chronic Care Management (AMB) (Signed)
Chronic Care Management Pharmacy Assistant   Name: Mark Mejia  MRN: [id number] DOB: [birth year]   Reason for Encounter: Disease State/ Hypertension   Recent office visits:  01-02-2021 Rochel Brome, MD. INCREASE Flomax 0.4 mg daily TO 0.8 mg daily. RDW=  15.6, Platelets= 148, BUN/Creatinine= 8. Cholesterol= 94, HDL= 24. US arterial lower extremity Duplex bilateral. Testosterone injection given.   Recent consult visits:  01-29-2021 Baird Lyons MD (Sleep disorder). Sleep study performed.  Hospital visits:  None in previous 6 months  Medications: Outpatient Encounter Medications as of 02/05/2021  Medication Sig   amLODipine (NORVASC) 5 MG tablet TAKE 1 TABLET BY MOUTH DAILY   aspirin EC 81 MG tablet Take 81 mg by mouth daily.    furosemide (LASIX) 20 MG tablet Take 20 mg by mouth daily.   Glycerin-Hypromellose-PEG 400 ([facility] DRY EYE RELIEF OP) Place 2 drops into both eyes daily as needed (for dry eyes).   HYDROcodone-acetaminophen (NORCO/VICODIN) 5-325 MG tablet Take 1 tablet by mouth every 6 (six) hours as needed for moderate pain.   levothyroxine (SYNTHROID) 75 MCG tablet Take 75 mcg by mouth daily before breakfast.   meloxicam (MOBIC) 7.5 MG tablet TAKE 1 TABLET (7.5 MG TOTAL) BY MOUTH 2 (TWO) TIMES DAILY.   nitroGLYCERIN (NITROSTAT) 0.4 MG SL tablet Place 0.4 mg under the tongue every 5 (five) minutes as needed for chest pain.    omeprazole (PRILOSEC) 40 MG capsule TAKE 1 CAPSULE BY MOUTH DAILY   PREDNISOLONE PO Take 50 mg by mouth as needed (Leg pain).   rosuvastatin (CRESTOR) 20 MG tablet TAKE ONE TABLET BY MOUTH DAILY   sildenafil (VIAGRA) 100 MG tablet Take 1 tablet (100 mg total) by mouth daily as needed for erectile dysfunction. Take 1/2 to 1 tablet 30-60 minutes prior to intercourse.   tamsulosin (FLOMAX) 0.4 MG CAPS capsule Take 2 capsules (0.8 mg total) by mouth daily.   testosterone cypionate (DEPOTESTOSTERONE CYPIONATE) 200 MG/ML injection INJECT 1ML INTO THE MUSCLE  EVERY 14 DAYS   tiZANidine (ZANAFLEX) 4 MG tablet TAKE ONE TABLET BY MOUTH EVERY SIX (6) HOURS AS NEEDED FOR MUSCLE SPASMS   Vitamin D, Ergocalciferol, (DRISDOL) 1.25 MG (50000 UNIT) CAPS capsule TAKE 1 CAPSULE (50,000 UNITS TOTAL) BY MOUTH EVERY SUNDAY.   No facility-administered encounter medications on file as of 02/05/2021.    Recent Office Vitals: BP Readings from Last 3 Encounters:  01/02/21 108/70  11/26/20 136/84  07/23/20 (!) 145/81   Pulse Readings from Last 3 Encounters:  01/02/21 60  11/26/20 61  07/23/20 (!) 59    Wt Readings from Last 3 Encounters:  01/29/21 220 lb (99.8 kg)  01/02/21 215 lb (97.5 kg)  11/26/20 212 lb (96.2 kg)     Kidney Function Lab Results  Component Value Date/Time   CREATININE 1.16 01/02/2021 09:56 AM   CREATININE 1.07 11/26/2020 12:07 PM   GFRNONAA 64 12/14/2019 10:50 AM   GFRAA 74 12/14/2019 10:50 AM    BMP Latest Ref Rng & Units 01/02/2021 11/26/2020 07/03/2020  Glucose 70 - 99 mg/dL 87 84 83  BUN 8 - 27 mg/dL 9 12 14   Creatinine 0.76 - 1.27 mg/dL 1.16 1.07 1.04  BUN/Creat Ratio 10 - 24 8(L) 11 13  Sodium 134 - 144 mmol/L 139 142 141  Potassium 3.5 - 5.2 mmol/L 4.1 4.4 4.1  Chloride 96 - 106 mmol/L 102 103 105  CO2 20 - 29 mmol/L 25 26 21   Calcium 8.6 - 10.2 mg/dL 9.4 9.8 9.9  Current antihypertensive regimen:  Amlodipine 5 mg daily Patient verbally confirms he is taking the above medications as directed. Yes  How often are you checking your Blood Pressure? weekly  he checks his blood pressure in the middle of the day after taking his medication.  Current home BP readings: 128/78 72P Wrist or arm cuff: cuff Caffeine intake: Limited  Salt intake: limited OTC medications including pseudoephedrine or NSAIDs? Patient stated tylenol  Any readings above 180/120? No  What recent interventions/DTPs have been made by any provider to improve Blood Pressure control since last CPP Visit:  Educated on BP goals and benefits of  medications for prevention of heart attack, stroke and kidney damage; -Counseled to monitor BP at home weekly, document, and provide log at future appointments -Recommended to continue current medication. Patient states he does have leg swelling, could be due to Amlodpine. Will keep an eye on it  Any recent hospitalizations or ED visits since last visit with CPP? No  What diet changes have been made to improve Blood Pressure Control?  Patient states he has limited his salt intake, eats plenty of fruits/vegetables and drinks water daily.  What exercise is being done to improve your Blood Pressure Control?  Patient states he walks around some if the weather is good.  Adherence Review: Is the patient currently on ACE/ARB medication? No Does the patient have >5 day gap between last estimated fill dates? CPP to review  Care Gaps: Last annual wellness visit? None sent message to Rhae Hammock to schedule. PNA Vac overdue Shingrix overdue  Star Rating Drugs: Rosuvastatin 20 mg- Last filled 11-13-2020 90 DS  [place] Clinical Pharmacist Assistant [phone]

## 2021-02-06 ENCOUNTER — Ambulatory Visit: Payer: Self-pay

## 2021-02-06 DIAGNOSIS — E782 Mixed hyperlipidemia: Secondary | ICD-10-CM

## 2021-02-06 DIAGNOSIS — E034 Atrophy of thyroid (acquired): Secondary | ICD-10-CM

## 2021-02-06 DIAGNOSIS — I110 Hypertensive heart disease with heart failure: Secondary | ICD-10-CM

## 2021-02-06 DIAGNOSIS — I700 Atherosclerosis of aorta: Secondary | ICD-10-CM

## 2021-02-06 DIAGNOSIS — I70213 Atherosclerosis of native arteries of extremities with intermittent claudication, bilateral legs: Secondary | ICD-10-CM

## 2021-02-06 NOTE — Progress Notes (Signed)
Updated blood pressure in chart with patient-reported home BP:  BP Readings from Last 3 Encounters:  02/06/21 128/78  01/02/21 108/70  11/26/20 136/84    Care Plan : [name]  Updates made by Lane Hacker, [place] since 02/06/2021 12:00 AM     Problem: Pain, BPH, PVD   Priority: High  Onset Date: 12/25/2020     Goal: Disease State Management   Start Date: 12/25/2020  Expected End Date: 12/25/2021  Recent Progress: On track  Priority: High  Note:   Current Barriers:  Does not maintain contact with provider office Does not contact provider office for questions/concerns  Pharmacist Clinical Goal(s):  Patient will contact provider office for questions/concerns as evidenced notation of same in electronic health record through collaboration with PharmD and provider.   Interventions: 1:1 collaboration with Cox, Elnita Maxwell, MD regarding development and update of comprehensive plan of care as evidenced by provider attestation and co-signature Inter-disciplinary care team collaboration (see longitudinal plan of care) Comprehensive medication review performed; medication list updated in electronic medical record  Hypertension (BP goal <140/90) BP Readings from Last 3 Encounters:  02/06/21 128/78  01/02/21 108/70  11/26/20 136/84  -Controlled -Current treatment: Amlodipine 5mg  -Medications previously tried: N/A  -Current home readings: 137/78 but rarely checks -Current dietary habits: "Tries to eat healthy" -Current exercise habits: None due to pain -Denies hypotensive/hypertensive symptoms -Educated on BP goals and benefits of medications for prevention of heart attack, stroke and kidney damage; -Counseled to monitor BP at home weekly, document, and provide log at future appointments -Recommended to continue current medication. Patient states he does have leg swelling, could be due to Amlodpine. Will keep an eye on it December 2022: Updated care plan  CAD (LDL  goal < 100) The ASCVD Risk score (Arnett DK, et al., 2019) failed to calculate for the following reasons:   The valid total cholesterol range is 130 to 320 mg/dL Lab Results  Component Value Date   CHOL 94 (L) 01/02/2021   CHOL 121 07/03/2020   CHOL 105 12/14/2019   Lab Results  Component Value Date   HDL 24 (L) 01/02/2021   HDL 30 (L) 07/03/2020   HDL 28 (L) 12/14/2019   Lab Results  Component Value Date   LDLCALC 52 01/02/2021   LDLCALC 65 07/03/2020   LDLCALC 62 12/14/2019   Lab Results  Component Value Date   TRIG 89 01/02/2021   TRIG 146 07/03/2020   TRIG 68 12/14/2019   Lab Results  Component Value Date   CHOLHDL 3.9 01/02/2021   CHOLHDL 4.0 07/03/2020   CHOLHDL 3.8 12/14/2019  No results found for: LDLDIRECT  -Controlled -Current treatment: ASA 81mg  Rosuvastatin 20mg  -Medications previously tried: None  -Current dietary habits: "Tries to eat healthy" -Current exercise habits: None due to pain -Educated on Cholesterol goals;  October 2022: Counseled to take ASA separate from Meloxicam, will take ASA PM and meloxicam first thing in AM  BPH (Goal: improve urination) -Not ideally controlled -Night time urination frequency: 2-3x/night but 8x/night without meds -Current treatment: Tamsulosin -Medications previously tried: N/A October 2022: Counsled on Kegels as supplemental therapy  Chronic Pain -Controlled -Current treatment: Meloxicam 15mg  QD APAP  Hydrocodone/APAP (Takes 1/6 months) Tizanidine 4mg  Prednisolone 50mg  PRN (This helps the best but he knows it's not good for him and takes rarely) -Medications previously tried: NSAID's  -Pain Scale If pain is 3-4/10, will take APAP and bring to 1-2/10. If 8-9/10, will take Tizanidine and bring to 1-2/10.  Aggravating Factors: Lack of sleep, movement Pain Type: musculo-skeletal  -Recommended to continue current medication    Patient Goals/Self-Care Activities Patient will:  - take medications as  prescribed  Follow Up Plan: The patient has been provided with contact information for the care management team and has been advised to call with any health related questions or concerns.   Mark Mejia, Pharm.D. - [phone]  CPP F/U Feb 2023      Lane Hacker, [facility]

## 2021-02-09 ENCOUNTER — Telehealth: Payer: Self-pay

## 2021-02-09 NOTE — Telephone Encounter (Signed)
LM requesting call back to schedule AWV.

## 2021-02-11 NOTE — Progress Notes (Signed)
Left message for patient to call office back.

## 2021-02-12 NOTE — Progress Notes (Signed)
Made pt aware. He VU.   Royce Macadamia, [place] 02/12/21 9:56 AM

## 2021-02-16 ENCOUNTER — Other Ambulatory Visit: Payer: Self-pay | Admitting: Cardiology

## 2021-02-17 ENCOUNTER — Ambulatory Visit (INDEPENDENT_AMBULATORY_CARE_PROVIDER_SITE_OTHER): Payer: Medicare PPO

## 2021-02-17 VITALS — Ht 71.0 in | Wt 205.0 lb

## 2021-02-17 DIAGNOSIS — I110 Hypertensive heart disease with heart failure: Secondary | ICD-10-CM

## 2021-02-17 DIAGNOSIS — E782 Mixed hyperlipidemia: Secondary | ICD-10-CM

## 2021-02-17 DIAGNOSIS — I5083 High output heart failure: Secondary | ICD-10-CM

## 2021-02-17 DIAGNOSIS — M545 Low back pain, unspecified: Secondary | ICD-10-CM

## 2021-02-17 NOTE — Chronic Care Management (AMB) (Signed)
Chronic Care Management   CCM RN Visit Note  02/17/2021 Name: Mark Mejia MRN: [id number] DOB: May 30, 1955  Subjective: Mark Mejia is a 65 y.o. year old male who is a primary care patient of Cox, Kirsten, MD. The care management team was consulted for assistance with disease management and care coordination needs.    Engaged with patient by telephone for initial visit in response to provider referral for case management and/or care coordination services.   Consent to Services:  The patient was given the following information about Chronic Care Management services today, agreed to services, and gave verbal consent: 1. CCM service includes personalized support from designated clinical staff supervised by the primary care provider, including individualized plan of care and coordination with other care providers 2. 24/7 contact phone numbers for assistance for urgent and routine care needs. 3. Service will only be billed when office clinical staff spend 20 minutes or more in a month to coordinate care. 4. Only one practitioner may furnish and bill the service in a calendar month. 5.The patient may stop CCM services at any time (effective at the end of the month) by phone call to the office staff. 6. The patient will be responsible for cost sharing (co-pay) of up to 20% of the service fee (after annual deductible is met). Patient agreed to services and consent obtained.  Patient agreed to services and verbal consent obtained.   Assessment: Review of patient past medical history, allergies, medications, health status, including review of consultants reports, laboratory and other test data, was performed as part of comprehensive evaluation and provision of chronic care management services.   SDOH (Social Determinants of Health) assessments and interventions performed:  SDOH Interventions    Flowsheet Row Most Recent Value  SDOH Interventions   Food Insecurity Interventions Intervention Not  Indicated  Housing Interventions Intervention Not Indicated  Intimate Partner Violence Interventions Intervention Not Indicated  Transportation Interventions Intervention Not Indicated        CCM Care Plan  No Known Allergies  Outpatient Encounter Medications as of 02/17/2021  Medication Sig   amLODipine (NORVASC) 5 MG tablet TAKE 1 TABLET BY MOUTH DAILY   aspirin EC 81 MG tablet Take 81 mg by mouth daily.    dorzolamide (TRUSOPT) 2 % ophthalmic solution Place 1 drop into the right eye 2 (two) times daily.   HYDROcodone-acetaminophen (NORCO/VICODIN) 5-325 MG tablet Take 1 tablet by mouth every 6 (six) hours as needed for moderate pain.   latanoprost (XALATAN) 0.005 % ophthalmic solution Place 1 drop into the right eye at bedtime.   levothyroxine (SYNTHROID) 75 MCG tablet Take 75 mcg by mouth daily before breakfast.   meloxicam (MOBIC) 7.5 MG tablet TAKE 1 TABLET (7.5 MG TOTAL) BY MOUTH 2 (TWO) TIMES DAILY.   omeprazole (PRILOSEC) 40 MG capsule TAKE 1 CAPSULE BY MOUTH DAILY   rosuvastatin (CRESTOR) 20 MG tablet TAKE ONE TABLET BY MOUTH DAILY   sildenafil (VIAGRA) 100 MG tablet Take 1 tablet (100 mg total) by mouth daily as needed for erectile dysfunction. Take 1/2 to 1 tablet 30-60 minutes prior to intercourse.   tamsulosin (FLOMAX) 0.4 MG CAPS capsule Take 2 capsules (0.8 mg total) by mouth daily.   testosterone cypionate (DEPOTESTOSTERONE CYPIONATE) 200 MG/ML injection INJECT 1ML INTO THE MUSCLE EVERY 14 DAYS   tiZANidine (ZANAFLEX) 4 MG tablet TAKE ONE TABLET BY MOUTH EVERY SIX (6) HOURS AS NEEDED FOR MUSCLE SPASMS   Vitamin D, Ergocalciferol, (DRISDOL) 1.25 MG (50000 UNIT) CAPS capsule TAKE  1 CAPSULE (50,000 UNITS TOTAL) BY MOUTH EVERY SUNDAY.   furosemide (LASIX) 20 MG tablet Take 20 mg by mouth daily. (Patient not taking: Reported on 02/17/2021)   Glycerin-Hypromellose-PEG 400 ([facility] DRY EYE RELIEF OP) Place 2 drops into both eyes daily as needed (for dry eyes). (Patient not taking:  Reported on 02/17/2021)   nitroGLYCERIN (NITROSTAT) 0.4 MG SL tablet Place 0.4 mg under the tongue every 5 (five) minutes as needed for chest pain.  (Patient not taking: Reported on 02/17/2021)   PREDNISOLONE PO Take 50 mg by mouth as needed (Leg pain). (Patient not taking: Reported on 02/17/2021)   No facility-administered encounter medications on file as of 02/17/2021.    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 ([place]) 01/02/2021   Aortic atherosclerosis ([place]) 01/02/2021   Atherosclerosis of native artery of both lower extremities with intermittent claudication ([place]) 01/02/2021   Apnea 11/26/2020   Snoring 11/26/2020   Somnolence, daytime 11/26/2020   Lumbar back pain 11/26/2020   Other fatigue 11/26/2020   Need for immunization against influenza 11/26/2020   Overweight (BMI 25.0-29.9) 11/26/2020   CAD (coronary artery disease)    GERD (gastroesophageal reflux disease)    Hepatitis C    History of esophageal cancer    Testicular hypofunction    Vitamin D deficiency    Mixed hyperlipidemia 06/14/2019   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/2019   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   Benign prostatic hyperplasia with nocturia 06/14/2019   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[place]) 06/14/2019   Chest pain of uncertain etiology 62/83/6629   Angina pectoris ([place]) 05/25/2017   Peripheral vascular disease, asymptomatic ([place]) 05/23/2017   Hypertrophic scar of skin 09/29/2016   Encounter for annual wellness exam in Medicare patient 03/10/2016   Encounter for adjustment and management of vascular access device 03/10/2016   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   Precordial chest pain 12/26/2015   Dyspnea on exertion 12/26/2015    Conditions to be addressed/monitored:CHF, HTN, HLD, and Chronic pain  Care Plan : RN care manager plan of care  Updates made by Thana Ates, RN since 02/17/2021  12:00 AM     Problem: No plan of care established for management of chronic disease states ( chronic pain, CHF, Hypertension, HLD)   Priority: High     Long-Range Goal: Development of plan of care for chronic disease management (Chronic pain, CHF, hypertension, HLD)   Start Date: 02/17/2021  Expected End Date: 02/17/2022  Priority: High  Note:   Current Barriers:  Chronic Disease Management support and education needs related to CHF, HTN, HLD, and chronic pain No advanced directives. Reports he has an advanced directive but it needs to be updated.  02/17/2021 Patient reports that he is taking his medications as prescribed. Reports he is considering changing back to the [facility] mail in pharmacy. Patient reports that he is not taking his Lasix because he thought he made him swell. Reports that he is doing to restart this medication. Reports that he is not monitoring his weight or blood pressure. Patient reports chronic pain and take medications. Reports that he is not able to take the muscle relaxer except at night because it makes him sleepy.  Reports he is cancer free. 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 This was stretched 13 months ago.   RNCM Clinical Goal(s):  Patient will verbalize understanding  of plan for management of CHF, HTN, HLD, and Chronic pain as evidenced by self reporting of improve health take all medications exactly as prescribed and will call provider for medication related questions as evidenced by patient report    attend all scheduled medical appointments: including PCP and specialist as evidenced by patient report.        collaborate with the care management team towards completion of advanced directives by patient as evidenced by verbalizing completion  through collaboration with RN Care manager, provider, and care team.   Interventions: 1:1 collaboration with primary care provider regarding development and update of  comprehensive plan of care as evidenced by provider attestation and co-signature Inter-disciplinary care team collaboration (see longitudinal plan of care) Evaluation of current treatment plan related to  self management and patient's adherence to plan as established by provider   Heart Failure Interventions:  (Status: New goal.)  Long Term Goal  Provided education on low sodium diet Reviewed Heart Failure Action Plan in depth and provided written copy Assessed need for readable accurate scales in home Provided education about placing scale on hard, flat surface Advised patient to weigh each morning after emptying bladder Discussed importance of daily weight and advised patient to weigh and record daily Reviewed role of diuretics in prevention of fluid overload and management of heart failure Discussed the importance of keeping all appointments with provider Assessed social determinant of health barriers  Hyperlipidemia:  (Status: New goal.) Long Term Goal  Lab Results  Component Value Date   CHOL 94 (L) 01/02/2021   HDL 24 (L) 01/02/2021   LDLCALC 52 01/02/2021   TRIG 89 01/02/2021   CHOLHDL 3.9 01/02/2021     Medication review performed; medication list updated in electronic medical record.  Provider established cholesterol goals reviewed; Reviewed role and benefits of statin for ASCVD risk reduction;  Hypertension: (Status: New goal.) Long Term Goal Last practice recorded BP readings:  BP Readings from Last 3 Encounters:  02/06/21 128/78  01/02/21 108/70  11/26/20 136/84  Most recent eGFR/CrCl:  Lab Results  Component Value Date   EGFR 69 01/02/2021    No components found for: CRCL  Evaluation of current treatment plan related to hypertension self management and patient's adherence to plan as established by provider;   Reviewed prescribed diet low salt diet. Reviewed using dash Reviewed medications with patient and discussed importance of compliance;  Advised patient,  providing education and rationale, to monitor blood pressure daily and record, calling PCP for findings outside established parameters;  Reviewed scheduled/upcoming provider appointments including:   Pain:  (Status: New goal.) Long Term Goal  Pain assessment performed Medications reviewed Discussed importance of adherence to all scheduled medical appointments; Reviewed with patient prescribed pharmacological and nonpharmacological pain relief strategies;  Patient Goals/Self-Care Activities: Take medications as prescribed   Attend all scheduled provider appointments Call pharmacy for medication refills 3-7 days in advance of running out of medications call office if I gain more than 2 pounds in one day or 5 pounds in one week track weight in diary use salt in moderation watch for swelling in feet, ankles and legs every day weigh myself daily follow rescue plan if symptoms flare-up track symptoms and what helps feel better or worse check blood pressure daily choose a place to take my blood pressure (home, clinic or office, retail store) write blood pressure results in a log or diary keep a blood pressure log take blood pressure log to all doctor appointments call doctor for signs  and symptoms of high blood pressure develop an action plan for high blood pressure keep all doctor appointments take medications for blood pressure exactly as prescribed report new symptoms to your doctor        Plan:Telephone follow up appointment with care management team member scheduled for:  03/24/2021 at 1:30pm Tomasa Rand RN, BSN, CEN RN Case Manager - [facility] [place]: [phone]

## 2021-02-17 NOTE — Patient Instructions (Signed)
Visit Information   Thank you for taking time to visit with me today. Please don't hesitate to contact me if I can be of assistance to you before our next scheduled telephone appointment.  Following are the goals we discussed today:  Take medications as prescribed   Attend all scheduled provider appointments Call pharmacy for medication refills 3-7 days in advance of running out of medications call office if I gain more than 2 pounds in one day or 5 pounds in one week track weight in diary use salt in moderation watch for swelling in feet, ankles and legs every day weigh myself daily follow rescue plan if symptoms flare-up track symptoms and what helps feel better or worse check blood pressure daily choose a place to take my blood pressure (home, clinic or office, retail store) write blood pressure results in a log or diary keep a blood pressure log take blood pressure log to all doctor appointments call doctor for signs and symptoms of high blood pressure develop an action plan for high blood pressure keep all doctor appointments take medications for blood pressure exactly as prescribed report new symptoms to your doctor  Our next appointment is by telephone on 03/24/2021 at 1:30pm  Please call the care guide team at [phone] if you need to cancel or reschedule your appointment.   If you are experiencing a Mental Health or [place] or need someone to talk to, please call the Suicide and Crisis Lifeline: 988 call the [place] National Suicide Prevention Lifeline: 804-025-4714 or TTY: 918-823-3024 TTY 2560886736) to talk to a trained counselor call 1-800-273-TALK (toll free, 24 hour hotline) call 911   Following is a copy of your full care plan:  Care Plan : RN care manager plan of care  Updates made by Thana Ates, RN since 02/17/2021 12:00 AM     Problem: No plan of care established for management of chronic disease states ( chronic pain, CHF,  Hypertension, HLD)   Priority: High     Long-Range Goal: Development of plan of care for chronic disease management (Chronic pain, CHF, hypertension, HLD)   Start Date: 02/17/2021  Expected End Date: 02/17/2022  Priority: High  Note:   Current Barriers:  Chronic Disease Management support and education needs related to CHF, HTN, HLD, and chronic pain No advanced directives. Reports he has an advanced directive but it needs to be updated.  02/17/2021 Patient reports that he is taking his medications as prescribed. Reports he is considering changing back to the [facility] mail in pharmacy. Patient reports that he is not taking his Lasix because he thought he made him swell. Reports that he is doing to restart this medication. Reports that he is not monitoring his weight or blood pressure. Patient reports chronic pain and take medications. Reports that he is not able to take the muscle relaxer except at night because it makes him sleepy.  Reports he is cancer free. 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 This was stretched 13 months ago.   RNCM Clinical Goal(s):  Patient will verbalize understanding of plan for management of CHF, HTN, HLD, and Chronic pain as evidenced by self reporting of improve health take all medications exactly as prescribed and will call provider for medication related questions as evidenced by patient report    attend all scheduled medical appointments: including PCP and specialist as evidenced by patient report.        collaborate with  the care management team towards completion of advanced directives by patient as evidenced by verbalizing completion  through collaboration with RN Care manager, provider, and care team.   Interventions: 1:1 collaboration with primary care provider regarding development and update of comprehensive plan of care as evidenced by provider attestation and co-signature Inter-disciplinary care team  collaboration (see longitudinal plan of care) Evaluation of current treatment plan related to  self management and patient's adherence to plan as established by provider   Heart Failure Interventions:  (Status: New goal.)  Long Term Goal  Provided education on low sodium diet Reviewed Heart Failure Action Plan in depth and provided written copy Assessed need for readable accurate scales in home Provided education about placing scale on hard, flat surface Advised patient to weigh each morning after emptying bladder Discussed importance of daily weight and advised patient to weigh and record daily Reviewed role of diuretics in prevention of fluid overload and management of heart failure Discussed the importance of keeping all appointments with provider Assessed social determinant of health barriers  Hyperlipidemia:  (Status: New goal.) Long Term Goal  Lab Results  Component Value Date   CHOL 94 (L) 01/02/2021   HDL 24 (L) 01/02/2021   LDLCALC 52 01/02/2021   TRIG 89 01/02/2021   CHOLHDL 3.9 01/02/2021     Medication review performed; medication list updated in electronic medical record.  Provider established cholesterol goals reviewed; Reviewed role and benefits of statin for ASCVD risk reduction;  Hypertension: (Status: New goal.) Long Term Goal Last practice recorded BP readings:  BP Readings from Last 3 Encounters:  02/06/21 128/78  01/02/21 108/70  11/26/20 136/84  Most recent eGFR/CrCl:  Lab Results  Component Value Date   EGFR 69 01/02/2021    No components found for: CRCL  Evaluation of current treatment plan related to hypertension self management and patient's adherence to plan as established by provider;   Reviewed prescribed diet low salt diet. Reviewed using dash Reviewed medications with patient and discussed importance of compliance;  Advised patient, providing education and rationale, to monitor blood pressure daily and record, calling PCP for findings outside  established parameters;  Reviewed scheduled/upcoming provider appointments including:   Pain:  (Status: New goal.) Long Term Goal  Pain assessment performed Medications reviewed Discussed importance of adherence to all scheduled medical appointments; Reviewed with patient prescribed pharmacological and nonpharmacological pain relief strategies;  Patient Goals/Self-Care Activities: Take medications as prescribed   Attend all scheduled provider appointments Call pharmacy for medication refills 3-7 days in advance of running out of medications call office if I gain more than 2 pounds in one day or 5 pounds in one week track weight in diary use salt in moderation watch for swelling in feet, ankles and legs every day weigh myself daily follow rescue plan if symptoms flare-up track symptoms and what helps feel better or worse check blood pressure daily choose a place to take my blood pressure (home, clinic or office, retail store) write blood pressure results in a log or diary keep a blood pressure log take blood pressure log to all doctor appointments call doctor for signs and symptoms of high blood pressure develop an action plan for high blood pressure keep all doctor appointments take medications for blood pressure exactly as prescribed report new symptoms to your doctor        Consent to CCM Services: Mark Mejia was given information about Chronic Care Management services including:  CCM service includes personalized support from designated clinical staff  supervised by his physician, including individualized plan of care and coordination with other care providers 24/7 contact phone numbers for assistance for urgent and routine care needs. Service will only be billed when office clinical staff spend 20 minutes or more in a month to coordinate care. Only one practitioner may furnish and bill the service in a calendar month. The patient may stop CCM services at any time (effective  at the end of the month) by phone call to the office staff. The patient will be responsible for cost sharing (co-pay) of up to 20% of the service fee (after annual deductible is met).  Patient agreed to services and verbal consent obtained.   The patient verbalized understanding of instructions, educational materials, and care plan provided today and agreed to receive a mailed copy of patient instructions, educational materials, and care plan.   Telephone follow up appointment with care management team member scheduled for: 03/24/2021  at 1:30pm

## 2021-02-28 DIAGNOSIS — I5083 High output heart failure: Secondary | ICD-10-CM | POA: Diagnosis not present

## 2021-02-28 DIAGNOSIS — I110 Hypertensive heart disease with heart failure: Secondary | ICD-10-CM

## 2021-02-28 DIAGNOSIS — E782 Mixed hyperlipidemia: Secondary | ICD-10-CM | POA: Diagnosis not present

## 2021-02-28 DIAGNOSIS — I130 Hypertensive heart and chronic kidney disease with heart failure and stage 1 through stage 4 chronic kidney disease, or unspecified chronic kidney disease: Secondary | ICD-10-CM

## 2021-02-28 DIAGNOSIS — I5030 Unspecified diastolic (congestive) heart failure: Secondary | ICD-10-CM | POA: Diagnosis not present

## 2021-03-05 ENCOUNTER — Encounter: Payer: Self-pay | Admitting: Cardiology

## 2021-03-05 ENCOUNTER — Ambulatory Visit (INDEPENDENT_AMBULATORY_CARE_PROVIDER_SITE_OTHER): Payer: Medicare PPO

## 2021-03-05 ENCOUNTER — Other Ambulatory Visit: Payer: Self-pay

## 2021-03-05 ENCOUNTER — Ambulatory Visit: Payer: Medicare PPO | Admitting: Cardiology

## 2021-03-05 VITALS — BP 126/70 | HR 65 | Ht 70.0 in | Wt 211.2 lb

## 2021-03-05 DIAGNOSIS — R42 Dizziness and giddiness: Secondary | ICD-10-CM

## 2021-03-05 DIAGNOSIS — I130 Hypertensive heart and chronic kidney disease with heart failure and stage 1 through stage 4 chronic kidney disease, or unspecified chronic kidney disease: Secondary | ICD-10-CM

## 2021-03-05 DIAGNOSIS — I5083 High output heart failure: Secondary | ICD-10-CM

## 2021-03-05 DIAGNOSIS — Z8521 Personal history of malignant neoplasm of larynx: Secondary | ICD-10-CM | POA: Diagnosis not present

## 2021-03-05 DIAGNOSIS — R0609 Other forms of dyspnea: Secondary | ICD-10-CM

## 2021-03-05 DIAGNOSIS — I739 Peripheral vascular disease, unspecified: Secondary | ICD-10-CM

## 2021-03-05 NOTE — Progress Notes (Signed)
Cardiology Office Note:    Date:  03/05/2021   ID:  Mark Mejia, DOB Jun 03, 1955, MRN [id number]  PCP:  Mark Brome, MD  Cardiologist:  Mark Campus, MD    Referring MD: Mark Brome, MD   Chief Complaint  Patient presents with   Follow-up  I am weak and tired  History of Present Illness:    Mark Mejia is a 66 y.o. male with past medical history significant for atypical chest pain, cardiac catheterization done in 2019 showed no obstructive coronary disease also carotic arterial disease with last carotic ultrasounds done in May of last year showing only up to 59% stenosis bilaterally.  Also history of congestive heart failure which is diastolic in nature, essential hypertension, dyslipidemia. He comes today to my office with his wife.  He complains of feeling weak tired exhausted.  She continues to cut grass but he cannot do it anymore anytime he tried to do something get very tired very easily and have to stop and rest.  Also described to have some difficulty sleeping.  He was tested recently for sleep apnea that was negative.  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   Hypertrophic scar of skin 09/29/2016   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   Mixed hyperlipidemia    Peripheral vascular disease, asymptomatic ([place]) 05/23/2017   80-99% stenosis in the left internal coronary artery    Precordial chest pain 12/26/2015   Shortness of breath 12/26/2015   Testicular hypofunction    Vitamin D deficiency     Past Surgical History:  Procedure Laterality Date   GASTROSTOMY W/ FEEDING TUBE     LEFT HEART CATH AND CORONARY ANGIOGRAPHY N/A 05/25/2017   Procedure: LEFT HEART CATH AND CORONARY ANGIOGRAPHY;  Surgeon: Martinique, Peter M, MD;  Location: [facility];  Service: Cardiovascular;  Laterality: N/A;   PORTACATH PLACEMENT     THROAT SURGERY      Current Medications: Current Meds  Medication Sig   amLODipine (NORVASC) 5 MG tablet Take 5 mg by mouth daily.   aspirin EC 81 MG tablet Take 81 mg by mouth daily.    dorzolamide (TRUSOPT) 2 % ophthalmic solution Place 1 drop into the right eye 2 (two) times daily.   furosemide (LASIX) 20 MG tablet Take 20 mg by mouth daily.   Glycerin-Hypromellose-PEG 400 ([facility] DRY EYE RELIEF OP) Place 2 drops into both eyes daily as needed (for dry eyes).   HYDROcodone-acetaminophen (NORCO/VICODIN) 5-325 MG tablet Take 1 tablet by mouth every 6 (six) hours as needed for moderate pain.   latanoprost (XALATAN) 0.005 % ophthalmic solution Place 1 drop into the right eye at bedtime.   levothyroxine (SYNTHROID) 75 MCG tablet Take 75 mcg by mouth daily before breakfast.   meloxicam (MOBIC) 7.5 MG tablet TAKE 1 TABLET (7.5 MG TOTAL) BY MOUTH 2 (TWO) TIMES DAILY.   nitroGLYCERIN (NITROSTAT) 0.4  MG SL tablet Place 0.4 mg under the tongue every 5 (five) minutes as needed for chest pain.   omeprazole (PRILOSEC) 40 MG capsule Take 40 mg by mouth daily.   PREDNISOLONE PO Take 50 mg by mouth as needed (Leg pain).   rosuvastatin (CRESTOR) 20 MG tablet Take 20 mg by mouth daily.   sildenafil (VIAGRA) 100 MG tablet Take 1 tablet (100 mg total) by mouth daily as needed for erectile dysfunction. Take 1/2 to 1 tablet 30-60 minutes prior to intercourse.   tamsulosin (FLOMAX) 0.4 MG CAPS capsule Take 2 capsules (0.8 mg total) by mouth daily.   testosterone  cypionate (DEPOTESTOTERONE CYPIONATE) 100 MG/ML injection Inject 200 mg into the muscle every 14 (fourteen) days. For IM use only   tiZANidine (ZANAFLEX) 4 MG tablet Take 4 mg by mouth every 6 (six) hours as needed for muscle spasms.   Vitamin D, Ergocalciferol, (DRISDOL) 1.25 MG (50000 UNIT) CAPS capsule TAKE 1 CAPSULE (50,000 UNITS TOTAL) BY MOUTH EVERY SUNDAY.     Allergies:   Patient has no known allergies.   Social History   Socioeconomic History   Marital status: Married    Spouse name: Mark Mejia   Number of children: 1   Years of education: college   Highest education level: Not on file  Occupational History   Occupation: Retired  Tobacco Use   Smoking status: Never   Smokeless tobacco: Never  Vaping Use   Vaping Use: Never used  Substance and Sexual Activity   Alcohol use: No   Drug use: No   Sexual activity: Not on file  Other Topics Concern   Not on file  Social History Narrative   Currently retired/disabled   Social Determinants of Health   Financial Resource Strain: Low Risk    Difficulty of Paying Living Expenses: Not hard at all  Food Insecurity: No Food Insecurity   Worried About Charity fundraiser in the Last Year: Never true   [place] in the Last Year: Never true  Transportation Needs: No Transportation Needs   Lack of Transportation (Medical): No   Lack of Transportation (Non-Medical): No  Physical Activity: Not on file  Stress: Not on file  Social Connections: Not on file     Family History: The patient's family history includes Arthritis in his mother; Cancer - Other in his maternal grandmother; Hypertension in his mother. ROS:   Please see the history of present illness.    All 14 point review of systems negative except as described per history of present illness  EKGs/Labs/Other Studies Reviewed:      Recent Labs: 11/26/2020: TSH 4.390 01/02/2021: ALT 25; BUN 9; Creatinine, Ser 1.16; Hemoglobin 13.8; Platelets 148; Potassium 4.1;  Sodium 139  Recent Lipid Panel    Component Value Date/Time   CHOL 94 (L) 01/02/2021 0956   TRIG 89 01/02/2021 0956   HDL 24 (L) 01/02/2021 0956   CHOLHDL 3.9 01/02/2021 0956   LDLCALC 52 01/02/2021 0956    Physical Exam:    VS:  BP 126/70 (BP Location: Right Arm, Patient Position: Sitting)    Pulse 65    Ht 5\' 10"  (1.778 m)    Wt 211 lb 3.2 oz (95.8 kg)    SpO2 96%    BMI 30.30 kg/m     Wt Readings from Last 3 Encounters:  03/05/21 211 lb 3.2 oz (95.8 kg)  02/17/21 205 lb (93 kg)  01/29/21 220 lb (99.8 kg)     GEN:  Well nourished, well developed in no acute distress HEENT: Normal NECK: No JVD; No carotid bruits LYMPHATICS: No lymphadenopathy CARDIAC: RRR, no murmurs, no rubs, no gallops RESPIRATORY:  Clear to auscultation without rales, wheezing or rhonchi  ABDOMEN: Soft, non-tender, non-distended MUSCULOSKELETAL:  No edema; No deformity  SKIN: Warm and dry LOWER EXTREMITIES: no swelling NEUROLOGIC:  Alert and oriented x 3 PSYCHIATRIC:  Normal affect   ASSESSMENT:    1. Peripheral vascular disease, asymptomatic ([place])   2. History of laryngeal cancer   3. Dyspnea on exertion   4. 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  I will schedule him to have another carotic ultrasound because of symptoms of dizziness he complains about.  He said that he got foggy brain sometimes and sometimes he get dizzy but not to the point of passing out or falling down. History of laryngeal cancer that be followed by internal medicine team Dyspnea exertion and fatigue I will ask him to have an echocardiogram to assess left ventricle ejection fraction Hypertension blood pressure seems to be well controlled continue present management. Dyslipidemia I did review his K PN dated from November of last year show HDL of 24 LDL 52.   Medication  Adjustments/Labs and Tests Ordered: Current medicines are reviewed at length with the patient today.  Concerns regarding medicines are outlined above.  No orders of the defined types were placed in this encounter.  Medication changes: No orders of the defined types were placed in this encounter.   Signed, Park Liter, MD, [facility] 03/05/2021 1:23 PM    [place]

## 2021-03-05 NOTE — Patient Instructions (Signed)
Medication Instructions:  Your physician recommends that you continue on your current medications as directed. Please refer to the Current Medication list given to you today.  *If you need a refill on your cardiac medications before your next appointment, please call your pharmacy*   Lab Work: None If you have labs (blood work) drawn today and your tests are completely normal, you will receive your results only by: Mark Mejia (if you have MyChart) OR A paper copy in the mail If you have any lab test that is abnormal or we need to change your treatment, we will call you to review the results.   Testing/Procedures: A zio monitor was ordered today. It will remain on for 14 days. You will then return monitor and event diary in provided box. It takes 1-2 weeks for report to be downloaded and returned to [place]. We will call you with the results. If monitor falls off or has orange flashing light, please call Zio for further instructions.    Your physician has requested that you have an echocardiogram. Echocardiography is a painless test that uses sound waves to create images of your heart. It provides your doctor with information about the size and shape of your heart and how well your hearts chambers and valves are working. This procedure takes approximately one hour. There are no restrictions for this procedure.   Your physician has requested that you have a carotid duplex. This test is an ultrasound of the carotid arteries in your neck. It looks at blood flow through these arteries that supply the brain with blood. Allow one hour for this exam. There are no restrictions or special instructions.    Follow-Up: At [facility], you and your health needs are our priority.  As part of our continuing mission to provide you with exceptional heart care, we have created designated Provider Care Teams.  These Care Teams include your primary Cardiologist (physician) and Advanced Practice Providers (APPs  -  Physician Assistants and Nurse Practitioners) who all work together to provide you with the care you need, when you need it.  We recommend signing up for the patient portal called "MyChart".  Sign up information is provided on this After Visit Summary.  MyChart is used to connect with patients for Virtual Visits (Telemedicine).  Patients are able to view lab/test results, encounter notes, upcoming appointments, etc.  Non-urgent messages can be sent to your provider as well.   To learn more about what you can do with MyChart, go to [email or web address].    Your next appointment:   5 month(s)  The format for your next appointment:   In Person  Provider:   Jenne Campus, MD    Other Instructions Echocardiogram An echocardiogram is a test that uses sound waves (ultrasound) to produce images of the heart. Images from an echocardiogram can provide important information about: Heart size and shape. The size and thickness and movement of your heart's walls. Heart muscle function and strength. Heart valve function or if you have stenosis. Stenosis is when the heart valves are too narrow. If blood is flowing backward through the heart valves (regurgitation). A tumor or infectious growth around the heart valves. Areas of heart muscle that are not working well because of poor blood flow or injury from a heart attack. Aneurysm detection. An aneurysm is a weak or damaged part of an artery wall. The wall bulges out from the normal force of blood pumping through the body. Tell a health care provider about: Any  allergies you have. All medicines you are taking, including vitamins, herbs, eye drops, creams, and over-the-counter medicines. Any blood disorders you have. Any surgeries you have had. Any medical conditions you have. Whether you are pregnant or may be pregnant. What are the risks? Generally, this is a safe test. However, problems may occur, including an allergic reaction to dye  (contrast) that may be used during the test. What happens before the test? No specific preparation is needed. You may eat and drink normally. What happens during the test?  You will take off your clothes from the waist up and put on a hospital gown. Electrodes or electrocardiogram (ECG)patches may be placed on your chest. The electrodes or patches are then connected to a device that monitors your heart rate and rhythm. You will lie down on a table for an ultrasound exam. A gel will be applied to your chest to help sound waves pass through your skin. A handheld device, called a transducer, will be pressed against your chest and moved over your heart. The transducer produces sound waves that travel to your heart and bounce back (or "echo" back) to the transducer. These sound waves will be captured in real-time and changed into images of your heart that can be viewed on a video monitor. The images will be recorded on a computer and reviewed by your health care provider. You may be asked to change positions or hold your breath for a short time. This makes it easier to get different views or better views of your heart. In some cases, you may receive contrast through an IV in one of your veins. This can improve the quality of the pictures from your heart. The procedure may vary among health care providers and hospitals. What can I expect after the test? You may return to your normal, everyday life, including diet, activities, and medicines, unless your health care provider tells you not to do that. Follow these instructions at home: It is up to you to get the results of your test. Ask your health care provider, or the department that is doing the test, when your results will be ready. Keep all follow-up visits. This is important. Summary An echocardiogram is a test that uses sound waves (ultrasound) to produce images of the heart. Images from an echocardiogram can provide important information about the  size and shape of your heart, heart muscle function, heart valve function, and other possible heart problems. You do not need to do anything to prepare before this test. You may eat and drink normally. After the echocardiogram is completed, you may return to your normal, everyday life, unless your health care provider tells you not to do that. This information is not intended to replace advice given to you by your health care provider. Make sure you discuss any questions you have with your health care provider. Document Revised: 10/29/2020 Document Reviewed: 10/09/2019 Elsevier Patient Education  2022 [employer].

## 2021-03-05 NOTE — Addendum Note (Signed)
Addended by: Edwyna Shell I on: 03/05/2021 01:39 PM   Modules accepted: Orders

## 2021-03-11 ENCOUNTER — Other Ambulatory Visit: Payer: Self-pay

## 2021-03-11 DIAGNOSIS — R351 Nocturia: Secondary | ICD-10-CM

## 2021-03-11 DIAGNOSIS — N401 Enlarged prostate with lower urinary tract symptoms: Secondary | ICD-10-CM

## 2021-03-11 MED ORDER — TIZANIDINE HCL 4 MG PO TABS: 4.0000 mg | ORAL_TABLET | Freq: Four times a day (QID) | ORAL | 1 refills | Status: DC | PRN

## 2021-03-11 MED ORDER — TAMSULOSIN HCL 0.4 MG PO CAPS: 0.8000 mg | ORAL_CAPSULE | Freq: Every day | ORAL | 3 refills | Status: DC

## 2021-03-11 NOTE — Telephone Encounter (Signed)
Patient calling as he recently spoke with [facility] and will be changing to their mail order pharmacy. He is currently out of the two medications pended. He requests short supply be sent until he can receive first shipment.   Mark Mejia, [place] 03/11/21 10:37 AM

## 2021-03-18 ENCOUNTER — Ambulatory Visit (INDEPENDENT_AMBULATORY_CARE_PROVIDER_SITE_OTHER): Payer: Medicare PPO

## 2021-03-18 ENCOUNTER — Other Ambulatory Visit: Payer: Self-pay

## 2021-03-18 DIAGNOSIS — I739 Peripheral vascular disease, unspecified: Secondary | ICD-10-CM

## 2021-03-18 DIAGNOSIS — I5083 High output heart failure: Secondary | ICD-10-CM | POA: Diagnosis not present

## 2021-03-18 DIAGNOSIS — R0609 Other forms of dyspnea: Secondary | ICD-10-CM

## 2021-03-18 DIAGNOSIS — I6523 Occlusion and stenosis of bilateral carotid arteries: Secondary | ICD-10-CM | POA: Diagnosis not present

## 2021-03-18 DIAGNOSIS — Z8521 Personal history of malignant neoplasm of larynx: Secondary | ICD-10-CM | POA: Diagnosis not present

## 2021-03-18 DIAGNOSIS — I130 Hypertensive heart and chronic kidney disease with heart failure and stage 1 through stage 4 chronic kidney disease, or unspecified chronic kidney disease: Secondary | ICD-10-CM

## 2021-03-18 LAB — ECHOCARDIOGRAM COMPLETE
Area-P 1/2: 3.46 cm2
S' Lateral: 3.1 cm

## 2021-03-20 ENCOUNTER — Telehealth: Payer: Self-pay

## 2021-03-20 NOTE — Telephone Encounter (Signed)
Patient notified of results.

## 2021-03-20 NOTE — Telephone Encounter (Signed)
-----   Message from Park Liter, MD sent at 03/19/2021  1:23 PM EST ----- Echocardiogram showed preserved left ventricle ejection fraction mild mitral valve regurgitation, overall looks good left atrium is moderately enlarged

## 2021-03-24 ENCOUNTER — Ambulatory Visit (INDEPENDENT_AMBULATORY_CARE_PROVIDER_SITE_OTHER): Payer: Medicare PPO

## 2021-03-24 DIAGNOSIS — R0609 Other forms of dyspnea: Secondary | ICD-10-CM | POA: Diagnosis not present

## 2021-03-24 DIAGNOSIS — I110 Hypertensive heart disease with heart failure: Secondary | ICD-10-CM

## 2021-03-24 DIAGNOSIS — I739 Peripheral vascular disease, unspecified: Secondary | ICD-10-CM | POA: Diagnosis not present

## 2021-03-24 DIAGNOSIS — E782 Mixed hyperlipidemia: Secondary | ICD-10-CM

## 2021-03-24 NOTE — Patient Instructions (Addendum)
Visit Information  Thank you for taking time to visit with me today. Please don't hesitate to contact me if I can be of assistance to you before our next scheduled telephone appointment.  Following are the goals we discussed today:  Care Plan : RN care manager plan of care  Updates made by Thana Ates, RN since 03/24/2021 12:00 AM     Problem: No plan of care established for management of chronic disease states ( chronic pain, CHF, Hypertension, HLD)   Priority: High     Long-Range Goal: Development of plan of care for chronic disease management (Chronic pain, CHF, hypertension, HLD)   Start Date: 02/17/2021  Expected End Date: 02/17/2022  Priority: High  Note:   Current Barriers:  Chronic Disease Management support and education needs related to CHF, HTN, HLD, and chronic pain No advanced directives. Reports he has an advanced directive but it needs to be updated.  02/17/2021 Patient reports that he is taking his medications as prescribed. Reports he is considering changing back to the [facility] mail in pharmacy. Patient reports that he is not taking his Lasix because he thought he made him swell. Reports that he is doing to restart this medication. Reports that he is not monitoring his weight or blood pressure. Patient reports chronic pain and take medications. Reports that he is not able to take the muscle relaxer except at night because it makes him sleepy.  Reports he is cancer free. 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 This was stretched 13 months ago. 03/24/2021  Patient reports that he has a cold. States that his wife and son turn down the heat at night. Reports that he has had congestion for 1 day.  Reports that he has not had an exposure to covid.  Reports he has switched back to [facility] mail order pharmacy.    Heart failure- reports weighing daily. Range 206-208.  Reports he restarted taking his lasix. Reports decrease swelling in his  legs and feet. Hypertension-  reports that he is self monitoring daily.  BP range of 119-145/68-77.  Reports that he is taking his medications as prescribed.  Pain- reports pain of 3/10 today. Reports pain worse in the mornings. Reports taking his medications as prescribed. Denies any new problems or concerns today.  RNCM Clinical Goal(s):  Patient will verbalize understanding of plan for management of CHF, HTN, HLD, and Chronic pain as evidenced by self reporting of improve health take all medications exactly as prescribed and will call provider for medication related questions as evidenced by patient report    attend all scheduled medical appointments: including PCP and specialist as evidenced by patient report.        collaborate with the care management team towards completion of advanced directives by patient as evidenced by verbalizing completion  through collaboration with RN Care manager, provider, and care team.   Interventions: 1:1 collaboration with primary care provider regarding development and update of comprehensive plan of care as evidenced by provider attestation and co-signature Inter-disciplinary care team collaboration (see longitudinal plan of care) Evaluation of current treatment plan related to  self management and patient's adherence to plan as established by provider   Heart Failure Interventions:  (Status: Goal on Track (progressing): YES.)  Long Term Goal  Reviewed Heart Failure Action Plan in depth and provided written copy Provided education about placing scale on hard, flat surface Advised patient to weigh each morning after emptying bladder  Discussed importance of daily weight and advised patient to weigh and record daily Reviewed role of diuretics in prevention of fluid overload and management of heart failure Discussed the importance of keeping all appointments with provider Encouraged patient to continue to weigh daily. Reviewed weight ranges.   Hyperlipidemia:   (Status: Goal on Track (progressing): YES.) Long Term Goal  Lab Results  Component Value Date   CHOL 94 (L) 01/02/2021   HDL 24 (L) 01/02/2021   LDLCALC 52 01/02/2021   TRIG 89 01/02/2021   CHOLHDL 3.9 01/02/2021     Medication review performed; medication list updated in electronic medical record.  Provider established cholesterol goals reviewed; Reviewed role and benefits of statin for ASCVD risk reduction;  Hypertension: (Status: New goal.) Long Term Goal Last practice recorded BP readings:  BP Readings from Last 3 Encounters:  02/06/21 128/78  01/02/21 108/70  11/26/20 136/84  Most recent eGFR/CrCl:  Lab Results  Component Value Date   EGFR 69 01/02/2021    No components found for: CRCL  Evaluation of current treatment plan related to hypertension self management and patient's adherence to plan as established by provider;   Reviewed prescribed diet low salt diet. Reviewed using dash Reviewed medications with patient and discussed importance of compliance;  Advised patient, providing education and rationale, to monitor blood pressure daily and record, calling PCP for findings outside established parameters;  Reviewed scheduled/upcoming provider appointments including:   Pain:  (Status: New goal.) Long Term Goal  Today's Vitals   03/24/21 1452  PainSc: 3     Pain assessment performed Medications reviewed Discussed importance of adherence to all scheduled medical appointments; Reviewed with patient prescribed pharmacological and nonpharmacological pain relief strategies; Encouraged patient to continue to take his medications as prescribed. Reviewed with patient that he switched back to [facility] mail order pharmacy.  Patient Goals/Self-Care Activities: Take medications as prescribed   Attend all scheduled provider appointments Call pharmacy for medication refills 3-7 days in advance of running out of medications call office if I gain more than 2 pounds in one day or 5  pounds in one week track weight in diary use salt in moderation watch for swelling in feet, ankles and legs every day weigh myself daily follow rescue plan if symptoms flare-up track symptoms and what helps feel better or worse check blood pressure daily choose a place to take my blood pressure (home, clinic or office, retail store) write blood pressure results in a log or diary keep a blood pressure log take blood pressure log to all doctor appointments call doctor for signs and symptoms of high blood pressure develop an action plan for high blood pressure keep all doctor appointments take medications for blood pressure exactly as prescribed report new symptoms to your doctor        Our next appointment is by telephone on 05/19/2021 at 2:15pm  Please call the care guide team at [phone] if you need to cancel or reschedule your appointment.   If you are experiencing a Mental Health or [place] or need someone to talk to, please call the Suicide and Crisis Lifeline: 988 call the [place] National Suicide Prevention Lifeline: 9072206830 or TTY: 7748769570 TTY 516-663-5317) to talk to a trained counselor call 1-800-273-TALK (toll free, 24 hour hotline) call 911   The patient verbalized understanding of instructions, educational materials, and care plan provided today and agreed to receive a mailed copy of patient instructions, educational materials, and care plan.   Tomasa Rand RN, BSN,  CEN RN Case Manager - [facility] [employer] officer Mobile: [phone]

## 2021-03-24 NOTE — Chronic Care Management (AMB) (Signed)
°Chronic Care Management  ° °CCM RN Visit Note ° °03/24/2021 °Name: Mark Mejia MRN: [id number] DOB: 08/31/1955 ° °Subjective: °Mark Mejia is a 65 y.o. year old male who is a primary care patient of [facility], Kirsten, MD. The care management team was consulted for assistance with disease management and care coordination needs.   ° °Engaged with patient by telephone for follow up visit in response to provider referral for case management and/or care coordination services.  ° °Consent to Services:  °The patient was given information about Chronic Care Management services, agreed to services, and gave verbal consent prior to initiation of services.  Please see initial visit note for detailed documentation.  ° °Patient agreed to services and verbal consent obtained.  ° °Assessment: Review of patient past medical history, allergies, medications, health status, including review of consultants reports, laboratory and other test data, was performed as part of comprehensive evaluation and provision of chronic care management services.  ° °SDOH (Social Determinants of Health) assessments and interventions performed:   ° °CCM Care Plan ° °No Known Allergies ° °Outpatient Encounter Medications as of 03/24/2021  °Medication Sig  ° amLODipine (NORVASC) 5 MG tablet Take 5 mg by mouth daily.  ° aspirin EC 81 MG tablet Take 81 mg by mouth daily.   ° dorzolamide (TRUSOPT) 2 % ophthalmic solution Place 1 drop into the right eye 2 (two) times daily.  ° furosemide (LASIX) 20 MG tablet Take 20 mg by mouth daily.  ° Glycerin-Hypromellose-PEG 400 ([employer] DRY EYE RELIEF OP) Place 2 drops into both eyes daily as needed (for dry eyes).  ° HYDROcodone-acetaminophen (NORCO/VICODIN) 5-325 MG tablet Take 1 tablet by mouth every 6 (six) hours as needed for moderate pain.  ° latanoprost (XALATAN) 0.005 % ophthalmic solution Place 1 drop into the right eye at bedtime.  ° levothyroxine (SYNTHROID) 75 MCG tablet Take 75 mcg by mouth daily before breakfast.   ° meloxicam (MOBIC) 7.5 MG tablet TAKE 1 TABLET (7.5 MG TOTAL) BY MOUTH 2 (TWO) TIMES DAILY.  ° nitroGLYCERIN (NITROSTAT) 0.4 MG SL tablet Place 0.4 mg under the tongue every 5 (five) minutes as needed for chest pain.  ° omeprazole (PRILOSEC) 40 MG capsule Take 40 mg by mouth daily.  ° PREDNISOLONE PO Take 50 mg by mouth as needed (Leg pain).  ° rosuvastatin (CRESTOR) 20 MG tablet Take 20 mg by mouth daily.  ° sildenafil (VIAGRA) 100 MG tablet Take 1 tablet (100 mg total) by mouth daily as needed for erectile dysfunction. Take 1/2 to 1 tablet 30-60 minutes prior to intercourse.  ° tamsulosin (FLOMAX) 0.4 MG CAPS capsule Take 2 capsules (0.8 mg total) by mouth daily.  ° testosterone cypionate (DEPOTESTOTERONE CYPIONATE) 100 MG/ML injection Inject 200 mg into the muscle every 14 (fourteen) days. For IM use only  ° tiZANidine (ZANAFLEX) 4 MG tablet Take 1 tablet (4 mg total) by mouth every 6 (six) hours as needed for muscle spasms.  ° Vitamin D, Ergocalciferol, (DRISDOL) 1.25 MG (50000 UNIT) CAPS capsule TAKE 1 CAPSULE (50,000 UNITS TOTAL) BY MOUTH EVERY SUNDAY.  ° °No facility-administered encounter medications on file as of 03/24/2021.  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) 01/02/2021  ° Aortic atherosclerosis (HCC) 01/02/2021  ° Atherosclerosis of native artery of both lower extremities with intermittent claudication (HCC) 01/02/2021  ° Apnea 11/26/2020  ° Snoring 11/26/2020  ° Somnolence, daytime 11/26/2020  ° Lumbar back pain   11/26/2020  ° Other fatigue 11/26/2020  ° Need for immunization against influenza 11/26/2020  ° Overweight (BMI 25.0-29.9) 11/26/2020  ° CAD (coronary artery disease)   ° GERD (gastroesophageal reflux disease)   ° Hepatitis C   ° History of esophageal cancer   ° Testicular hypofunction   ° Vitamin D deficiency   ° Mixed hyperlipidemia 06/14/2019  ° 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/2019  ° 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   ° Benign prostatic hyperplasia with nocturia 06/14/2019  ° 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  ° Chest pain of uncertain etiology 05/25/2017  ° Angina pectoris (HCC) 05/25/2017  ° Peripheral vascular disease, asymptomatic (HCC) 05/23/2017  ° Hypertrophic scar of skin 09/29/2016  ° Encounter for annual wellness exam in Medicare patient 03/10/2016  ° Encounter for adjustment and management of vascular access device 03/10/2016  ° 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  ° Precordial chest pain 12/26/2015  ° Dyspnea on exertion 12/26/2015  ° ° °Conditions to be addressed/monitored:CHF, HTN, HLD, and chronic pain ° °Care Plan : RN care manager plan of care  °Updates made by Cook, Amanda U, RN since 03/24/2021 12:00 AM  °  ° °Problem: No plan of care established for management of chronic disease states ( chronic pain, CHF, Hypertension, HLD)   °Priority: High  °  ° °Long-Range Goal: Development of plan of care for chronic disease management (Chronic pain, CHF, hypertension, HLD)   °Start Date: 02/17/2021  °Expected End Date: 02/17/2022  °Priority: High  °Note:   °Current Barriers:  °Chronic Disease Management support and education needs related to CHF, HTN, HLD, and chronic pain °No advanced directives. Reports he has an advanced directive but it needs to be updated.  °02/17/2021 Patient reports that he is taking his medications as prescribed. Reports he is considering changing back to the [facility] mail in pharmacy. Patient reports that he is not taking his Lasix because he thought he made him swell. Reports that he is doing to restart this medication. Reports that he is not monitoring his weight or blood pressure. Patient reports chronic pain and take medications. Reports that he is not able to take the muscle relaxer except at night because it makes him sleepy.  Reports he is cancer free. States that he has to have his esophagus stretched every 3 years due to scar  tissue. This was stretched 13 months ago. °03/24/2021  Patient reports that he has a cold. States that his wife and son turn down the heat at night. Reports that he has had congestion for 1 day.  Reports that he has not had an exposure to covid.  Reports he has switched back to [facility] mail order pharmacy.    Heart failure- reports weighing daily. Range 206-208.  Reports he restarted taking his lasix. Reports decrease swelling in his legs and feet. Hypertension-  reports that he is self monitoring daily.  BP range of 119-145/68-77.  Reports that he is taking his medications as prescribed.  Pain- reports pain of 3/10 today. Reports pain worse in the mornings. Reports taking his medications as prescribed. Denies any new problems or concerns today.  °RNCM Clinical Goal(s):  °Patient will verbalize understanding of plan for management of CHF, HTN, HLD, and Chronic pain as evidenced by self reporting of improve health °take all medications exactly as prescribed and will call provider for medication related questions as evidenced by patient report    °attend all scheduled medical appointments: including PCP and specialist as   evidenced by patient report.        °collaborate with the care management team towards completion of advanced directives by patient as evidenced by verbalizing completion  through collaboration with RN Care manager, provider, and care team.  ° °Interventions: °1:1 collaboration with primary care provider regarding development and update of comprehensive plan of care as evidenced by provider attestation and co-signature °Inter-disciplinary care team collaboration (see longitudinal plan of care) °Evaluation of current treatment plan related to  self management and patient's adherence to plan as established by provider ° ° °Heart Failure Interventions:  (Status: Goal on Track (progressing): YES.)  Long Term Goal  °Reviewed Heart Failure Action Plan in depth and provided written copy °Provided education  about placing scale on hard, flat surface °Advised patient to weigh each morning after emptying bladder °Discussed importance of daily weight and advised patient to weigh and record daily °Reviewed role of diuretics in prevention of fluid overload and management of heart failure °Discussed the importance of keeping all appointments with provider °Encouraged patient to continue to weigh daily. Reviewed weight ranges.  ° °Hyperlipidemia:  (Status: Goal on Track (progressing): YES.) Long Term Goal  °Lab Results  °Component Value Date  ° CHOL 94 (L) 01/02/2021  ° HDL 24 (L) 01/02/2021  ° LDLCALC 52 01/02/2021  ° TRIG 89 01/02/2021  ° CHOLHDL 3.9 01/02/2021  °  ° °Medication review performed; medication list updated in electronic medical record.  °Provider established cholesterol goals reviewed; °Reviewed role and benefits of statin for ASCVD risk reduction; ° °Hypertension: (Status: New goal.) Long Term Goal °Last practice recorded BP readings:  °BP Readings from Last 3 Encounters:  °02/06/21 128/78  °01/02/21 108/70  °11/26/20 136/84  °Most recent eGFR/CrCl:  °Lab Results  °Component Value Date  ° EGFR 69 01/02/2021  °  No components found for: CRCL ° °Evaluation of current treatment plan related to hypertension self management and patient's adherence to plan as established by provider;   °Reviewed prescribed diet low salt diet. Reviewed using dash °Reviewed medications with patient and discussed importance of compliance;  °Advised patient, providing education and rationale, to monitor blood pressure daily and record, calling PCP for findings outside established parameters;  °Reviewed scheduled/upcoming provider appointments including:  ° °Pain:  (Status: New goal.) Long Term Goal  °Today's Vitals  ° 03/24/21 1452  °PainSc: 3   °  °Pain assessment performed °Medications reviewed °Discussed importance of adherence to all scheduled medical appointments; °Reviewed with patient prescribed pharmacological and  nonpharmacological pain relief strategies; °Encouraged patient to continue to take his medications as prescribed. Reviewed with patient that he switched back to [facility] mail order pharmacy. ° °Patient Goals/Self-Care Activities: °Take medications as prescribed   °Attend all scheduled provider appointments °Call pharmacy for medication refills 3-7 days in advance of running out of medications °call office if I gain more than 2 pounds in one day or 5 pounds in one week °track weight in diary °use salt in moderation °watch for swelling in feet, ankles and legs every day °weigh myself daily °follow rescue plan if symptoms flare-up °track symptoms and what helps feel better or worse °check blood pressure daily °choose a place to take my blood pressure (home, clinic or office, retail store) °write blood pressure results in a log or diary °keep a blood pressure log °take blood pressure log to all doctor appointments °call doctor for signs and symptoms of high blood pressure °develop an action plan for high blood pressure °keep all doctor appointments °take   take medications for blood pressure exactly as prescribed report new symptoms to your doctor        Plan:Telephone follow up appointment with care management team member scheduled for:  05/19/2021  at 2:15pm Tomasa Rand RN, BSN, CEN RN Case Manager - [facility] [employer] officer Mobile: [phone]

## 2021-03-31 DIAGNOSIS — I5030 Unspecified diastolic (congestive) heart failure: Secondary | ICD-10-CM | POA: Diagnosis not present

## 2021-03-31 DIAGNOSIS — E782 Mixed hyperlipidemia: Secondary | ICD-10-CM | POA: Diagnosis not present

## 2021-03-31 DIAGNOSIS — I110 Hypertensive heart disease with heart failure: Secondary | ICD-10-CM

## 2021-04-01 ENCOUNTER — Other Ambulatory Visit: Payer: Self-pay

## 2021-04-01 DIAGNOSIS — E291 Testicular hypofunction: Secondary | ICD-10-CM

## 2021-04-01 DIAGNOSIS — N401 Enlarged prostate with lower urinary tract symptoms: Secondary | ICD-10-CM

## 2021-04-02 MED ORDER — FUROSEMIDE 20 MG PO TABS: 20.0000 mg | ORAL_TABLET | Freq: Every day | ORAL | 0 refills | Status: DC

## 2021-04-02 MED ORDER — VITAMIN D (ERGOCALCIFEROL) 1.25 MG (50000 UNIT) PO CAPS: 50000.0000 [IU] | ORAL_CAPSULE | ORAL | 1 refills | Status: DC

## 2021-04-02 MED ORDER — SILDENAFIL CITRATE 100 MG PO TABS: 100.0000 mg | ORAL_TABLET | Freq: Every day | ORAL | 5 refills | Status: DC | PRN

## 2021-04-02 MED ORDER — TAMSULOSIN HCL 0.4 MG PO CAPS: 0.8000 mg | ORAL_CAPSULE | Freq: Every day | ORAL | 3 refills | Status: DC

## 2021-04-02 MED ORDER — AMLODIPINE BESYLATE 5 MG PO TABS: 5.0000 mg | ORAL_TABLET | Freq: Every day | ORAL | 1 refills | Status: DC

## 2021-04-02 MED ORDER — LEVOTHYROXINE SODIUM 75 MCG PO TABS: 75.0000 ug | ORAL_TABLET | Freq: Every day | ORAL | 1 refills | Status: DC

## 2021-04-09 DIAGNOSIS — H25813 Combined forms of age-related cataract, bilateral: Secondary | ICD-10-CM | POA: Diagnosis not present

## 2021-04-09 DIAGNOSIS — H401113 Primary open-angle glaucoma, right eye, severe stage: Secondary | ICD-10-CM | POA: Diagnosis not present

## 2021-04-10 ENCOUNTER — Telehealth: Payer: Self-pay

## 2021-04-10 MED ORDER — METOPROLOL TARTRATE 25 MG PO TABS: 25.0000 mg | ORAL_TABLET | Freq: Two times a day (BID) | ORAL | 3 refills | Status: DC

## 2021-04-10 NOTE — Telephone Encounter (Signed)
Patient notified of results and recommendations and agreed with plan. He did asked what is causing the SVT's. Message sent to Dr. Raliegh Ip. Rx sent

## 2021-04-10 NOTE — Telephone Encounter (Signed)
-----   Message from Park Liter, MD sent at 04/08/2021 10:53 AM EST ----- Monitor shows 17 episode of supraventricular tachycardia.  Lets initiate small dose of beta-blocker metoprolol tartrate 25 twice daily

## 2021-04-13 NOTE — Telephone Encounter (Signed)
Patient notified of results and verbalized understanding.  

## 2021-04-16 ENCOUNTER — Ambulatory Visit (INDEPENDENT_AMBULATORY_CARE_PROVIDER_SITE_OTHER): Payer: Medicare PPO

## 2021-04-16 ENCOUNTER — Other Ambulatory Visit: Payer: Self-pay

## 2021-04-16 DIAGNOSIS — I10 Essential (primary) hypertension: Secondary | ICD-10-CM

## 2021-04-16 DIAGNOSIS — G8929 Other chronic pain: Secondary | ICD-10-CM

## 2021-04-16 DIAGNOSIS — E782 Mixed hyperlipidemia: Secondary | ICD-10-CM

## 2021-04-16 DIAGNOSIS — M25562 Pain in left knee: Secondary | ICD-10-CM

## 2021-04-16 DIAGNOSIS — I70213 Atherosclerosis of native arteries of extremities with intermittent claudication, bilateral legs: Secondary | ICD-10-CM

## 2021-04-16 NOTE — Progress Notes (Signed)
Chronic Care Management Pharmacy Note  04/16/2021 Name:  Mark Mejia MRN:  [id number] DOB:  1955-06-20  Summary: -Pleasant 66 year old male presents for f/u CCM visit. He was born in [place] and moved to [place] in 1977 when he joined the [employer] and was stationed in [place]. We spoke extensively about our time in the service. Patient is also a Engineer, maintenance and loves watching them, however he has trouble staying up through the whole game  Recommendations/Changes made from today's visit: -Counsled on Jermyn as supplemental therapy since his CC is nocturnal urination  Subjective: Mark Mejia is an 66 y.o. year old male who is a primary patient of Cox, Kirsten, MD.  The CCM team was consulted for assistance with disease management and care coordination needs.    Engaged with patient by telephone for follow up visit in response to provider referral for pharmacy case management and/or care coordination services.   Consent to Services:  The patient was given the following information about Chronic Care Management services today, agreed to services, and gave verbal consent: 1. CCM service includes personalized support from designated clinical staff supervised by the primary care provider, including individualized plan of care and coordination with other care providers 2. 24/7 contact phone numbers for assistance for urgent and routine care needs. 3. Service will only be billed when office clinical staff spend 20 minutes or more in a month to coordinate care. 4. Only one practitioner may furnish and bill the service in a calendar month. 5.The patient may stop CCM services at any time (effective at the end of the month) by phone call to the office staff. 6. The patient will be responsible for cost sharing (co-pay) of up to 20% of the service fee (after annual deductible is met). Patient agreed to services and consent obtained.  Patient Care Team: Rochel Brome, MD as PCP - General (Family  Medicine) Park Liter, MD as Consulting Physician (Cardiology) Thana Ates, RN as [facility] Management Lane Hacker, [facility] as Consulting Physician (Pharmacist)  Recent office visits:  01-02-2021 Rochel Brome, MD. INCREASE Flomax 0.4 mg daily TO 0.8 mg daily. RDW=  15.6, Platelets= 148, BUN/Creatinine= 8. Cholesterol= 94, HDL= 24. US arterial lower extremity Duplex bilateral. Testosterone injection given.    Recent consult visits:  01-29-2021 Baird Lyons MD (Sleep disorder). Sleep study performed.   Hospital visits:  None in previous 6 months   Objective:  Lab Results  Component Value Date   CREATININE 1.16 01/02/2021   BUN 9 01/02/2021   GFRNONAA 64 12/14/2019   GFRAA 74 12/14/2019   NA 139 01/02/2021   K 4.1 01/02/2021   CALCIUM 9.4 01/02/2021   CO2 25 01/02/2021   GLUCOSE 87 01/02/2021    No results found for: HGBA1C, FRUCTOSAMINE, GFR, MICROALBUR  Last diabetic Eye exam: No results found for: HMDIABEYEEXA  Last diabetic Foot exam: No results found for: HMDIABFOOTEX   Lab Results  Component Value Date   CHOL 94 (L) 01/02/2021   HDL 24 (L) 01/02/2021   LDLCALC 52 01/02/2021   TRIG 89 01/02/2021   CHOLHDL 3.9 01/02/2021    Hepatic Function Latest Ref Rng & Units 01/02/2021 11/26/2020 07/03/2020  Total Protein 6.0 - 8.5 g/dL 7.3 7.1 7.5  Albumin 3.8 - 4.8 g/dL 4.6 4.4 4.8  AST 0 - 40 IU/L 32 31 32  ALT 0 - 44 IU/L 25 24 31   Alk Phosphatase 44 - 121 IU/L 54 61 68  Total Bilirubin 0.0 - 1.2 mg/dL 0.7 0.4 0.5  Bilirubin, Direct 0.00 - 0.40 mg/dL - - -    Lab Results  Component Value Date/Time   TSH 4.390 11/26/2020 12:07 PM   TSH 3.490 07/03/2020 03:16 PM    CBC Latest Ref Rng & Units 01/02/2021 11/26/2020 07/03/2020  WBC 3.4 - 10.8 x10E3/uL 3.9 4.2 5.8  Hemoglobin 13.0 - 17.7 g/dL 13.8 14.1 13.9  Hematocrit 37.5 - 51.0 % 41.0 44.1 42.0  Platelets 150 - 450 x10E3/uL 148(L) 146(L) 145(L)    No results found for:  VD25OH  Clinical ASCVD: Yes  The ASCVD Risk score (Arnett DK, et al., 2019) failed to calculate for the following reasons:   The valid total cholesterol range is 130 to 320 mg/dL    Depression screen [facility] 2/9 02/17/2021 01/02/2021 11/26/2020  Decreased Interest 0 0 0  Down, Depressed, Hopeless 0 0 0  PHQ - 2 Score 0 0 0     Other: (CHADS2VASc if Afib, MMRC or CAT for COPD, ACT, DEXA)  Social History   Tobacco Use  Smoking Status Never  Smokeless Tobacco Never   BP Readings from Last 3 Encounters:  03/05/21 126/70  02/06/21 128/78  01/02/21 108/70   Pulse Readings from Last 3 Encounters:  03/05/21 65  01/02/21 60  11/26/20 61   Wt Readings from Last 3 Encounters:  03/05/21 211 lb 3.2 oz (95.8 kg)  02/17/21 205 lb (93 kg)  01/29/21 220 lb (99.8 kg)   BMI Readings from Last 3 Encounters:  03/05/21 30.30 kg/m  02/17/21 28.59 kg/m  01/29/21 31.57 kg/m    Assessment/Interventions: Review of patient past medical history, allergies, medications, health status, including review of consultants reports, laboratory and other test data, was performed as part of comprehensive evaluation and provision of chronic care management services.   SDOH:  (Social Determinants of Health) assessments and interventions performed: Yes SDOH Interventions    Flowsheet Row Most Recent Value  SDOH Interventions   Financial Strain Interventions Intervention Not Indicated  Transportation Interventions Intervention Not Indicated      SDOH Screenings   Alcohol Screen: Low Risk    Last Alcohol Screening Score (AUDIT): 0  Depression (PHQ2-9): Low Risk    PHQ-2 Score: 0  Financial Resource Strain: Low Risk    Difficulty of Paying Living Expenses: Not hard at all  Food Insecurity: No Food Insecurity   Worried About Charity fundraiser in the Last Year: Never true   Ran Out of Food in the Last Year: Never true  Housing: Low Risk    Last Housing Risk Score: 0  Physical Activity: Not on file   Social Connections: Not on file  Stress: Not on file  Tobacco Use: Low Risk    Smoking Tobacco Use: Never   Smokeless Tobacco Use: Never   Passive Exposure: Not on file  Transportation Needs: No Transportation Needs   Lack of Transportation (Medical): No   Lack of Transportation (Non-Medical): No    CCM Care Plan  No Known Allergies  Medications Reviewed Today     Reviewed by Lane Hacker, [facility] (Pharmacist) on 04/16/21 at [phone]  Med List Status: <None>   Medication Order Taking? Sig Documenting Provider Last Dose Status Informant  amLODipine (NORVASC) 5 MG tablet [id number]  Take 1 tablet (5 mg total) by mouth daily. Rochel Brome, MD  Active   aspirin EC 81 MG tablet [id number]  Take 81 mg by mouth daily.  [provider]  Active  Self  dorzolamide (TRUSOPT) 2 % ophthalmic solution [id number]  Place 1 drop into the right eye 2 (two) times daily. [provider]  Active   furosemide (LASIX) 20 MG tablet [id number]  Take 1 tablet (20 mg total) by mouth daily. CoxElnita Maxwell, MD  Active   Glycerin-Hypromellose-PEG 400 ([facility] DRY EYE RELIEF OP) [id number]  Place 2 drops into both eyes daily as needed (for dry eyes). [provider]  Active Self  HYDROcodone-acetaminophen (NORCO/VICODIN) 5-325 MG tablet [id number]  Take 1 tablet by mouth every 6 (six) hours as needed for moderate pain. Cox, Kirsten, MD  Active   latanoprost (XALATAN) 0.005 % ophthalmic solution [id number]  Place 1 drop into the right eye at bedtime. [provider]  Active   levothyroxine (SYNTHROID) 75 MCG tablet [id number]  Take 1 tablet (75 mcg total) by mouth daily before breakfast. Cox, Kirsten, MD  Active   meloxicam (MOBIC) 7.5 MG tablet [id number]  TAKE 1 TABLET (7.5 MG TOTAL) BY MOUTH 2 (TWO) TIMES DAILY. Rip Harbour, NP  Active   metoprolol tartrate (LOPRESSOR) 25 MG tablet [id number]  Take 1 tablet (25 mg total) by mouth 2 (two) times daily. Park Liter, MD  Active    nitroGLYCERIN (NITROSTAT) 0.4 MG SL tablet [id number]  Place 0.4 mg under the tongue every 5 (five) minutes as needed for chest pain. [provider]  Active Self  omeprazole (PRILOSEC) 40 MG capsule [id number]  Take 40 mg by mouth daily. [provider]  Active   PREDNISOLONE PO [id number]  Take 50 mg by mouth as needed (Leg pain). [provider]  Active   rosuvastatin (CRESTOR) 20 MG tablet [id number]  Take 20 mg by mouth daily. [provider]  Active   sildenafil (VIAGRA) 100 MG tablet [id number]  Take 1 tablet (100 mg total) by mouth daily as needed for erectile dysfunction. Take 1/2 to 1 tablet 30-60 minutes prior to intercourse. Cox, Kirsten, MD  Active   tamsulosin [facility]) 0.4 MG CAPS capsule [id number]  Take 2 capsules (0.8 mg total) by mouth daily. Cox, Kirsten, MD  Active   testosterone cypionate (DEPOTESTOTERONE CYPIONATE) 100 MG/ML injection [id number]  Inject 200 mg into the muscle every 14 (fourteen) days. For IM use only [provider]  Active   tiZANidine (ZANAFLEX) 4 MG tablet [id number]  Take 1 tablet (4 mg total) by mouth every 6 (six) hours as needed for muscle spasms. Cox, Kirsten, MD  Active   Vitamin D, Ergocalciferol, (DRISDOL) 1.25 MG (50000 UNIT) CAPS capsule [id number]  Take 1 capsule (50,000 Units total) by mouth every Sunday. 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 ([place]) 01/02/2021   Aortic atherosclerosis ([place]) 01/02/2021   Atherosclerosis of native artery of both lower extremities with intermittent claudication ([place]) 01/02/2021   Apnea 11/26/2020   Snoring 11/26/2020   Somnolence, daytime 11/26/2020   Lumbar back pain 11/26/2020   Other fatigue 11/26/2020   Need for immunization against influenza 11/26/2020   Overweight (BMI 25.0-29.9) 11/26/2020   CAD (coronary artery disease)    GERD (gastroesophageal reflux  disease)    Hepatitis C    History of esophageal cancer    Testicular hypofunction    Vitamin D deficiency    Mixed hyperlipidemia 06/14/2019   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/2019   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   Benign  prostatic hyperplasia with nocturia 06/14/2019   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[place]) 06/14/2019   Chest pain of uncertain etiology 82/80/0349   Angina pectoris ([place]) 05/25/2017   Peripheral vascular disease, asymptomatic ([place]) 05/23/2017   Hypertrophic scar of skin 09/29/2016   Encounter for annual wellness exam in Medicare patient 03/10/2016   Encounter for adjustment and management of vascular access device 03/10/2016   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   Precordial chest pain 12/26/2015   Dyspnea on exertion 12/26/2015    Immunization History  Administered Date(s) Administered   Fluad Quad(high Dose 65+) 12/14/2019, 11/26/2020   Influenza-Unspecified 12/01/2018   Moderna Covid-19 Vaccine Bivalent Booster 21yr & up 11/26/2020   Moderna Sars-Covid-2 Vaccination 04/27/2019, 05/30/2019, 01/18/2020   Tdap 05/31/2011    Conditions to be addressed/monitored:  Hypertension, Hyperlipidemia, and Hypothyroidism  Care Plan : [place] Updates made by KLane Hacker RPH since 04/16/2021 12:00 AM     Problem: Pain, BPH, PVD   Priority: High  Onset Date: 12/25/2020     Goal: Disease State Management   Start Date: 12/25/2020  Expected End Date: 12/25/2021  Recent Progress: On track  Priority: High  Note:   Current Barriers:  Does not maintain contact with provider office Does not contact provider office for questions/concerns  Pharmacist Clinical Goal(s):  Patient will contact provider office for questions/concerns as evidenced notation of same in electronic health record through collaboration with PharmD and provider.   Interventions: 1:1 collaboration with Cox, KElnita Maxwell MD regarding  development and update of comprehensive plan of care as evidenced by provider attestation and co-signature Inter-disciplinary care team collaboration (see longitudinal plan of care) Comprehensive medication review performed; medication list updated in electronic medical record  Hypertension (BP goal <140/90) BP Readings from Last 3 Encounters:  02/06/21 128/78  01/02/21 108/70  11/26/20 136/84  -Managed by Dr. KAgustin Cree-Controlled -Current treatment: Amlodipine 535mAppropriate, Effective, Safe, Accessible Metoprolol Tart 2565mID Appropriate, Effective, Safe, Accessible -Medications previously tried: N/A  -Current home readings: 137/78 but rarely checks -Current dietary habits: "Tries to eat healthy" -Current exercise habits: None due to pain -Denies hypotensive/hypertensive symptoms -Educated on BP goals and benefits of medications for prevention of heart attack, stroke and kidney damage; -Counseled to monitor BP at home weekly, document, and provide log at future appointments -December 2022: Updated care plan -Recommended to continue current medication.   CAD (LDL goal < 100) The ASCVD Risk score (Arnett DK, et al., 2019) failed to calculate for the following reasons:   The valid total cholesterol range is 130 to 320 mg/dL Lab Results  Component Value Date   CHOL 94 (L) 01/02/2021   CHOL 121 07/03/2020   CHOL 105 12/14/2019   Lab Results  Component Value Date   HDL 24 (L) 01/02/2021   HDL 30 (L) 07/03/2020   HDL 28 (L) 12/14/2019   Lab Results  Component Value Date   LDLCALC 52 01/02/2021   LDLCALC 65 07/03/2020   LDLCALC 62 12/14/2019   Lab Results  Component Value Date   TRIG 89 01/02/2021   TRIG 146 07/03/2020   TRIG 68 12/14/2019   Lab Results  Component Value Date   CHOLHDL 3.9 01/02/2021   CHOLHDL 4.0 07/03/2020   CHOLHDL 3.8 12/14/2019  No results found for: LDLDIRECT  -Controlled -Current treatment: ASA 57m35mpropriate, Effective, Safe,  Accessible Rosuvastatin 20mg39mropriate, Effective, Safe, Accessible -Medications previously tried: None  -Current dietary habits: "Tries to  eat healthy" -Current exercise habits: None due to pain -Educated on Cholesterol goals;  October 2022: Counseled to take ASA separate from Meloxicam, will take ASA PM and meloxicam first thing in AM -Recommended to continue current medication.   BPH (Goal: improve urination) -Not ideally controlled -Night time urination frequency: 2-3x/night but 8x/night without meds -Current treatment: Tamsulosin 0.16m HS Appropriate, Effective, Safe, Accessible -Medications previously tried: N/A October 2022: Counsled on Kegels as supplemental therapy -Recommended to continue current medication.   Chronic Pain -Controlled -Current treatment: Meloxicam 123mQD Appropriate, Effective, Safe, Accessible APAP Appropriate, Effective, Safe, Accessible Hydrocodone/APAP (Takes 1/6 months) Appropriate, Effective, Safe, Accessible Tizanidine 62m57mppropriate, Effective, Safe, Accessible Prednisolone 41m91mN (This helps the best but he knows it's not good for him and takes rarely) Appropriate, Effective, Safe, Accessible -Medications previously tried: NSAID's  -Pain Scale If pain is 3-4/10, will take APAP and bring to 1-2/10. If 8-9/10, will take Tizanidine and bring to 1-2/10.  Aggravating Factors: Lack of sleep, movement Pain Type: musculo-skeletal  -Recommended to continue current medication    Patient Goals/Self-Care Activities Patient will:  - take medications as prescribed  Follow Up Plan: The patient has been provided with contact information for the care management team and has been advised to call with any health related questions or concerns.   NathArizona Constablearm.D. - [phone]  CPP F/U August 2023       Medication Assistance: None required.  Patient affirms current coverage meets needs.  Compliance/Adherence/Medication fill history:    Care Gaps: Last annual wellness visit? None sent message to KimbRhae Hammockschedule. PNA Vac overdue Shingrix overdue   Star Rating Drugs: Rosuvastatin 20 mg- Last filled 11-13-2020 90 DS  Patient's preferred pharmacy is:  [facility] [place] [id number]: [phone]: [id number]: [phone]: [phone] pill box? Yes Pt endorses 100% compliance  Patient would like to use [facility] but is non-preferred. If his insurance changes, will onboard  Care Plan and Follow Up Patient Decision:  Patient agrees to Care Plan and Follow-up.  Plan: The patient has been provided with contact information for the care management team and has been advised to call with any health related questions or concerns.   NathArizona Constablearm.D. - [phone]  CPP F/U August 2023

## 2021-04-16 NOTE — Patient Instructions (Signed)
Visit Information   Goals Addressed   None    Patient Care Plan: CCM Pharmacy Care Plan     Problem Identified: Pain, BPH, PVD   Priority: High  Onset Date: 12/25/2020     Goal: Disease State Management   Start Date: 12/25/2020  Expected End Date: 12/25/2021  Recent Progress: On track  Priority: High  Note:   Current Barriers:  Does not maintain contact with provider office Does not contact provider office for questions/concerns  Pharmacist Clinical Goal(s):  Patient will contact provider office for questions/concerns as evidenced notation of same in electronic health record through collaboration with PharmD and provider.   Interventions: 1:1 collaboration with Cox, Elnita Maxwell, MD regarding development and update of comprehensive plan of care as evidenced by provider attestation and co-signature Inter-disciplinary care team collaboration (see longitudinal plan of care) Comprehensive medication review performed; medication list updated in electronic medical record  Hypertension (BP goal <140/90) BP Readings from Last 3 Encounters:  02/06/21 128/78  01/02/21 108/70  11/26/20 136/84  -Managed by Dr. Agustin Cree -Controlled -Current treatment: Amlodipine 87m Appropriate, Effective, Safe, Accessible Metoprolol Tart 225mBID Appropriate, Effective, Safe, Accessible -Medications previously tried: N/A  -Current home readings: 137/78 but rarely checks -Current dietary habits: "Tries to eat healthy" -Current exercise habits: None due to pain -Denies hypotensive/hypertensive symptoms -Educated on BP goals and benefits of medications for prevention of heart attack, stroke and kidney damage; -Counseled to monitor BP at home weekly, document, and provide log at future appointments -December 2022: Updated care plan -Recommended to continue current medication.   CAD (LDL goal < 100) The ASCVD Risk score (Arnett DK, et al., 2019) failed to calculate for the following reasons:   The  valid total cholesterol range is 130 to 320 mg/dL Lab Results  Component Value Date   CHOL 94 (L) 01/02/2021   CHOL 121 07/03/2020   CHOL 105 12/14/2019   Lab Results  Component Value Date   HDL 24 (L) 01/02/2021   HDL 30 (L) 07/03/2020   HDL 28 (L) 12/14/2019   Lab Results  Component Value Date   LDLCALC 52 01/02/2021   LDLCALC 65 07/03/2020   LDLCALC 62 12/14/2019   Lab Results  Component Value Date   TRIG 89 01/02/2021   TRIG 146 07/03/2020   TRIG 68 12/14/2019   Lab Results  Component Value Date   CHOLHDL 3.9 01/02/2021   CHOLHDL 4.0 07/03/2020   CHOLHDL 3.8 12/14/2019  No results found for: LDLDIRECT  -Controlled -Current treatment: ASA 8171mppropriate, Effective, Safe, Accessible Rosuvastatin 52m36mpropriate, Effective, Safe, Accessible -Medications previously tried: None  -Current dietary habits: "Tries to eat healthy" -Current exercise habits: None due to pain -Educated on Cholesterol goals;  October 2022: Counseled to take ASA separate from Meloxicam, will take ASA PM and meloxicam first thing in AM -Recommended to continue current medication.   BPH (Goal: improve urination) -Not ideally controlled -Night time urination frequency: 2-3x/night but 8x/night without meds -Current treatment: Tamsulosin 0.4mg 68mAppropriate, Effective, Safe, Accessible -Medications previously tried: N/A October 2022: Counsled on Kegels as supplemental therapy -Recommended to continue current medication.   Chronic Pain -Controlled -Current treatment: Meloxicam 15mg 78mppropriate, Effective, Safe, Accessible APAP Appropriate, Effective, Safe, Accessible Hydrocodone/APAP (Takes 1/6 months) Appropriate, Effective, Safe, Accessible Tizanidine 4mg Ap60mpriate, Effective, Safe, Accessible Prednisolone 50mg PR48mhis helps the best but he knows it's not good for him and takes rarely) Appropriate, Effective, Safe, Accessible -Medications previously tried: NSAID's  -Pain  Scale If pain is  3-4/10, will take APAP and bring to 1-2/10. If 8-9/10, will take Tizanidine and bring to 1-2/10.  Aggravating Factors: Lack of sleep, movement Pain Type: musculo-skeletal  -Recommended to continue current medication    Patient Goals/Self-Care Activities Patient will:  - take medications as prescribed  Follow Up Plan: The patient has been provided with contact information for the care management team and has been advised to call with any health related questions or concerns.   Mark Mejia, Pharm.Mark Mejia - [phone]  CPP F/U August 2023     Patient Care Plan: RN care manager plan of care     Problem Identified: No plan of care established for management of chronic disease states ( chronic pain, CHF, Hypertension, HLD)   Priority: High     Long-Range Goal: Development of plan of care for chronic disease management (Chronic pain, CHF, hypertension, HLD)   Start Date: 02/17/2021  Expected End Date: 02/17/2022  Priority: High  Note:   Current Barriers:  Chronic Disease Management support and education needs related to CHF, HTN, HLD, and chronic pain No advanced directives. Reports he has an advanced directive but it needs to be updated.  02/17/2021 Patient reports that he is taking his medications as prescribed. Reports he is considering changing back to the [facility] mail in pharmacy. Patient reports that he is not taking his Lasix because he thought he made him swell. Reports that he is doing to restart this medication. Reports that he is not monitoring his weight or blood pressure. Patient reports chronic pain and take medications. Reports that he is not able to take the muscle relaxer except at night because it makes him sleepy.  Reports he is cancer free. 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 This was stretched 13 months ago. 03/24/2021  Patient reports that he has a cold. States that his wife and son turn down the heat  at night. Reports that he has had congestion for 1 day.  Reports that he has not had an exposure to covid.  Reports he has switched back to [facility] mail order pharmacy.    Heart failure- reports weighing daily. Range 206-208.  Reports he restarted taking his lasix. Reports decrease swelling in his legs and feet. Hypertension-  reports that he is self monitoring daily.  BP range of 119-145/68-77.  Reports that he is taking his medications as prescribed.  Pain- reports pain of 3/10 today. Reports pain worse in the mornings. Reports taking his medications as prescribed. Denies any new problems or concerns today.  RNCM Clinical Goal(s):  Patient will verbalize understanding of plan for management of CHF, HTN, HLD, and Chronic pain as evidenced by self reporting of improve health take all medications exactly as prescribed and will call provider for medication related questions as evidenced by patient report    attend all scheduled medical appointments: including PCP and specialist as evidenced by patient report.        collaborate with the care management team towards completion of advanced directives by patient as evidenced by verbalizing completion  through collaboration with RN Care manager, provider, and care team.   Interventions: 1:1 collaboration with primary care provider regarding development and update of comprehensive plan of care as evidenced by provider attestation and co-signature Inter-disciplinary care team collaboration (see longitudinal plan of care) Evaluation of current treatment plan related to  self management and patient's adherence to plan as established by provider   Heart Failure Interventions:  (  Status: Goal on Track (progressing): YES.)  Long Term Goal  Reviewed Heart Failure Action Plan in depth and provided written copy Provided education about placing scale on hard, flat surface Advised patient to weigh each morning after emptying bladder Discussed importance of daily weight  and advised patient to weigh and record daily Reviewed role of diuretics in prevention of fluid overload and management of heart failure Discussed the importance of keeping all appointments with provider Encouraged patient to continue to weigh daily. Reviewed weight ranges.   Hyperlipidemia:  (Status: Goal on Track (progressing): YES.) Long Term Goal  Lab Results  Component Value Date   CHOL 94 (L) 01/02/2021   HDL 24 (L) 01/02/2021   LDLCALC 52 01/02/2021   TRIG 89 01/02/2021   CHOLHDL 3.9 01/02/2021     Medication review performed; medication list updated in electronic medical record.  Provider established cholesterol goals reviewed; Reviewed role and benefits of statin for ASCVD risk reduction;  Hypertension: (Status: New goal.) Long Term Goal Last practice recorded BP readings:  BP Readings from Last 3 Encounters:  02/06/21 128/78  01/02/21 108/70  11/26/20 136/84  Most recent eGFR/CrCl:  Lab Results  Component Value Date   EGFR 69 01/02/2021    No components found for: CRCL  Evaluation of current treatment plan related to hypertension self management and patient's adherence to plan as established by provider;   Reviewed prescribed diet low salt diet. Reviewed using dash Reviewed medications with patient and discussed importance of compliance;  Advised patient, providing education and rationale, to monitor blood pressure daily and record, calling PCP for findings outside established parameters;  Reviewed scheduled/upcoming provider appointments including:   Pain:  (Status: New goal.) Long Term Goal  Today's Vitals   03/24/21 1452  PainSc: 3     Pain assessment performed Medications reviewed Discussed importance of adherence to all scheduled medical appointments; Reviewed with patient prescribed pharmacological and nonpharmacological pain relief strategies; Encouraged patient to continue to take his medications as prescribed. Reviewed with patient that he switched  back to [facility] mail order pharmacy.  Patient Goals/Self-Care Activities: Take medications as prescribed   Attend all scheduled provider appointments Call pharmacy for medication refills 3-7 days in advance of running out of medications call office if I gain more than 2 pounds in one day or 5 pounds in one week track weight in diary use salt in moderation watch for swelling in feet, ankles and legs every day weigh myself daily follow rescue plan if symptoms flare-up track symptoms and what helps feel better or worse check blood pressure daily choose a place to take my blood pressure (home, clinic or office, retail store) write blood pressure results in a log or diary keep a blood pressure log take blood pressure log to all doctor appointments call doctor for signs and symptoms of high blood pressure develop an action plan for high blood pressure keep all doctor appointments take medications for blood pressure exactly as prescribed report new symptoms to your doctor        Mark Mejia was given information about Chronic Care Management services today including:  CCM service includes personalized support from designated clinical staff supervised by his physician, including individualized plan of care and coordination with other care providers 24/7 contact phone numbers for assistance for urgent and routine care needs. Standard insurance, coinsurance, copays and deductibles apply for chronic care management only during months in which we provide at least 20 minutes of these services. Most insurances cover these services  at 100%, however patients may be responsible for any copay, coinsurance and/or deductible if applicable. This service may help you avoid the need for more expensive face-to-face services. Only one practitioner may furnish and bill the service in a calendar month. The patient may stop CCM services at any time (effective at the end of the month) by phone call to the office  staff.  Patient agreed to services and verbal consent obtained.   The patient verbalized understanding of instructions, educational materials, and care plan provided today and declined offer to receive copy of patient instructions, educational materials, and care plan.  The pharmacy team will reach out to the patient again over the next 30 days.   Lane Hacker, [place]

## 2021-04-28 DIAGNOSIS — I10 Essential (primary) hypertension: Secondary | ICD-10-CM

## 2021-04-28 DIAGNOSIS — E782 Mixed hyperlipidemia: Secondary | ICD-10-CM

## 2021-04-29 ENCOUNTER — Other Ambulatory Visit: Payer: Self-pay | Admitting: Family Medicine

## 2021-04-29 ENCOUNTER — Other Ambulatory Visit (HOSPITAL_COMMUNITY): Payer: Self-pay

## 2021-04-29 ENCOUNTER — Other Ambulatory Visit: Payer: Self-pay

## 2021-04-29 DIAGNOSIS — N401 Enlarged prostate with lower urinary tract symptoms: Secondary | ICD-10-CM

## 2021-04-29 MED ORDER — TIZANIDINE HCL 4 MG PO TABS: 4.0000 mg | ORAL_TABLET | Freq: Four times a day (QID) | ORAL | 1 refills | Status: DC | PRN

## 2021-04-29 MED ORDER — LEVOTHYROXINE SODIUM 75 MCG PO TABS: 75.0000 ug | ORAL_TABLET | Freq: Every day | ORAL | 1 refills | Status: DC

## 2021-04-29 MED ORDER — AMLODIPINE BESYLATE 5 MG PO TABS: 5.0000 mg | ORAL_TABLET | Freq: Every day | ORAL | 1 refills | Status: DC

## 2021-04-29 MED ORDER — MELOXICAM 7.5 MG PO TABS: ORAL_TABLET | ORAL | 1 refills | Status: DC

## 2021-04-29 MED ORDER — OMEPRAZOLE 40 MG PO CPDR
40.0000 mg | DELAYED_RELEASE_CAPSULE | Freq: Every day | ORAL | 1 refills | Status: DC
  Filled 2021-04-29: qty 90, 90d supply, fill #0

## 2021-04-29 MED ORDER — HYDROCODONE-ACETAMINOPHEN 5-325 MG PO TABS: 1.0000 | ORAL_TABLET | Freq: Four times a day (QID) | ORAL | 0 refills | Status: DC | PRN

## 2021-04-29 MED ORDER — ROSUVASTATIN CALCIUM 20 MG PO TABS
20.0000 mg | ORAL_TABLET | Freq: Every day | ORAL | 2 refills | Status: DC
Start: 2021-04-29 — End: 2021-12-07

## 2021-04-29 MED ORDER — NITROGLYCERIN 0.4 MG SL SUBL
0.4000 mg | SUBLINGUAL_TABLET | SUBLINGUAL | 1 refills | Status: DC | PRN
Start: 2021-04-29 — End: 2021-05-07
  Filled 2021-04-29: qty 25, 5d supply, fill #0

## 2021-04-29 MED ORDER — TESTOSTERONE CYPIONATE 100 MG/ML IM SOLN: 200.0000 mg | INTRAMUSCULAR | 2 refills | Status: DC

## 2021-04-29 NOTE — Telephone Encounter (Signed)
Refill sent to pharmacy.   

## 2021-04-30 ENCOUNTER — Other Ambulatory Visit: Payer: Self-pay

## 2021-04-30 DIAGNOSIS — N401 Enlarged prostate with lower urinary tract symptoms: Secondary | ICD-10-CM

## 2021-04-30 MED ORDER — TAMSULOSIN HCL 0.4 MG PO CAPS: 0.8000 mg | ORAL_CAPSULE | Freq: Every day | ORAL | 0 refills | Status: DC

## 2021-05-01 ENCOUNTER — Telehealth: Payer: Self-pay | Admitting: Cardiology

## 2021-05-01 ENCOUNTER — Other Ambulatory Visit (HOSPITAL_COMMUNITY): Payer: Self-pay

## 2021-05-01 NOTE — Telephone Encounter (Signed)
?*  STAT* If patient is at the pharmacy, call can be transferred to refill team. ? ? ?1. Which medications need to be refilled? (please list name of each medication and dose if known)new prescriptions for  Nitroglycerin,  Omeprazole- changing pharmacy ? ?2. Which pharmacy/location (including street and city if local pharmacy) is medication to be sent to? [facility] ? ?3. Do they need a 30 day or 90 day supply? 90 days and refills ? ?

## 2021-05-07 ENCOUNTER — Other Ambulatory Visit: Payer: Self-pay

## 2021-05-07 MED ORDER — NITROGLYCERIN 0.4 MG SL SUBL: 0.4000 mg | SUBLINGUAL_TABLET | SUBLINGUAL | 1 refills | Status: DC | PRN

## 2021-05-07 MED ORDER — OMEPRAZOLE 40 MG PO CPDR: 40.0000 mg | DELAYED_RELEASE_CAPSULE | Freq: Every day | ORAL | 1 refills | Status: DC

## 2021-05-07 NOTE — Telephone Encounter (Signed)
Sent the [employer] and Omeprazole prescription to the [facility]. Per DPR left message on patients phone informing him of this. ?

## 2021-05-13 ENCOUNTER — Telehealth: Payer: Self-pay

## 2021-05-14 NOTE — Telephone Encounter (Signed)
Spoke with the patient, aware of the following recommendation per Dr. Agustin Cree, He verbalized understanding. He stated he does not take Nitroglycerin but is fully aware he should not combine with. I advise the patient in the events of chest pain sx's, if he has taken Sildenafil, he might want to call 911 or go to the nearest emergence Dept. But if he is not on Sildenafil it's safe to take the Nitroglycerin. Patient verbalized understanding.  ?

## 2021-05-19 ENCOUNTER — Telehealth: Payer: Medicare PPO

## 2021-05-19 NOTE — Telephone Encounter (Signed)
Clarification fax to the pharmacy.  ?

## 2021-06-09 ENCOUNTER — Ambulatory Visit (INDEPENDENT_AMBULATORY_CARE_PROVIDER_SITE_OTHER): Payer: Medicare PPO

## 2021-06-09 DIAGNOSIS — I10 Essential (primary) hypertension: Secondary | ICD-10-CM

## 2021-06-09 DIAGNOSIS — I110 Hypertensive heart disease with heart failure: Secondary | ICD-10-CM

## 2021-06-09 NOTE — Chronic Care Management (AMB) (Signed)
?Chronic Care Management  ? ?CCM RN Visit Note ? ?06/09/2021 ?Name: Mark Mejia MRN: [id number] DOB: [birth year] ? ?Subjective: ?Mark Mejia is a 66 y.o. year old male who is a primary care patient of Cox, Kirsten, MD. The care management team was consulted for assistance with disease management and care coordination needs.   ? ?Engaged with patient by telephone for follow up visit in response to provider referral for case management and/or care coordination services.  ? ?Consent to Services:  ?The patient was given information about Chronic Care Management services, agreed to services, and gave verbal consent prior to initiation of services.  Please see initial visit note for detailed documentation.  ? ?Patient agreed to services and verbal consent obtained.  ? ?Assessment: Review of patient past medical history, allergies, medications, health status, including review of consultants reports, laboratory and other test data, was performed as part of comprehensive evaluation and provision of chronic care management services.  ? ?SDOH (Social Determinants of Health) assessments and interventions performed:   ? ?CCM Care Plan ? ?No Known Allergies ? ?Outpatient Encounter Medications as of 06/09/2021  ?Medication Sig  ? amLODipine (NORVASC) 5 MG tablet Take 1 tablet (5 mg total) by mouth daily.  ? aspirin EC 81 MG tablet Take 81 mg by mouth daily.   ? dorzolamide (TRUSOPT) 2 % ophthalmic solution Place 1 drop into the right eye 2 (two) times daily.  ? furosemide (LASIX) 20 MG tablet TAKE 1 TABLET EVERY DAY  ? Glycerin-Hypromellose-PEG 400 ([employer] DRY EYE RELIEF OP) Place 2 drops into both eyes daily as needed (for dry eyes).  ? HYDROcodone-acetaminophen (NORCO/VICODIN) 5-325 MG tablet Take 1 tablet by mouth every 6 (six) hours as needed for moderate pain.  ? latanoprost (XALATAN) 0.005 % ophthalmic solution Place 1 drop into the right eye at bedtime.  ? levothyroxine (SYNTHROID) 75 MCG tablet Take 1 tablet (75 mcg total) by  mouth daily before breakfast.  ? meloxicam (MOBIC) 7.5 MG tablet TAKE 1 TABLET (7.5 MG TOTAL) BY MOUTH 2 (TWO) TIMES DAILY.  ? metoprolol tartrate (LOPRESSOR) 25 MG tablet Take 1 tablet (25 mg total) by mouth 2 (two) times daily.  ? nitroGLYCERIN (NITROSTAT) 0.4 MG SL tablet Place 1 tablet (0.4 mg total) under the tongue every 5 (five) minutes as needed for chest pain.  ? omeprazole (PRILOSEC) 40 MG capsule Take 1 capsule by mouth daily.  ? PREDNISOLONE PO Take 50 mg by mouth as needed (Leg pain).  ? rosuvastatin (CRESTOR) 20 MG tablet Take 1 tablet (20 mg total) by mouth daily.  ? tamsulosin (FLOMAX) 0.4 MG CAPS capsule Take 2 capsules (0.8 mg total) by mouth daily.  ? testosterone cypionate (DEPOTESTOTERONE CYPIONATE) 100 MG/ML injection Inject 2 mLs (200 mg total) into the muscle every 14 (fourteen) days. For IM use only  ? tiZANidine (ZANAFLEX) 4 MG tablet Take 1 tablet (4 mg total) by mouth every 6 (six) hours as needed for muscle spasms.  ? Vitamin D, Ergocalciferol, (DRISDOL) 1.25 MG (50000 UNIT) CAPS capsule Take 1 capsule (50,000 Units total) by mouth every Sunday.  ? ?No facility-administered encounter medications on file as of 06/09/2021.  ? ? ?Patient Active Problem List  ? Diagnosis Date Noted  ? 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 Aortic atherosclerosis ([place]) 01/02/2021  ? Atherosclerosis of native artery of both lower extremities with intermittent claudication ([place]) 01/02/2021  ? Apnea 11/26/2020  ? Snoring 11/26/2020  ? Somnolence, daytime 11/26/2020  ?  Lumbar back pain 11/26/2020  ? Other fatigue 11/26/2020  ? Need for immunization against influenza 11/26/2020  ? Overweight (BMI 25.0-29.9) 11/26/2020  ? CAD (coronary artery disease)   ? GERD (gastroesophageal reflux disease)   ? Hepatitis C   ? History of esophageal cancer   ? Testicular hypofunction   ? Vitamin D deficiency   ? Mixed hyperlipidemia 06/14/2019  ? 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/2019  ? Male  hypogonadism 06/14/2019  ? Benign prostatic hyperplasia with nocturia 06/14/2019  ? 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 Chest pain of uncertain etiology 37/62/8315  ? Angina pectoris ([place]) 05/25/2017  ? Peripheral vascular disease, asymptomatic ([place]) 05/23/2017  ? Hypertrophic scar of skin 09/29/2016  ? Encounter for annual wellness exam in Medicare patient 03/10/2016  ? Encounter for adjustment and management of vascular access device 03/10/2016  ? 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  ? Precordial chest pain 12/26/2015  ? Dyspnea on exertion 12/26/2015  ? ? ?Conditions to be addressed/monitored:CHF, HTN, HLD, and Chronic Pain ? ?Care Plan : RN care manager plan of care  ?Updates made by Luretha Rued, RN since 06/09/2021 12:00 AM  ?  ? ?Problem: No plan of care established for management of chronic disease states ( chronic pain, CHF, Hypertension, HLD)   ?Priority: High  ?  ? ?Long-Range Goal: Development of plan of care for chronic disease management (Chronic pain, CHF, hypertension, HLD)   ?Start Date: 02/17/2021  ?Expected End Date: 02/17/2022  ?Priority: High  ?Note:   ?Current Barriers:  ?Chronic Disease Management support and education needs related to CHF, HTN, HLD, and chronic pain ?No advanced directives. Reports he has an advanced directive but it needs to be updated.  ?02/17/2021 Patient reports that he is taking his medications as prescribed. Reports he is considering changing back to the [facility] mail in pharmacy. Patient reports that he is not taking his Lasix because he thought he made him swell. Reports that he is doing to restart this medication. Reports that he is not monitoring his weight or blood pressure. Patient reports chronic pain and take medications. Reports that he is not able to take the muscle relaxer except at night because it makes him sleepy.  Reports he is cancer free. States that he has to have his esophagus stretched  every 3 years due to scar tissue. This was stretched 13 months ago. ?03/24/2021  Patient reports that he has a cold. States that his wife and son turn down the heat at night. Reports that he has had congestion for 1 day.  Reports that he has not had an exposure to covid.  Reports he has switched back to [facility] mail order pharmacy.    Heart failure- reports weighing daily. Range 206-208.  Reports he restarted taking his lasix. Reports decrease swelling in his legs and feet. Hypertension-  reports that he is self monitoring daily.  BP range of 119-145/68-77.  Reports that he is taking his medications as prescribed.  Pain- reports pain of 3/10 today. Reports pain worse in the mornings. Reports taking his medications as prescribed. Denies any new problems or concerns today.  ?06/09/21 Patient reports leg discomfort. He states "legs not really swelling, it is just tightness". He reports when he is on his feet for long periods this happens and states he had a lot of company for the Holiday this past weekend. He states, "It is better today". He reports he took two Prednisolone on Saturday  and one on Sunday. He reports he needs a refill. He also interested in getting a wheelchair, adding it would be easier for he and his family when they go on outings. RNCM encouraged patient to notify provider to request a hand written prescription or fax prescription to DME company of choice. Continues to weigh self daily. Weight today 209 pounds; weight yesterday 208.9. He denies signs/symptoms of heart failure exacerbation. He reports BP 137/78 today; 06/08/21 BP 147/87; 06/07/21 BP 148/84.  ?RNCM Clinical Goal(s):  ?Patient will verbalize understanding of plan for management of CHF, HTN, HLD, and Chronic pain as evidenced by self reporting of improve health ?take all medications exactly as prescribed and will call provider for medication related questions as evidenced by patient report    ?attend all scheduled medical appointments:  including PCP and specialist as evidenced by patient report.        ?collaborate with the care management team towards completion of advanced directives by patient as evidenced by verbalizing completion  through coll

## 2021-06-09 NOTE — Patient Instructions (Addendum)
Visit Information ? ?Thank you for taking time to visit with me today. Please don't hesitate to contact me if I can be of assistance to you before our next scheduled telephone appointment. ? ?Following are the goals we discussed today:  ?Patient Goals/Self-Care Activities: ?Contact provider for if your condition worsens or does not improve ?Take medications as prescribed   ?Attend all scheduled provider appointments ?Call pharmacy or provider for medication refills 3-7 days in advance of running out of medications ?call office if I gain more than 3 pounds in a 24 hour period or 5 pounds in one week ?track weight in diary ?use salt in moderation ?watch for swelling in feet, ankles and legs every day ?weigh myself daily ?follow rescue plan if symptoms flare-up ?track symptoms and what helps feel better or worse ?check blood pressure daily ?choose a place to take my blood pressure (home, clinic or office, retail store) ?write blood pressure results in a log or diary ?keep a blood pressure log ?take blood pressure log to all doctor appointments ?call doctor for signs and symptoms of high blood pressure ?develop an action plan for high blood pressure ?keep all doctor appointments ?take medications for blood pressure exactly as prescribed ?report new symptoms to your doctor ? ?Our next appointment is by telephone on 07/16/21 at 1:30 pm. ? ?Please call the care guide team at [phone] if you need to cancel or reschedule your appointment.  ? ?If you are experiencing a Mental Health or [place] or need someone to talk to, please call the Suicide and Crisis Lifeline: 988 ?call 1-800-273-TALK (toll free, 24 hour hotline)  ? ?Patient verbalizes understanding of instructions and care plan provided today and agrees to view in [place]. Active MyChart status confirmed with patient.   ? ?Covering RNCM: ?Thea Silversmith, RN, MSN, BSN, CCM ?Care Management Coordinator ?[phone]  ?

## 2021-06-15 ENCOUNTER — Other Ambulatory Visit: Payer: Self-pay | Admitting: Family Medicine

## 2021-06-15 ENCOUNTER — Telehealth: Payer: Self-pay

## 2021-06-15 ENCOUNTER — Encounter: Payer: Self-pay | Admitting: Family Medicine

## 2021-06-15 MED ORDER — FUROSEMIDE 20 MG PO TABS: 20.0000 mg | ORAL_TABLET | Freq: Every day | ORAL | 1 refills | Status: DC

## 2021-06-15 MED ORDER — PREDNISONE 50 MG PO TABS
ORAL_TABLET | ORAL | 0 refills | Status: DC
Start: 2021-06-15 — End: 2021-08-04

## 2021-06-15 NOTE — Telephone Encounter (Signed)
Patient is requesting prednisone refill.  ? ?Attempted to pend pednisolone order, but cannot due to requesting an alternative. Please advise. Would like this sent to [place].  ? ?Harrell Lark 06/15/21 9:57 AM ? ? ?

## 2021-06-15 NOTE — Telephone Encounter (Signed)
Patient reports that this was prescribed about 1 year ago for leg pain.  She is requesting that this be sent to his mail order pharmacy for short term use.   ?

## 2021-06-28 DIAGNOSIS — I5030 Unspecified diastolic (congestive) heart failure: Secondary | ICD-10-CM

## 2021-06-28 DIAGNOSIS — E785 Hyperlipidemia, unspecified: Secondary | ICD-10-CM

## 2021-06-28 DIAGNOSIS — I110 Hypertensive heart disease with heart failure: Secondary | ICD-10-CM

## 2021-07-01 ENCOUNTER — Other Ambulatory Visit: Payer: Self-pay | Admitting: Family Medicine

## 2021-07-03 ENCOUNTER — Ambulatory Visit: Payer: Medicare PPO | Admitting: Family Medicine

## 2021-07-16 ENCOUNTER — Ambulatory Visit (INDEPENDENT_AMBULATORY_CARE_PROVIDER_SITE_OTHER): Payer: Medicare PPO | Admitting: *Deleted

## 2021-07-16 DIAGNOSIS — G8929 Other chronic pain: Secondary | ICD-10-CM

## 2021-07-16 DIAGNOSIS — I5030 Unspecified diastolic (congestive) heart failure: Secondary | ICD-10-CM

## 2021-07-16 DIAGNOSIS — I10 Essential (primary) hypertension: Secondary | ICD-10-CM

## 2021-07-16 NOTE — Chronic Care Management (AMB) (Signed)
Chronic Care Management   CCM RN Visit Note  07/16/2021 Name: Mark Mejia MRN: [id number] DOB: 03-24-1955  Subjective: Mark Mejia is a 66 y.o. year old male who is a primary care patient of Cox, Kirsten, MD. The care management team was consulted for assistance with disease management and care coordination needs.    Engaged with patient by telephone for follow up visit in response to provider referral for case management and/or care coordination services.   Consent to Services:  The patient was given information about Chronic Care Management services, agreed to services, and gave verbal consent prior to initiation of services.  Please see initial visit note for detailed documentation.   Patient agreed to services and verbal consent obtained.   Assessment: Review of patient past medical history, allergies, medications, health status, including review of consultants reports, laboratory and other test data, was performed as part of comprehensive evaluation and provision of chronic care management services.   SDOH (Social Determinants of Health) assessments and interventions performed:    CCM Care Plan  No Known Allergies  Outpatient Encounter Medications as of 07/16/2021  Medication Sig   amLODipine (NORVASC) 5 MG tablet Take 1 tablet (5 mg total) by mouth daily.   aspirin EC 81 MG tablet Take 81 mg by mouth daily.    dorzolamide (TRUSOPT) 2 % ophthalmic solution Place 1 drop into the right eye 2 (two) times daily.   furosemide (LASIX) 20 MG tablet Take 1 tablet (20 mg total) by mouth daily.   Glycerin-Hypromellose-PEG 400 ([facility] DRY EYE RELIEF OP) Place 2 drops into both eyes daily as needed (for dry eyes).   HYDROcodone-acetaminophen (NORCO/VICODIN) 5-325 MG tablet Take 1 tablet by mouth every 6 (six) hours as needed for moderate pain.   latanoprost (XALATAN) 0.005 % ophthalmic solution Place 1 drop into the right eye at bedtime.   levothyroxine (SYNTHROID) 75 MCG tablet Take 1  tablet (75 mcg total) by mouth daily before breakfast.   meloxicam (MOBIC) 7.5 MG tablet TAKE 1 TABLET (7.5 MG TOTAL) BY MOUTH 2 (TWO) TIMES DAILY.   metoprolol tartrate (LOPRESSOR) 25 MG tablet Take 1 tablet (25 mg total) by mouth 2 (two) times daily.   nitroGLYCERIN (NITROSTAT) 0.4 MG SL tablet Place 1 tablet (0.4 mg total) under the tongue every 5 (five) minutes as needed for chest pain.   omeprazole (PRILOSEC) 40 MG capsule Take 1 capsule by mouth daily.   predniSONE (DELTASONE) 50 MG tablet Once daily x 5 days.   rosuvastatin (CRESTOR) 20 MG tablet Take 1 tablet (20 mg total) by mouth daily.   tamsulosin (FLOMAX) 0.4 MG CAPS capsule Take 2 capsules (0.8 mg total) by mouth daily.   testosterone cypionate (DEPOTESTOTERONE CYPIONATE) 100 MG/ML injection Inject 2 mLs (200 mg total) into the muscle every 14 (fourteen) days. For IM use only   tiZANidine (ZANAFLEX) 4 MG tablet Take 1 tablet (4 mg total) by mouth every 6 (six) hours as needed for muscle spasms.   Vitamin D, Ergocalciferol, (DRISDOL) 1.25 MG (50000 UNIT) CAPS capsule Take 1 capsule (50,000 Units total) by mouth every Sunday.   No facility-administered encounter medications on file as of 07/16/2021.    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 ([place]) 01/02/2021   Aortic atherosclerosis ([place]) 01/02/2021   Atherosclerosis of native artery of both lower extremities with intermittent claudication ([place]) 01/02/2021   Apnea 11/26/2020   Snoring 11/26/2020   Somnolence, daytime  11/26/2020   Lumbar back pain 11/26/2020   Other fatigue 11/26/2020   Need for immunization against influenza 11/26/2020   Overweight (BMI 25.0-29.9) 11/26/2020   CAD (coronary artery disease)    GERD (gastroesophageal reflux disease)    Hepatitis C    History of esophageal cancer    Testicular hypofunction    Vitamin D deficiency    Mixed hyperlipidemia 06/14/2019   Atrophy of thyroid  06/14/2019   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   Benign prostatic hyperplasia with nocturia 06/14/2019   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[place]) 06/14/2019   Chest pain of uncertain etiology 18/29/9371   Angina pectoris ([place]) 05/25/2017   Peripheral vascular disease, asymptomatic ([place]) 05/23/2017   Hypertrophic scar of skin 09/29/2016   Encounter for annual wellness exam in Medicare patient 03/10/2016   Encounter for adjustment and management of vascular access device 03/10/2016   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   Precordial chest pain 12/26/2015   Dyspnea on exertion 12/26/2015    Conditions to be addressed/monitored:CHF and HTN  Care Plan : RN care manager plan of care  Updates made by Leona Singleton, RN since 07/16/2021 12:00 AM     Problem: No plan of care established for management of chronic disease states ( chronic pain, CHF, Hypertension, HLD)   Priority: High     Long-Range Goal: Development of plan of care for chronic disease management (Chronic pain, CHF, hypertension, HLD)   Start Date: 02/17/2021  Expected End Date: 02/17/2022  Priority: High  Note:   Current Barriers:  Chronic Disease Management support and education needs related to CHF, HTN, HLD, and chronic pain No advanced directives. Reports he has an advanced directive but it needs to be updated.  02/17/2021 Patient reports that he is taking his medications as prescribed. Reports he is considering changing back to the [facility] mail in pharmacy. Patient reports that he is not taking his Lasix because he thought he made him swell. Reports that he is doing to restart this medication. Reports that he is not monitoring his weight or blood pressure. Patient reports chronic pain and take medications. Reports that he is not able to take the muscle relaxer except at night because it makes him sleepy.  Reports he is cancer free. States that he has to have his esophagus stretched  every 3 years due to scar tissue. This was stretched 13 months ago. 03/24/2021  Patient reports that he has a cold. States that his wife and son turn down the heat at night. Reports that he has had congestion for 1 day.  Reports that he has not had an exposure to covid.  Reports he has switched back to [facility] mail order pharmacy.    Heart failure- reports weighing daily. Range 206-208.  Reports he restarted taking his lasix. Reports decrease swelling in his legs and feet. Hypertension-  reports that he is self monitoring daily.  BP range of 119-145/68-77.  Reports that he is taking his medications as prescribed.  Pain- reports pain of 3/10 today. Reports pain worse in the mornings. Reports taking his medications as prescribed. Denies any new problems or concerns today.  06/09/21 Patient reports leg discomfort. He states "legs not really swelling, it is just tightness". He reports when he is on his feet for long periods this happens and states he had a lot of company for the Holiday this past weekend. He states, "It is better today". He reports he took two Prednisolone on Saturday  and one on Sunday. He reports he needs a refill. He also interested in getting a wheelchair, adding it would be easier for he and his family when they go on outings. RNCM encouraged patient to notify provider to request a hand written prescription or fax prescription to DME company of choice. Continues to weigh self daily. Weight today 209 pounds; weight yesterday 208.9. He denies signs/symptoms of heart failure exacerbation. He reports BP 137/78 today; 06/08/21 BP 147/87; 06/07/21 BP 148/84.  5/18--states he feels well; denies any chest pain, swelling, light headiness or dizziness.  Weight 202.6 (reports intentional weight loss.)  BP 130/79 with recent ranges of 130/70's.  Still needsto obtain wheelchair.  Encouraged to ask PCP for prescription at next appointment.  Also stating wife requesting evaluation for dementia; encouraged to discuss  with PCP RNCM Clinical Goal(s):  Patient will verbalize understanding of plan for management of CHF, HTN, HLD, and Chronic pain as evidenced by self reporting of improve health take all medications exactly as prescribed and will call provider for medication related questions as evidenced by patient report    attend all scheduled medical appointments: including PCP and specialist as evidenced by patient report.        collaborate with the care management team towards completion of advanced directives by patient as evidenced by verbalizing completion  through collaboration with RN Care manager, provider, and care team.   Interventions: 1:1 collaboration with primary care provider regarding development and update of comprehensive plan of care as evidenced by provider attestation and co-signature Inter-disciplinary care team collaboration (see longitudinal plan of care) Evaluation of current treatment plan related to  self management and patient's adherence to plan as established by provider  Heart Failure Interventions:  (Status: Goal on Track (progressing): YES.)  Long Term Goal  Discussed importance of daily weight and advised patient to weigh and record daily Discussed the importance of keeping all appointments with provider Encouraged patient to continue to weigh daily. Discussed weight parameter when to call the doctor, increase 3 pounds in 24 hour, 5 pounds in a week. Patient voiced understanding  Reviewed signs/symptoms of Heart Failure exacerbation Encouraged to monitor salt intake Congratulated on weight loss  Hyperlipidemia:  (Status: Goal on Track (progressing): YES.) Long Term Goal  Lab Results  Component Value Date   CHOL 94 (L) 01/02/2021   HDL 24 (L) 01/02/2021   LDLCALC 52 01/02/2021   TRIG 89 01/02/2021   CHOLHDL 3.9 01/02/2021     Reviewed Medications and encouraged to continue to take as prescribed  Encouraged to continue to attend provider appointment as  scheduled  Hypertension: (Status: Goal on Track (progressing): YES.) Long Term Goal Home BP readings: 06/09/21 BP 137/78  06/08/21 BP 147/87 06/07/21 BP 148/84  Last practice recorded BP readings:  BP Readings from Last 3 Encounters:  03/05/21 126/70  02/06/21 128/78  01/02/21 108/70  Most recent eGFR/CrCl:  Lab Results  Component Value Date   EGFR 69 01/02/2021    No components found for: CRCL  Evaluation of current treatment plan related to hypertension self management and patient's adherence to plan as established by provider;   Reviewed medications with patient and discussed importance of compliance;  Reviewed scheduled/upcoming provider appointments including:  Encouraged to eat healthy and monitor salt intake  Pain:  (Status: New goal.) Long Term Goal  Today's Vitals   07/16/21 1416  PainSc: 0-No pain    Pain assessment performed Medications reviewed Discussed importance of adherence to all scheduled medical appointments; Reviewed  with patient prescribed pharmacological and nonpharmacological pain relief strategies; Encouraged patient to continue to take his medications as prescribed Encouraged patient to contact provider if condition worsens or does not improve  Patient Goals/Self-Care Activities: Contact provider for if your condition worsens or does not improve Take medications as prescribed   Attend all scheduled provider appointments Call pharmacy or provider for medication refills 3-7 days in advance of running out of medications call office if I gain more than 3 pounds in a 24 hour period or 5 pounds in one week track weight in diary use salt in moderation watch for swelling in feet, ankles and legs every day weigh myself daily follow rescue plan if symptoms flare-up track symptoms and what helps feel better or worse check blood pressure daily choose a place to take my blood pressure (home, clinic or office, retail store) write blood pressure results in a  log or diary keep a blood pressure log take blood pressure log to all doctor appointments call doctor for signs and symptoms of high blood pressure develop an action plan for high blood pressure keep all doctor appointments take medications for blood pressure exactly as prescribed report new symptoms to your doctor     Plan:The care management team will reach out to the patient again over the next 45 days.  Hubert Azure RN, MSN RN Care Management Coordinator  [phone] [email or web address]

## 2021-07-16 NOTE — Patient Instructions (Addendum)
Visit Information  Thank you for taking time to visit with me today. Please don't hesitate to contact me if I can be of assistance to you before our next scheduled telephone appointment.  Following are the goals we discussed today:  Contact provider for if your condition worsens or does not improve Take medications as prescribed   Attend all scheduled provider appointments Call pharmacy or provider for medication refills 3-7 days in advance of running out of medications call office if I gain more than 3 pounds in a 24 hour period or 5 pounds in one week track weight in diary use salt in moderation watch for swelling in feet, ankles and legs every day weigh myself daily follow rescue plan if symptoms flare-up track symptoms and what helps feel better or worse check blood pressure daily choose a place to take my blood pressure (home, clinic or office, retail store) write blood pressure results in a log or diary keep a blood pressure log take blood pressure log to all doctor appointments call doctor for signs and symptoms of high blood pressure develop an action plan for high blood pressure keep all doctor appointments take medications for blood pressure exactly as prescribed report new symptoms to your doctor  Our next appointment is by telephone on 6/27 at 1030  Please call the care guide team at [phone] if you need to cancel or reschedule your appointment.   If you are experiencing a Mental Health or [place] or need someone to talk to, please call the Suicide and Crisis Lifeline: 988 call the [place] National Suicide Prevention Lifeline: 320-562-4515 or TTY: 980-253-9808 TTY 662 868 4861) to talk to a trained counselor call 1-800-273-TALK (toll free, 24 hour hotline) call 911   Patient verbalizes understanding of instructions and care plan provided today and agrees to view in [place]. Active MyChart status and patient understanding of how to access  instructions and care plan via MyChart confirmed with patient.     Hubert Azure RN, MSN RN Care Management Coordinator  [phone] [email or web address]

## 2021-07-17 ENCOUNTER — Telehealth: Payer: Self-pay

## 2021-07-17 NOTE — Chronic Care Management (AMB) (Unsigned)
Chronic Care Management Pharmacy Assistant   Name: Mark Mejia  MRN: [id number] DOB: Feb 19, 1956   Reason for Encounter: Disease State/Hypertension  Recent office visits:  07-16-2021 Leona Singleton, RN (CCM)  06-09-2021 Luretha Rued, RN (CCM)  Recent consult visits:  None  Hospital visits:  None in previous 6 months  Medications: Outpatient Encounter Medications as of 07/17/2021  Medication Sig   amLODipine (NORVASC) 5 MG tablet Take 1 tablet (5 mg total) by mouth daily.   aspirin EC 81 MG tablet Take 81 mg by mouth daily.    dorzolamide (TRUSOPT) 2 % ophthalmic solution Place 1 drop into the right eye 2 (two) times daily.   furosemide (LASIX) 20 MG tablet Take 1 tablet (20 mg total) by mouth daily.   Glycerin-Hypromellose-PEG 400 ([facility] DRY EYE RELIEF OP) Place 2 drops into both eyes daily as needed (for dry eyes).   HYDROcodone-acetaminophen (NORCO/VICODIN) 5-325 MG tablet Take 1 tablet by mouth every 6 (six) hours as needed for moderate pain.   latanoprost (XALATAN) 0.005 % ophthalmic solution Place 1 drop into the right eye at bedtime.   levothyroxine (SYNTHROID) 75 MCG tablet Take 1 tablet (75 mcg total) by mouth daily before breakfast.   meloxicam (MOBIC) 7.5 MG tablet TAKE 1 TABLET (7.5 MG TOTAL) BY MOUTH 2 (TWO) TIMES DAILY.   metoprolol tartrate (LOPRESSOR) 25 MG tablet Take 1 tablet (25 mg total) by mouth 2 (two) times daily.   nitroGLYCERIN (NITROSTAT) 0.4 MG SL tablet Place 1 tablet (0.4 mg total) under the tongue every 5 (five) minutes as needed for chest pain.   omeprazole (PRILOSEC) 40 MG capsule Take 1 capsule by mouth daily.   predniSONE (DELTASONE) 50 MG tablet Once daily x 5 days.   rosuvastatin (CRESTOR) 20 MG tablet Take 1 tablet (20 mg total) by mouth daily.   tamsulosin (FLOMAX) 0.4 MG CAPS capsule Take 2 capsules (0.8 mg total) by mouth daily.   testosterone cypionate (DEPOTESTOTERONE CYPIONATE) 100 MG/ML injection Inject 2 mLs (200 mg total) into  the muscle every 14 (fourteen) days. For IM use only   tiZANidine (ZANAFLEX) 4 MG tablet Take 1 tablet (4 mg total) by mouth every 6 (six) hours as needed for muscle spasms.   Vitamin D, Ergocalciferol, (DRISDOL) 1.25 MG (50000 UNIT) CAPS capsule Take 1 capsule (50,000 Units total) by mouth every Sunday.   No facility-administered encounter medications on file as of 07/17/2021.   Recent Office Vitals: BP Readings from Last 3 Encounters:  03/05/21 126/70  02/06/21 128/78  01/02/21 108/70   Pulse Readings from Last 3 Encounters:  03/05/21 65  01/02/21 60  11/26/20 61    Wt Readings from Last 3 Encounters:  03/05/21 211 lb 3.2 oz (95.8 kg)  02/17/21 205 lb (93 kg)  01/29/21 220 lb (99.8 kg)     Kidney Function Lab Results  Component Value Date/Time   CREATININE 1.16 01/02/2021 09:56 AM   CREATININE 1.07 11/26/2020 12:07 PM   GFRNONAA 64 12/14/2019 10:50 AM   GFRAA 74 12/14/2019 10:50 AM       Latest Ref Rng & Units 01/02/2021    9:56 AM 11/26/2020   12:07 PM 07/03/2020    3:16 PM  BMP  Glucose 70 - 99 mg/dL 87   84   83    BUN 8 - 27 mg/dL '9   12   14    '$ Creatinine 0.76 - 1.27 mg/dL 1.16   1.07   1.04  BUN/Creat Ratio 10 - '24 8   11   13    '$ Sodium 134 - 144 mmol/L 139   142   141    Potassium 3.5 - 5.2 mmol/L 4.1   4.4   4.1    Chloride 96 - 106 mmol/L 102   103   105    CO2 20 - 29 mmol/L '25   26   21    '$ Calcium 8.6 - 10.2 mg/dL 9.4   9.8   9.9     07-17-2021: 1st attempt left VM 07-20-2021: 2nd attempt left VM 07-21-2021: 3rd attempt left VM   Star Rating Drugs: Rosuvastatin 20 mg- Last filled 07-11-2021 90 DS  [place] Clinical Pharmacist Assistant [phone]

## 2021-07-23 ENCOUNTER — Telehealth: Payer: Self-pay

## 2021-07-23 NOTE — Telephone Encounter (Signed)
Patient came into office for testosterone injection, however not scheduled. He states he has not received an injection in about a month or so and it was last given by our clinic.   Documentation supports last injection was January 02, 2021. Last testosterone levels checked over one year ago. Testosterone was last ordered April 29, 2021. With this script, medication would have been used by April 1,2023. Spoke with Maudie Mercury. Patient has upcoming appointment on 6/6.   Advised patient to wait for injection and possible testosterone labs on 6/6.   Royce Macadamia, [place] 07/23/21 10:57 AM

## 2021-07-29 DIAGNOSIS — I509 Heart failure, unspecified: Secondary | ICD-10-CM

## 2021-07-29 DIAGNOSIS — E785 Hyperlipidemia, unspecified: Secondary | ICD-10-CM

## 2021-07-29 DIAGNOSIS — I110 Hypertensive heart disease with heart failure: Secondary | ICD-10-CM

## 2021-08-04 ENCOUNTER — Ambulatory Visit (INDEPENDENT_AMBULATORY_CARE_PROVIDER_SITE_OTHER): Payer: Medicare PPO | Admitting: Family Medicine

## 2021-08-04 VITALS — BP 108/60 | HR 72 | Temp 97.3°F | Resp 18 | Ht 71.0 in | Wt 209.0 lb

## 2021-08-04 DIAGNOSIS — I5030 Unspecified diastolic (congestive) heart failure: Secondary | ICD-10-CM

## 2021-08-04 DIAGNOSIS — Z8501 Personal history of malignant neoplasm of esophagus: Secondary | ICD-10-CM | POA: Diagnosis not present

## 2021-08-04 DIAGNOSIS — R413 Other amnesia: Secondary | ICD-10-CM | POA: Diagnosis not present

## 2021-08-04 DIAGNOSIS — I209 Angina pectoris, unspecified: Secondary | ICD-10-CM

## 2021-08-04 DIAGNOSIS — F5101 Primary insomnia: Secondary | ICD-10-CM

## 2021-08-04 DIAGNOSIS — E782 Mixed hyperlipidemia: Secondary | ICD-10-CM

## 2021-08-04 DIAGNOSIS — E291 Testicular hypofunction: Secondary | ICD-10-CM | POA: Diagnosis not present

## 2021-08-04 DIAGNOSIS — I110 Hypertensive heart disease with heart failure: Secondary | ICD-10-CM

## 2021-08-04 DIAGNOSIS — E034 Atrophy of thyroid (acquired): Secondary | ICD-10-CM

## 2021-08-04 DIAGNOSIS — I70213 Atherosclerosis of native arteries of extremities with intermittent claudication, bilateral legs: Secondary | ICD-10-CM | POA: Diagnosis not present

## 2021-08-04 DIAGNOSIS — G4700 Insomnia, unspecified: Secondary | ICD-10-CM | POA: Insufficient documentation

## 2021-08-04 DIAGNOSIS — J301 Allergic rhinitis due to pollen: Secondary | ICD-10-CM

## 2021-08-04 DIAGNOSIS — I700 Atherosclerosis of aorta: Secondary | ICD-10-CM

## 2021-08-04 MED ORDER — ESZOPICLONE 2 MG PO TABS: 2.0000 mg | ORAL_TABLET | Freq: Every evening | ORAL | 2 refills | Status: DC | PRN

## 2021-08-04 NOTE — Patient Instructions (Signed)
Start Claritin 10 mg daily. Call Dr. Lyda Jester to set up an appointment. Start Lunesta 2 mg nightly for insomnia.

## 2021-08-04 NOTE — Progress Notes (Signed)
Subjective:  Patient ID: Mark Mejia, male    DOB: 16-Jul-1955  Age: 66 y.o. MRN: [id number]  Chief Complaint  Patient presents with   Hyperlipidemia   Hypertension   HPI:  Pt Is an African-American male who presents for follow-up of his hypertensive heart and chronic kidney disease, coronary artery disease, peripheral artery disease, GERD, testicular hypofunction, apnea, vitamin D deficiency.  Hypertension: His blood pressures well controlled 108/60 today.  Patient is currently on amlodipine 5 mg once daily.  Patient is on metoprolol tartrate 25 mg twice a day.  He has noticed since starting this he has significant dyspnea on exertion.  The patient has appointment with Dr. Agustin Cree on Monday and plans to discuss this.  Takes aspirin 81 mg once daily.  Patient has not been taking his Lasix.  He feels like when he takes it he swells all over.  Denies hives or swelling in his throat when he takes it.  Has taken any nitroglycerine.   Patient eats healthy.  Unable to exercise.  Hyperlipidemia: with aortic atherosclerosis noted on recent xray.  Currently on Crestor for 20 mg once daily.  GERD: Omeprazole 40 mg once daily.  Patient is constantly coughing and bringing up phlegm.  He spits up phlegm at nighttime.  He feels like the reflux medicine may not be working well enough.  Hypothyroidism: Currently on Synthroid 75 mcg once daily.  Last thyroid level was September.  TSH was 4.390.  BPH: Flomax 0.4 mg 2 daily at night.  Pt's nocturia initially dropped from 8 times per night to 3 times per night. It is increasing.   Patient has very poor sleep.  He did have a sleep study which was normal.  He sleeps approximately 1-1/2 to 3 hours in 24-hour.  Until he is so exhausted that he will just sleep for several hours in a row. Patient has not tried any sleep medicines than melatonin which did not help.  Vitamin D deficiency takes vitamin D 50,000 once weekly.  History of esophageal cancer: No  dysphagia, but area in throat feels funny and he is concerned about cancer site began last night. .  Hyperlipidemia Pertinent negatives include no chest pain, myalgias or shortness of breath.  Hypertension Pertinent negatives include no chest pain, headaches or shortness of breath.     Current Outpatient Medications on File Prior to Visit  Medication Sig Dispense Refill   amLODipine (NORVASC) 5 MG tablet Take 1 tablet (5 mg total) by mouth daily. 90 tablet 1   aspirin EC 81 MG tablet Take 81 mg by mouth daily.      dorzolamide (TRUSOPT) 2 % ophthalmic solution Place 1 drop into the right eye 2 (two) times daily.     Glycerin-Hypromellose-PEG 400 ([facility] DRY EYE RELIEF OP) Place 2 drops into both eyes daily as needed (for dry eyes).     HYDROcodone-acetaminophen (NORCO/VICODIN) 5-325 MG tablet Take 1 tablet by mouth every 6 (six) hours as needed for moderate pain. 30 tablet 0   latanoprost (XALATAN) 0.005 % ophthalmic solution Place 1 drop into the right eye at bedtime.     levothyroxine (SYNTHROID) 75 MCG tablet Take 1 tablet (75 mcg total) by mouth daily before breakfast. 90 tablet 1   meloxicam (MOBIC) 7.5 MG tablet TAKE 1 TABLET (7.5 MG TOTAL) BY MOUTH 2 (TWO) TIMES DAILY. 180 tablet 1   metoprolol tartrate (LOPRESSOR) 25 MG tablet Take 1 tablet (25 mg total) by mouth 2 (two) times daily. 180 tablet  3   nitroGLYCERIN (NITROSTAT) 0.4 MG SL tablet Place 1 tablet (0.4 mg total) under the tongue every 5 (five) minutes as needed for chest pain. 25 tablet 1   omeprazole (PRILOSEC) 40 MG capsule Take 1 capsule by mouth daily. 90 capsule 1   rosuvastatin (CRESTOR) 20 MG tablet Take 1 tablet (20 mg total) by mouth daily. 90 tablet 2   tamsulosin (FLOMAX) 0.4 MG CAPS capsule Take 2 capsules (0.8 mg total) by mouth daily. 180 capsule 0   testosterone cypionate (DEPOTESTOTERONE CYPIONATE) 100 MG/ML injection Inject 2 mLs (200 mg total) into the muscle every 14 (fourteen) days. For IM use only 10 mL 2    tiZANidine (ZANAFLEX) 4 MG tablet Take 1 tablet (4 mg total) by mouth every 6 (six) hours as needed for muscle spasms. 90 tablet 1   Vitamin D, Ergocalciferol, (DRISDOL) 1.25 MG (50000 UNIT) CAPS capsule Take 1 capsule (50,000 Units total) by mouth every Sunday. 12 capsule 1   No current facility-administered medications on file prior to visit.   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[place]) 06/14/2019   Hypertrophic scar of skin 09/29/2016   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   Mixed hyperlipidemia    Peripheral vascular disease, asymptomatic ([place]) 05/23/2017   80-99% stenosis in the left internal coronary artery   Precordial chest pain 12/26/2015   Shortness of breath 12/26/2015   Testicular hypofunction    Vitamin D deficiency    Past Surgical History:  Procedure Laterality Date   GASTROSTOMY W/ FEEDING TUBE     LEFT HEART CATH AND CORONARY ANGIOGRAPHY N/A 05/25/2017   Procedure: LEFT HEART CATH AND CORONARY ANGIOGRAPHY;  Surgeon: Martinique, Peter M, MD;  Location: [facility];  Service: Cardiovascular;  Laterality: N/A;   PORTACATH PLACEMENT     THROAT SURGERY      Family History  Problem Relation Age of Onset   Arthritis Mother    Hypertension Mother    Cancer - Other Maternal Grandmother    Social History   Socioeconomic History   Marital status: Married    Spouse name: [name]   Number of  children: 1   Years of education: college   Highest education level: Not on file  Occupational History   Occupation: Retired  Tobacco Use   Smoking status: Never   Smokeless tobacco: Never  Vaping Use   Vaping Use: Never used  Substance and Sexual Activity   Alcohol use: No   Drug use: No   Sexual activity: Not on file  Other Topics Concern   Not on file  Social History Narrative   Currently retired/disabled   Social Determinants of Health   Financial Resource Strain: Low Risk  (04/16/2021)   Overall Financial Resource Strain (CARDIA)    Difficulty of Paying Living Expenses: Not hard at all  Food Insecurity: No Food Insecurity (02/17/2021)   Hunger Vital Sign  Worried About Charity fundraiser in the Last Year: Never true    [place] in the Last Year: Never true  Transportation Needs: No Transportation Needs (04/16/2021)   PRAPARE - Hydrologist (Medical): No    Lack of Transportation (Non-Medical): No  Physical Activity: Not on file  Stress: Not on file  Social Connections: Not on file    Review of Systems  Constitutional:  Negative for chills, fatigue and fever.  HENT:  Positive for congestion. Negative for ear pain and sore throat.        Dry mouth.  Respiratory:  Negative for cough and shortness of breath.   Cardiovascular:  Negative for chest pain.  Gastrointestinal:  Negative for abdominal pain, constipation, diarrhea, nausea and vomiting.       No dysphagia, but area in throat feels funny and he is concerned about cancer site began last night. .  Endocrine: Negative for polydipsia, polyphagia and polyuria.  Genitourinary:  Negative for dysuria and frequency.  Musculoskeletal:  Negative for arthralgias and myalgias.  Neurological:  Negative for dizziness and headaches.  Psychiatric/Behavioral:  Negative for dysphoric mood.        No dysphoria     Objective:  BP 108/60   Pulse 72   Temp (!) 97.3 F (36.3 C)   Resp 18    Ht '5\' 11"'$  (1.803 m)   Wt 209 lb (94.8 kg)   BMI 29.15 kg/m      08/04/2021    9:06 AM 03/05/2021   12:59 PM 02/17/2021   10:59 AM  BP/Weight  Systolic BP 295 284   Diastolic BP 60 70   Wt. (Lbs) 209 211.2 205  BMI 29.15 kg/m2 30.3 kg/m2 28.59 kg/m2    Physical Exam Vitals reviewed.  Constitutional:      Appearance: Normal appearance.  HENT:     Right Ear: Tympanic membrane, ear canal and external ear normal.     Left Ear: Tympanic membrane, ear canal and external ear normal.     Nose: Nose normal. No congestion or rhinorrhea.     Mouth/Throat:     Mouth: Mucous membranes are moist.     Pharynx: No oropharyngeal exudate or posterior oropharyngeal erythema.  Neck:     Vascular: No carotid bruit.  Cardiovascular:     Rate and Rhythm: Normal rate and regular rhythm.     Heart sounds: Normal heart sounds.  Pulmonary:     Effort: Pulmonary effort is normal. No respiratory distress.     Breath sounds: Normal breath sounds. No wheezing, rhonchi or rales.  Abdominal:     General: Bowel sounds are normal.     Palpations: Abdomen is soft.     Tenderness: There is no abdominal tenderness.  Lymphadenopathy:     Cervical: No cervical adenopathy.  Neurological:     Mental Status: He is alert and oriented to person, place, and time.  Psychiatric:        Mood and Affect: Mood normal.        Behavior: Behavior normal.     Diabetic Foot Exam - Simple   No data filed      Lab Results  Component Value Date   WBC 4.0 08/04/2021   HGB 11.7 (L) 08/04/2021   HCT 35.1 (L) 08/04/2021   PLT 157 08/04/2021   GLUCOSE 82 08/04/2021   CHOL 75 (L) 08/04/2021   TRIG 58 08/04/2021   HDL 26 (L) 08/04/2021  LDLCALC 35 08/04/2021   ALT 21 08/04/2021   AST 27 08/04/2021   NA 139 08/04/2021   K 4.0 08/04/2021   CL 103 08/04/2021   CREATININE 1.20 08/04/2021   BUN 10 08/04/2021   CO2 21 08/04/2021   TSH 6.480 (H) 08/04/2021   INR 1.1 05/23/2017      Assessment & Plan:    Problem List Items Addressed This Visit       Cardiovascular and Mediastinum   Hypertensive heart disease with diastolic heart failure ([place]) - Primary    Continue current medications. Continue on amlodipine 5 mg daily. Continue Lopressor 25 mg 1 p.o. twice daily.       Relevant Orders   CBC with Differential/Platelet (Completed)   Comprehensive metabolic panel (Completed)   Aortic atherosclerosis (HCC)    Continue Crestor and aspirin.      Atherosclerosis of native artery of both lower extremities with intermittent claudication (HCC)    Continue aspirin 81 mg once daily.  Continue Crestor 20 mg nightly.      Angina pectoris (HCC)     Respiratory   Seasonal allergic rhinitis due to pollen    Start Claritin 10 mg daily.         Endocrine   Atrophy of thyroid    Check TSH.  Continue levothyroxine 75 mcg daily in AM.      Relevant Orders   TSH (Completed)   Male hypogonadism    Continue testosterone injections every 2 weeks. Check testosterone levels      Relevant Orders   Testosterone,Free and Total (Completed)     Other   Mixed hyperlipidemia    Well controlled.  No changes to medicines.  Continue Crestor 20 mg nightly. Continue to work on eating a healthy diet and exercise.  Labs drawn today.        Relevant Orders   Lipid panel (Completed)   History of esophageal cancer    Call Dr. Lyda Jester to set up an appointment.       Insomnia    Start Lunesta 2 mg nightly for insomnia.      Relevant Medications   eszopiclone (LUNESTA) 2 MG TABS tablet   Memory loss   Relevant Orders   B12 and Folate Panel (Completed)   Methylmalonic acid, serum (Completed)  .  Meds ordered this encounter  Medications   eszopiclone (LUNESTA) 2 MG TABS tablet    Sig: Take 1 tablet (2 mg total) by mouth at bedtime as needed for sleep. Take immediately before bedtime    Dispense:  30 tablet    Refill:  2    Orders Placed This Encounter  Procedures   CBC with  Differential/Platelet   Comprehensive metabolic panel   TSH   Y58 and Folate Panel   Methylmalonic acid, serum   Testosterone,Free and Total   Lipid panel     Follow-up: Return in about 3 months (around 11/04/2021) for chronic fasting.  An After Visit Summary was printed and given to the patient.  Rochel Brome, MD [facility] [phone]

## 2021-08-06 LAB — LIPID PANEL
Chol/HDL Ratio: 2.9 ratio (ref 0.0–5.0)
Cholesterol, Total: 75 mg/dL — ABNORMAL LOW (ref 100–199)
HDL: 26 mg/dL — ABNORMAL LOW (ref >39)
LDL Chol Calc (NIH): 35 mg/dL (ref 0–99)
Triglycerides: 58 mg/dL (ref 0–149)
VLDL Cholesterol Cal: 14 mg/dL (ref 5–40)

## 2021-08-06 LAB — COMPREHENSIVE METABOLIC PANEL
ALT: 21 IU/L (ref 0–44)
AST: 27 IU/L (ref 0–40)
Albumin/Globulin Ratio: 1.6 (ref 1.2–2.2)
Albumin: 4.6 g/dL (ref 3.8–4.8)
Alkaline Phosphatase: 51 IU/L (ref 44–121)
BUN/Creatinine Ratio: 8 — ABNORMAL LOW (ref 10–24)
BUN: 10 mg/dL (ref 8–27)
Bilirubin Total: 0.9 mg/dL (ref 0.0–1.2)
CO2: 21 mmol/L (ref 20–29)
Calcium: 9.5 mg/dL (ref 8.6–10.2)
Chloride: 103 mmol/L (ref 96–106)
Creatinine, Ser: 1.2 mg/dL (ref 0.76–1.27)
Globulin, Total: 2.9 g/dL (ref 1.5–4.5)
Glucose: 82 mg/dL (ref 70–99)
Potassium: 4 mmol/L (ref 3.5–5.2)
Sodium: 139 mmol/L (ref 134–144)
Total Protein: 7.5 g/dL (ref 6.0–8.5)
eGFR: 66 mL/min/{1.73_m2} (ref >59)

## 2021-08-06 LAB — CBC WITH DIFFERENTIAL/PLATELET
Basophils Absolute: 0 10*3/uL (ref 0.0–0.2)
Basos: 1 %
EOS (ABSOLUTE): 0.3 10*3/uL (ref 0.0–0.4)
Eos: 7 %
Hematocrit: 35.1 % — ABNORMAL LOW (ref 37.5–51.0)
Hemoglobin: 11.7 g/dL — ABNORMAL LOW (ref 13.0–17.7)
Immature Grans (Abs): 0 10*3/uL (ref 0.0–0.1)
Immature Granulocytes: 0 %
Lymphocytes Absolute: 1.5 10*3/uL (ref 0.7–3.1)
Lymphs: 37 %
MCH: 28.1 pg (ref 26.6–33.0)
MCHC: 33.3 g/dL (ref 31.5–35.7)
MCV: 84 fL (ref 79–97)
Monocytes Absolute: 0.2 10*3/uL (ref 0.1–0.9)
Monocytes: 5 %
Neutrophils Absolute: 2 10*3/uL (ref 1.4–7.0)
Neutrophils: 50 %
Platelets: 157 10*3/uL (ref 150–450)
RBC: 4.16 x10E6/uL (ref 4.14–5.80)
RDW: 14.2 % (ref 11.6–15.4)
WBC: 4 10*3/uL (ref 3.4–10.8)

## 2021-08-06 LAB — METHYLMALONIC ACID, SERUM: Methylmalonic Acid: 149 nmol/L (ref 0–378)

## 2021-08-06 LAB — TESTOSTERONE,FREE AND TOTAL
Testosterone, Free: 7.5 pg/mL (ref 6.6–18.1)
Testosterone: 205 ng/dL — ABNORMAL LOW (ref 264–916)

## 2021-08-06 LAB — TSH: TSH: 6.48 u[IU]/mL — ABNORMAL HIGH (ref 0.450–4.500)

## 2021-08-06 LAB — B12 AND FOLATE PANEL
Folate: 13.8 ng/mL (ref >3.0)
Vitamin B-12: 731 pg/mL (ref 232–1245)

## 2021-08-09 ENCOUNTER — Encounter: Payer: Self-pay | Admitting: Family Medicine

## 2021-08-09 DIAGNOSIS — J301 Allergic rhinitis due to pollen: Secondary | ICD-10-CM | POA: Insufficient documentation

## 2021-08-09 NOTE — Assessment & Plan Note (Signed)
Call Dr. Lyda Jester to set up an appointment.

## 2021-08-09 NOTE — Assessment & Plan Note (Signed)
Check TSH.  Continue levothyroxine 75 mcg daily in AM.

## 2021-08-09 NOTE — Assessment & Plan Note (Addendum)
>>  ASSESSMENT AND PLAN FOR ATHEROSCLEROSIS OF NATIVE ARTERY OF BOTH LOWER EXTREMITIES WITH INTERMITTENT CLAUDICATION (HCC) WRITTEN ON 08/09/2021  4:13 PM BY Kaitlin Ardito, MD  Continue aspirin 81 mg once daily.  Continue Crestor 20 mg nightly.  >>ASSESSMENT AND PLAN FOR AORTIC ATHEROSCLEROSIS (HCC) WRITTEN ON 08/09/2021  4:15 PM BY Levern Kalka, MD  Continue Crestor and aspirin.

## 2021-08-09 NOTE — Assessment & Plan Note (Signed)
Start Claritin 10 mg daily.

## 2021-08-09 NOTE — Assessment & Plan Note (Signed)
Continue Crestor and aspirin.

## 2021-08-09 NOTE — Assessment & Plan Note (Signed)
Continue testosterone injections every 2 weeks. Check testosterone levels

## 2021-08-09 NOTE — Assessment & Plan Note (Signed)
Continue current medications. Continue on amlodipine 5 mg daily. Continue Lopressor 25 mg 1 p.o. twice daily.

## 2021-08-09 NOTE — Assessment & Plan Note (Signed)
Well controlled.  No changes to medicines.  Continue Crestor 20 mg nightly. Continue to work on eating a healthy diet and exercise.  Labs drawn today.

## 2021-08-09 NOTE — Assessment & Plan Note (Signed)
Start Lunesta 2 mg nightly for insomnia.

## 2021-08-10 ENCOUNTER — Ambulatory Visit: Payer: Medicare PPO | Admitting: Cardiology

## 2021-08-10 ENCOUNTER — Other Ambulatory Visit: Payer: Self-pay

## 2021-08-10 DIAGNOSIS — D649 Anemia, unspecified: Secondary | ICD-10-CM

## 2021-08-10 MED ORDER — LEVOTHYROXINE SODIUM 88 MCG PO TABS: 88.0000 ug | ORAL_TABLET | Freq: Every day | ORAL | 0 refills | Status: DC

## 2021-08-17 ENCOUNTER — Telehealth: Payer: Self-pay

## 2021-08-17 ENCOUNTER — Ambulatory Visit (INDEPENDENT_AMBULATORY_CARE_PROVIDER_SITE_OTHER): Payer: Medicare PPO

## 2021-08-17 ENCOUNTER — Other Ambulatory Visit: Payer: Self-pay

## 2021-08-17 ENCOUNTER — Other Ambulatory Visit: Payer: Self-pay | Admitting: Family Medicine

## 2021-08-17 DIAGNOSIS — E291 Testicular hypofunction: Secondary | ICD-10-CM

## 2021-08-17 MED ORDER — LEVOTHYROXINE SODIUM 88 MCG PO TABS: 88.0000 ug | ORAL_TABLET | Freq: Every day | ORAL | 0 refills | Status: DC

## 2021-08-17 MED ORDER — TESTOSTERONE CYPIONATE 200 MG/ML IM SOLN
200.0000 mg | Freq: Once | INTRAMUSCULAR | Status: AC
  Administered 2021-08-17: 200 mg via INTRAMUSCULAR

## 2021-08-17 MED ORDER — TESTOSTERONE CYPIONATE 100 MG/ML IM SOLN
200.0000 mg | INTRAMUSCULAR | 0 refills | Status: DC
Start: 2021-08-17 — End: 2021-08-18

## 2021-08-17 NOTE — Telephone Encounter (Signed)
Patient was at the office for testosterone shot. He brought one bottle of testosterone 200 mg/ml. In his chart is Testosterone 200 mg/ml 2 ml every 14 days.  He said pharmacy gave him this dose. Does he need to receive 2 ml or 1 ml? Please advice.

## 2021-08-18 ENCOUNTER — Telehealth: Payer: Self-pay | Admitting: Family Medicine

## 2021-08-18 ENCOUNTER — Other Ambulatory Visit: Payer: Self-pay | Admitting: Family Medicine

## 2021-08-18 MED ORDER — TESTOSTERONE CYPIONATE 200 MG/ML IJ SOLN: 200.0000 mg | INTRAMUSCULAR | 2 refills | Status: DC

## 2021-08-18 NOTE — Telephone Encounter (Signed)
[facility] CALLED STATING THE testosterone OF '100MG'$  IS ON BACK ORDER BUT THEY CAN DO THE '200MG'$  IF YOUR OKAY WITH SENDING THAT PRESCRIPTION OVER.

## 2021-08-19 ENCOUNTER — Other Ambulatory Visit: Payer: Self-pay | Admitting: Family Medicine

## 2021-08-21 ENCOUNTER — Telehealth: Payer: Self-pay

## 2021-08-21 NOTE — Chronic Care Management (AMB) (Signed)
Chronic Care Management Pharmacy Assistant   Name: Mark Mejia  MRN: [id number] DOB: Jun 10, 1955  Reason for Encounter: Disease State/ Hypertension  Recent office visits:  08-17-2021 Thompson Caul I, CMA. Testosterone injection given.  08-04-2021 Cox, Elnita Maxwell, MD. Hemo= 11.7, Hema= 35.1. BUN/Creatinine ratio= 8. Cholesterol=75, HDL= 26. Testosterone= 205. TSH= 6.480. START lunesta 2 mg at bedtime as needed. STOP lasix and prednisone.  07-16-2021 Leona Singleton, RN (CCM)  06-09-2021 Luretha Rued, RN (CCM)  Recent consult visits:  None  Hospital visits:  None in previous 6 months  Medications: Outpatient Encounter Medications as of 08/21/2021  Medication Sig   amLODipine (NORVASC) 5 MG tablet Take 1 tablet (5 mg total) by mouth daily.   aspirin EC 81 MG tablet Take 81 mg by mouth daily.    dorzolamide (TRUSOPT) 2 % ophthalmic solution Place 1 drop into the right eye 2 (two) times daily.   eszopiclone (LUNESTA) 2 MG TABS tablet Take 1 tablet (2 mg total) by mouth at bedtime as needed for sleep. Take immediately before bedtime   Glycerin-Hypromellose-PEG 400 ([facility] DRY EYE RELIEF OP) Place 2 drops into both eyes daily as needed (for dry eyes).   HYDROcodone-acetaminophen (NORCO/VICODIN) 5-325 MG tablet Take 1 tablet by mouth every 6 (six) hours as needed for moderate pain.   latanoprost (XALATAN) 0.005 % ophthalmic solution Place 1 drop into the right eye at bedtime.   levothyroxine (SYNTHROID) 88 MCG tablet Take 1 tablet (88 mcg total) by mouth daily before breakfast.   meloxicam (MOBIC) 7.5 MG tablet TAKE 1 TABLET (7.5 MG TOTAL) BY MOUTH 2 (TWO) TIMES DAILY.   metoprolol tartrate (LOPRESSOR) 25 MG tablet Take 1 tablet (25 mg total) by mouth 2 (two) times daily.   nitroGLYCERIN (NITROSTAT) 0.4 MG SL tablet Place 1 tablet (0.4 mg total) under the tongue every 5 (five) minutes as needed for chest pain.   omeprazole (PRILOSEC) 40 MG capsule Take 1 capsule by mouth daily.    rosuvastatin (CRESTOR) 20 MG tablet Take 1 tablet (20 mg total) by mouth daily.   tamsulosin (FLOMAX) 0.4 MG CAPS capsule Take 2 capsules (0.8 mg total) by mouth daily.   Testosterone Cypionate 200 MG/ML SOLN Inject 200 mg as directed every 14 (fourteen) days.   tiZANidine (ZANAFLEX) 4 MG tablet Take 1 tablet (4 mg total) by mouth every 6 (six) hours as needed for muscle spasms.   Vitamin D, Ergocalciferol, (DRISDOL) 1.25 MG (50000 UNIT) CAPS capsule TAKE 1 CAPSULE EVERY SUNDAY.   No facility-administered encounter medications on file as of 08/21/2021.   Recent Office Vitals: BP Readings from Last 3 Encounters:  08/04/21 108/60  03/05/21 126/70  02/06/21 128/78   Pulse Readings from Last 3 Encounters:  08/04/21 72  03/05/21 65  01/02/21 60    Wt Readings from Last 3 Encounters:  08/04/21 209 lb (94.8 kg)  03/05/21 211 lb 3.2 oz (95.8 kg)  02/17/21 205 lb (93 kg)     Kidney Function Lab Results  Component Value Date/Time   CREATININE 1.20 08/04/2021 09:59 AM   CREATININE 1.16 01/02/2021 09:56 AM   GFRNONAA 64 12/14/2019 10:50 AM   GFRAA 74 12/14/2019 10:50 AM       Latest Ref Rng & Units 08/04/2021    9:59 AM 01/02/2021    9:56 AM 11/26/2020   12:07 PM  BMP  Glucose 70 - 99 mg/dL 82  87  84   BUN 8 - 27 mg/dL 10  9  12  Creatinine 0.76 - 1.27 mg/dL 1.20  1.16  1.07   BUN/Creat Ratio 10 - '24 8  8  11   '$ Sodium 134 - 144 mmol/L 139  139  142   Potassium 3.5 - 5.2 mmol/L 4.0  4.1  4.4   Chloride 96 - 106 mmol/L 103  102  103   CO2 20 - 29 mmol/L '21  25  26   '$ Calcium 8.6 - 10.2 mg/dL 9.5  9.4  9.8      Current antihypertensive regimen:  Amlodipine 5 mg  daily Metoprolol Tart 25 mg BID   Patient verbally confirms he is taking the above medications as directed. Yes  How often are you checking your Blood Pressure? daily  he checks his blood pressure in the morning before taking his medication.  Current home BP readings: 06-26 137/78 72, 06-25 128/84 70 Wrist or arm  cuff: arm Caffeine intake: Patient stated he drinks soda/tea but not often Salt intake: Limited OTC medications including pseudoephedrine or NSAIDs? None  Any readings above 180/120? No  What recent interventions/DTPs have been made by any provider to improve Blood Pressure control since last CPP Visit:  Educated on BP goals and benefits of medications for prevention of heart attack, stroke and kidney damage; -Counseled to monitor BP at home weekly, document, and provide log at future appointments -December 2022: Updated care plan -Recommended to continue current medication.    Any recent hospitalizations or ED visits since last visit with CPP? No  What diet changes have been made to improve Blood Pressure Control?  Patient stated he limits his sodium intake and drinks plenty of water daily.  What exercise is being done to improve your Blood Pressure Control?  Patient stated he walks some days.  Adherence Review: Is the patient currently on ACE/ARB medication? No Does the patient have >5 day gap between last estimated fill dates? CPP to review  Care Gaps: Last annual wellness visit? None Shingrix overdue PNA Vac overdue Tdap overdue  Star Rating Drugs: Rosuvastatin 20 mg- Last filled 07-11-2021 90 [facility] Clinical Pharmacist Assistant [phone]

## 2021-08-25 ENCOUNTER — Other Ambulatory Visit: Payer: Medicare PPO

## 2021-08-25 ENCOUNTER — Ambulatory Visit (INDEPENDENT_AMBULATORY_CARE_PROVIDER_SITE_OTHER): Payer: Medicare PPO

## 2021-08-25 VITALS — BP 123/72 | Wt 207.2 lb

## 2021-08-25 DIAGNOSIS — D649 Anemia, unspecified: Secondary | ICD-10-CM

## 2021-08-25 DIAGNOSIS — E663 Overweight: Secondary | ICD-10-CM

## 2021-08-25 DIAGNOSIS — E782 Mixed hyperlipidemia: Secondary | ICD-10-CM

## 2021-08-25 DIAGNOSIS — I130 Hypertensive heart and chronic kidney disease with heart failure and stage 1 through stage 4 chronic kidney disease, or unspecified chronic kidney disease: Secondary | ICD-10-CM

## 2021-08-25 LAB — CBC WITH DIFFERENTIAL/PLATELET
Basophils Absolute: 0.1 10*3/uL (ref 0.0–0.2)
Basos: 1 %
EOS (ABSOLUTE): 0.3 10*3/uL (ref 0.0–0.4)
Eos: 6 %
Hematocrit: 33.1 % — ABNORMAL LOW (ref 37.5–51.0)
Hemoglobin: 10.8 g/dL — ABNORMAL LOW (ref 13.0–17.7)
Immature Grans (Abs): 0 10*3/uL (ref 0.0–0.1)
Immature Granulocytes: 0 %
Lymphocytes Absolute: 1.6 10*3/uL (ref 0.7–3.1)
Lymphs: 38 %
MCH: 27.3 pg (ref 26.6–33.0)
MCHC: 32.6 g/dL (ref 31.5–35.7)
MCV: 84 fL (ref 79–97)
Monocytes Absolute: 0.3 10*3/uL (ref 0.1–0.9)
Monocytes: 8 %
Neutrophils Absolute: 2 10*3/uL (ref 1.4–7.0)
Neutrophils: 47 %
Platelets: 139 10*3/uL — ABNORMAL LOW (ref 150–450)
RBC: 3.96 x10E6/uL — ABNORMAL LOW (ref 4.14–5.80)
RDW: 13.9 % (ref 11.6–15.4)
WBC: 4.2 10*3/uL (ref 3.4–10.8)

## 2021-08-25 NOTE — Chronic Care Management (AMB) (Signed)
Chronic Care Management   CCM RN Visit Note  08/25/2021 Name: Mark Mejia MRN: [id number] DOB: 1955-05-30  Subjective: Mark Mejia is a 66 y.o. year old male who is a primary care patient of Cox, Kirsten, MD. The care management team was consulted for assistance with disease management and care coordination needs.    Engaged with patient by telephone for follow up visit in response to provider referral for case management and/or care coordination services.   Consent to Services:  The patient was given information about Chronic Care Management services, agreed to services, and gave verbal consent prior to initiation of services.  Please see initial visit note for detailed documentation.   Patient agreed to services and verbal consent obtained.   Assessment: Review of patient past medical history, allergies, medications, health status, including review of consultants reports, laboratory and other test data, was performed as part of comprehensive evaluation and provision of chronic care management services.   SDOH (Social Determinants of Health) assessments and interventions performed:    CCM Care Plan  No Known Allergies  Outpatient Encounter Medications as of 08/25/2021  Medication Sig   amLODipine (NORVASC) 5 MG tablet Take 1 tablet (5 mg total) by mouth daily.   aspirin EC 81 MG tablet Take 81 mg by mouth daily.    dorzolamide (TRUSOPT) 2 % ophthalmic solution Place 1 drop into the right eye 2 (two) times daily.   eszopiclone (LUNESTA) 2 MG TABS tablet Take 1 tablet (2 mg total) by mouth at bedtime as needed for sleep. Take immediately before bedtime   Glycerin-Hypromellose-PEG 400 ([facility] DRY EYE RELIEF OP) Place 2 drops into both eyes daily as needed (for dry eyes).   HYDROcodone-acetaminophen (NORCO/VICODIN) 5-325 MG tablet Take 1 tablet by mouth every 6 (six) hours as needed for moderate pain.   latanoprost (XALATAN) 0.005 % ophthalmic solution Place 1 drop into the right eye at  bedtime.   levothyroxine (SYNTHROID) 88 MCG tablet Take 1 tablet (88 mcg total) by mouth daily before breakfast.   meloxicam (MOBIC) 7.5 MG tablet TAKE 1 TABLET (7.5 MG TOTAL) BY MOUTH 2 (TWO) TIMES DAILY.   metoprolol tartrate (LOPRESSOR) 25 MG tablet Take 1 tablet (25 mg total) by mouth 2 (two) times daily.   nitroGLYCERIN (NITROSTAT) 0.4 MG SL tablet Place 1 tablet (0.4 mg total) under the tongue every 5 (five) minutes as needed for chest pain.   omeprazole (PRILOSEC) 40 MG capsule Take 1 capsule by mouth daily.   rosuvastatin (CRESTOR) 20 MG tablet Take 1 tablet (20 mg total) by mouth daily.   tamsulosin (FLOMAX) 0.4 MG CAPS capsule Take 2 capsules (0.8 mg total) by mouth daily.   Testosterone Cypionate 200 MG/ML SOLN Inject 200 mg as directed every 14 (fourteen) days.   tiZANidine (ZANAFLEX) 4 MG tablet Take 1 tablet (4 mg total) by mouth every 6 (six) hours as needed for muscle spasms.   Vitamin D, Ergocalciferol, (DRISDOL) 1.25 MG (50000 UNIT) CAPS capsule TAKE 1 CAPSULE EVERY SUNDAY.   No facility-administered encounter medications on file as of 08/25/2021.    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 ([place]) 01/02/2021   Aortic atherosclerosis ([place]) 01/02/2021   Atherosclerosis of native artery of both lower extremities with intermittent claudication ([place]) 01/02/2021   Apnea 11/26/2020   Snoring 11/26/2020   Somnolence,  daytime 11/26/2020   Lumbar back pain 11/26/2020   Other fatigue 11/26/2020   Need for immunization against influenza 11/26/2020   Overweight (BMI 25.0-29.9) 11/26/2020   CAD (coronary artery disease)    GERD (gastroesophageal reflux disease)    Hepatitis C    History of esophageal cancer    Testicular hypofunction    Vitamin D deficiency    Mixed hyperlipidemia 06/14/2019   Atrophy of thyroid  06/14/2019   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   Benign prostatic hyperplasia with nocturia 06/14/2019   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[place]) 06/14/2019   Chest pain of uncertain etiology 51/70/0174   Angina pectoris ([place]) 05/25/2017   Peripheral vascular disease, asymptomatic ([place]) 05/23/2017   Hypertrophic scar of skin 09/29/2016   Encounter for annual wellness exam in Medicare patient 03/10/2016   Encounter for adjustment and management of vascular access device 03/10/2016   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   Precordial chest pain 12/26/2015   Dyspnea on exertion 12/26/2015    Conditions to be addressed/monitored:CHF, HTN, HLD, and chronic pain  Care Plan : RN care manager plan of care  Updates made by Thana Ates, RN since 08/25/2021 12:00 AM     Problem: No plan of care established for management of chronic disease states ( chronic pain, CHF, Hypertension, HLD)   Priority: High     Long-Range Goal: Development of plan of care for chronic disease management (Chronic pain, CHF, hypertension, HLD)   Start Date: 02/17/2021  Expected End Date: 02/17/2022  Priority: High  Note:   Current Barriers:  Chronic Disease Management support and education needs related to CHF, HTN, HLD, and chronic pain No advanced directives. Reports he has an advanced directive but it needs to be updated.  02/17/2021 Patient reports that he is taking his medications as prescribed. Reports he is considering changing back to the [facility] mail in pharmacy. Patient reports that he is not taking his Lasix because he thought he made him swell. Reports that he is doing to restart this medication. Reports that he is not monitoring his weight or blood pressure. Patient reports chronic pain and take medications. Reports that he is not able to take the muscle relaxer except at night because it makes him sleepy.  Reports he is cancer free. States that he has to have his  esophagus stretched every 3 years due to scar tissue. This was stretched 13 months ago. 03/24/2021  Patient reports that he has a cold. States that his wife and son turn down the heat at night. Reports that he has had congestion for 1 day.  Reports that he has not had an exposure to covid.  Reports he has switched back to [facility] mail order pharmacy.    Heart failure- reports weighing daily. Range 206-208.  Reports he restarted taking his lasix. Reports decrease swelling in his legs and feet. Hypertension-  reports that he is self monitoring daily.  BP range of 119-145/68-77.  Reports that he is taking his medications as prescribed.  Pain- reports pain of 3/10 today. Reports pain worse in the mornings. Reports taking his medications as prescribed. Denies any new problems or concerns today.  06/09/21 Patient reports leg discomfort. He states "legs not really swelling, it is just tightness". He reports when he is on his feet for long periods this happens and states he had a lot of company for the Holiday this past weekend. He states, "It is better today". He reports he took  two Prednisolone on Saturday and one on Sunday. He reports he needs a refill. He also interested in getting a wheelchair, adding it would be easier for he and his family when they go on outings. RNCM encouraged patient to notify provider to request a hand written prescription or fax prescription to DME company of choice. Continues to weigh self daily. Weight today 209 pounds; weight yesterday 208.9. He denies signs/symptoms of heart failure exacerbation. He reports BP 137/78 today; 06/08/21 BP 147/87; 06/07/21 BP 148/84.  5/18--states he feels well; denies any chest pain, swelling, light headiness or dizziness.  Weight 202.6 (reports intentional weight loss.)  BP 130/79 with recent ranges of 130/70's.  Still needsto obtain wheelchair.  Encouraged to ask PCP for prescription at next appointment.  Also stating wife requesting evaluation for dementia;  encouraged to discuss with PCP 08/25/2021 Spoke with patient today and he reports that he is doing great. Reports that he got his Testerone injection and is feeling better. Reports more energy. States that he is walking up and down the steps for exercise.  CHF:  reports weight is stable. Slight swelling, Reports that he is taking all his medications as prescribed. States he is watching his salt intake.  Chronic pain:  reports pain level today of 2-3 in his feet.  No other complaints.  HTN: reports BP is good.   Sleep: Patient reports that he has been having difficulty sleeping, Reports he was RX a new medication and he was able to sleep for 5 hours last night.  Feeling like his sleeping is getting better.  RNCM Clinical Goal(s):  Patient will verbalize understanding of plan for management of CHF, HTN, HLD, and Chronic pain as evidenced by self reporting of improve health take all medications exactly as prescribed and will call provider for medication related questions as evidenced by patient report    attend all scheduled medical appointments: including PCP and specialist as evidenced by patient report.        collaborate with the care management team towards completion of advanced directives by patient as evidenced by verbalizing completion  through collaboration with RN Care manager, provider, and care team.   Interventions: 1:1 collaboration with primary care provider regarding development and update of comprehensive plan of care as evidenced by provider attestation and co-signature Inter-disciplinary care team collaboration (see longitudinal plan of care) Evaluation of current treatment plan related to  self management and patient's adherence to plan as established by provider  Heart Failure Interventions:  (Status: Goal Met.)  Long Term Goal  Discussed importance of daily weight and advised patient to weigh and record daily Discussed the importance of keeping all appointments with  provider Encouraged patient to continue to weigh daily. Discussed weight parameter when to call the doctor, increase 3 pounds in 24 hour, 5 pounds in a week. Patient voiced understanding  Reviewed signs/symptoms of Heart Failure exacerbation Encouraged to monitor salt intake Reviewed current state of swelling and todays weght. Encouraged patient to continue to take all his medications as prescribed. GOAL MET  Hyperlipidemia:  (Status: Goal on Track (progressing): YES.) Long Term Goal  Lab Results  Component Value Date   CHOL 75 (L) 08/04/2021   HDL 26 (L) 08/04/2021   LDLCALC 35 08/04/2021   TRIG 58 08/04/2021   CHOLHDL 2.9 08/04/2021     Reviewed Medications and encouraged to continue to take as prescribed  Encouraged to continue to attend provider appointment as scheduled  Hypertension: (Status: Goal Met.) [place]  BP readings: 06/09/21 BP 137/78  06/08/21 BP 147/87 06/07/21 BP 148/84  Last practice recorded BP readings:  BP Readings from Last 3 Encounters:  08/25/21 123/72  08/04/21 108/60  03/05/21 126/70  Most recent eGFR/CrCl:  Lab Results  Component Value Date   EGFR 66 08/04/2021      Evaluation of current treatment plan related to hypertension self management and patient's adherence to plan as established by provider;   Reviewed medications with patient and discussed importance of compliance;  Reviewed scheduled/upcoming provider appointments including:  Encouraged to eat healthy and monitor salt intake  Pain:  (Status: Goal Met.) Long Term Goal  Today's Vitals   08/25/21 1045 08/25/21 1046  BP:  123/72  Weight: 207 lb 3.2 oz (94 kg)   PainSc:  2     Pain assessment performed Medications reviewed Discussed importance of adherence to all scheduled medical appointments; Reviewed with patient prescribed pharmacological and nonpharmacological pain relief strategies; Encouraged patient to continue to take his medications as prescribed Encouraged  patient to contact provider if condition worsens or does not improve  Patient Goals/Self-Care Activities: Contact provider for if your condition worsens or does not improve Take medications as prescribed   Attend all scheduled provider appointments Call pharmacy or provider for medication refills 3-7 days in advance of running out of medications call office if I gain more than 3 pounds in a 24 hour period or 5 pounds in one week track weight in diary use salt in moderation watch for swelling in feet, ankles and legs every day weigh myself daily follow rescue plan if symptoms flare-up track symptoms and what helps feel better or worse check blood pressure daily choose a place to take my blood pressure (home, clinic or office, retail store) write blood pressure results in a log or diary keep a blood pressure log take blood pressure log to all doctor appointments call doctor for signs and symptoms of high blood pressure develop an action plan for high blood pressure keep all doctor appointments take medications for blood pressure exactly as prescribed report new symptoms to your doctor     Plan:No further follow up required: from Nurse case manager. Continue to see PCP as planned Tomasa Rand RN, BSN, CEN RN Case Manager - [facility] [employer] exhibitions officer Mobile: [phone]

## 2021-08-27 ENCOUNTER — Telehealth: Payer: Self-pay

## 2021-08-27 NOTE — Telephone Encounter (Signed)
Attempted to call patient to schedule Lab visit for iron studies. This was unable to be added to previous labs.   Royce Macadamia, [place] 08/27/21 9:24 AM

## 2021-08-28 DIAGNOSIS — I509 Heart failure, unspecified: Secondary | ICD-10-CM

## 2021-08-28 DIAGNOSIS — E785 Hyperlipidemia, unspecified: Secondary | ICD-10-CM

## 2021-08-28 DIAGNOSIS — I110 Hypertensive heart disease with heart failure: Secondary | ICD-10-CM

## 2021-08-31 ENCOUNTER — Encounter: Payer: Self-pay | Admitting: Family Medicine

## 2021-09-03 ENCOUNTER — Emergency Department (HOSPITAL_COMMUNITY)
Admission: EM | Admit: 2021-09-03 | Discharge: 2021-09-03 | Disposition: A | Discharge status: Home / Self Care | Payer: Medicare PPO | Attending: Emergency Medicine | Admitting: Emergency Medicine

## 2021-09-03 ENCOUNTER — Emergency Department (HOSPITAL_COMMUNITY): Payer: Medicare PPO

## 2021-09-03 ENCOUNTER — Encounter (HOSPITAL_COMMUNITY): Payer: Self-pay | Admitting: *Deleted

## 2021-09-03 ENCOUNTER — Other Ambulatory Visit: Payer: Self-pay

## 2021-09-03 DIAGNOSIS — R519 Headache, unspecified: Secondary | ICD-10-CM | POA: Insufficient documentation

## 2021-09-03 DIAGNOSIS — Z20822 Contact with and (suspected) exposure to covid-19: Secondary | ICD-10-CM | POA: Diagnosis not present

## 2021-09-03 DIAGNOSIS — R0602 Shortness of breath: Secondary | ICD-10-CM | POA: Diagnosis not present

## 2021-09-03 DIAGNOSIS — R059 Cough, unspecified: Secondary | ICD-10-CM | POA: Diagnosis not present

## 2021-09-03 DIAGNOSIS — Z79899 Other long term (current) drug therapy: Secondary | ICD-10-CM | POA: Diagnosis not present

## 2021-09-03 DIAGNOSIS — J189 Pneumonia, unspecified organism: Secondary | ICD-10-CM

## 2021-09-03 DIAGNOSIS — R509 Fever, unspecified: Secondary | ICD-10-CM | POA: Insufficient documentation

## 2021-09-03 DIAGNOSIS — R531 Weakness: Secondary | ICD-10-CM | POA: Insufficient documentation

## 2021-09-03 DIAGNOSIS — Z7982 Long term (current) use of aspirin: Secondary | ICD-10-CM | POA: Diagnosis not present

## 2021-09-03 LAB — COMPREHENSIVE METABOLIC PANEL
ALT: 17 U/L (ref 0–44)
AST: 24 U/L (ref 15–41)
Albumin: 4.5 g/dL (ref 3.5–5.0)
Alkaline Phosphatase: 57 U/L (ref 38–126)
Anion gap: 8 (ref 5–15)
BUN: 12 mg/dL (ref 8–23)
CO2: 25 mmol/L (ref 22–32)
Calcium: 9.5 mg/dL (ref 8.9–10.3)
Chloride: 103 mmol/L (ref 98–111)
Creatinine, Ser: 1.3 mg/dL — ABNORMAL HIGH (ref 0.61–1.24)
GFR, Estimated: >60 mL/min (ref >60)
Glucose, Bld: 91 mg/dL (ref 70–99)
Potassium: 4 mmol/L (ref 3.5–5.1)
Sodium: 136 mmol/L (ref 135–145)
Total Bilirubin: 0.9 mg/dL (ref 0.3–1.2)
Total Protein: 8.2 g/dL — ABNORMAL HIGH (ref 6.5–8.1)

## 2021-09-03 LAB — CBC WITH DIFFERENTIAL/PLATELET
Abs Immature Granulocytes: 0.01 10*3/uL (ref 0.00–0.07)
Basophils Absolute: 0 10*3/uL (ref 0.0–0.1)
Basophils Relative: 1 %
Eosinophils Absolute: 0.2 10*3/uL (ref 0.0–0.5)
Eosinophils Relative: 5 %
HCT: 36.2 % — ABNORMAL LOW (ref 39.0–52.0)
Hemoglobin: 11.7 g/dL — ABNORMAL LOW (ref 13.0–17.0)
Immature Granulocytes: 0 %
Lymphocytes Relative: 31 %
Lymphs Abs: 1.5 10*3/uL (ref 0.7–4.0)
MCH: 26.8 pg (ref 26.0–34.0)
MCHC: 32.3 g/dL (ref 30.0–36.0)
MCV: 83 fL (ref 80.0–100.0)
Monocytes Absolute: 0.3 10*3/uL (ref 0.1–1.0)
Monocytes Relative: 7 %
Neutro Abs: 2.7 10*3/uL (ref 1.7–7.7)
Neutrophils Relative %: 56 %
Platelets: 156 10*3/uL (ref 150–400)
RBC: 4.36 MIL/uL (ref 4.22–5.81)
RDW: 13.4 % (ref 11.5–15.5)
WBC: 4.8 10*3/uL (ref 4.0–10.5)
nRBC: 0 % (ref 0.0–0.2)

## 2021-09-03 LAB — URINALYSIS, ROUTINE W REFLEX MICROSCOPIC
Bilirubin Urine: NEGATIVE
Glucose, UA: NEGATIVE mg/dL
Hgb urine dipstick: NEGATIVE
Ketones, ur: NEGATIVE mg/dL
Leukocytes,Ua: NEGATIVE
Nitrite: NEGATIVE
Protein, ur: NEGATIVE mg/dL
Specific Gravity, Urine: 1.015 (ref 1.005–1.030)
pH: 5 (ref 5.0–8.0)

## 2021-09-03 LAB — LACTIC ACID, PLASMA: Lactic Acid, Venous: 0.7 mmol/L (ref 0.5–1.9)

## 2021-09-03 LAB — TYPE AND SCREEN
ABO/RH(D): B POS
Antibody Screen: NEGATIVE

## 2021-09-03 LAB — SARS CORONAVIRUS 2 BY RT PCR: SARS Coronavirus 2 by RT PCR: NEGATIVE

## 2021-09-03 MED ORDER — AMOXICILLIN 500 MG PO CAPS: 1000.0000 mg | ORAL_CAPSULE | Freq: Two times a day (BID) | ORAL | 0 refills | Status: AC

## 2021-09-03 MED ORDER — DOXYCYCLINE HYCLATE 100 MG PO CAPS: 100.0000 mg | ORAL_CAPSULE | Freq: Two times a day (BID) | ORAL | 0 refills | Status: DC

## 2021-09-03 NOTE — ED Provider Notes (Signed)
[facility] DEPT Provider Note   CSN: [id number] Arrival date & time: 09/03/21  1236     History  Chief Complaint  Patient presents with   Fever    Mark Mejia is a 66 y.o. male.  66 year old male presents with fevers which have been intermittent for the past 4 to 5 days.  Slight cough appreciated.  Does note some increased weakness.  Mild headache without photophobia or neck pain.  Patient has been monitored by his physician for concern for return of his cancer.  He has had no vomiting or diarrhea.  No abdominal or chest discomfort.       Home Medications Prior to Admission medications   Medication Sig Start Date End Date Taking? Authorizing Provider  amLODipine (NORVASC) 5 MG tablet Take 1 tablet (5 mg total) by mouth daily. 04/29/21   Rochel Brome, MD  aspirin EC 81 MG tablet Take 81 mg by mouth daily.     [provider]  dorzolamide (TRUSOPT) 2 % ophthalmic solution Place 1 drop into the right eye 2 (two) times daily.    [provider]  eszopiclone (LUNESTA) 2 MG TABS tablet Take 1 tablet (2 mg total) by mouth at bedtime as needed for sleep. Take immediately before bedtime 08/04/21   Cox, Elnita Maxwell, MD  Glycerin-Hypromellose-PEG 400 ([facility] DRY EYE RELIEF OP) Place 2 drops into both eyes daily as needed (for dry eyes).    [provider]  HYDROcodone-acetaminophen (NORCO/VICODIN) 5-325 MG tablet Take 1 tablet by mouth every 6 (six) hours as needed for moderate pain. 04/29/21   Cox, Kirsten, MD  latanoprost (XALATAN) 0.005 % ophthalmic solution Place 1 drop into the right eye at bedtime.    [provider]  levothyroxine (SYNTHROID) 88 MCG tablet Take 1 tablet (88 mcg total) by mouth daily before breakfast. 08/17/21   Cox, Kirsten, MD  meloxicam (MOBIC) 7.5 MG tablet TAKE 1 TABLET (7.5 MG TOTAL) BY MOUTH 2 (TWO) TIMES DAILY. 04/29/21   Cox, Elnita Maxwell, MD  metoprolol tartrate (LOPRESSOR) 25 MG tablet Take 1 tablet (25 mg total)  by mouth 2 (two) times daily. 04/10/21 08/04/21  Park Liter, MD  nitroGLYCERIN (NITROSTAT) 0.4 MG SL tablet Place 1 tablet (0.4 mg total) under the tongue every 5 (five) minutes as needed for chest pain. 05/07/21   Park Liter, MD  omeprazole (PRILOSEC) 40 MG capsule Take 1 capsule by mouth daily. 05/07/21   Park Liter, MD  rosuvastatin (CRESTOR) 20 MG tablet Take 1 tablet (20 mg total) by mouth daily. 04/29/21   Park Liter, MD  tamsulosin (FLOMAX) 0.4 MG CAPS capsule Take 2 capsules (0.8 mg total) by mouth daily. 04/30/21   CoxElnita Maxwell, MD  Testosterone Cypionate 200 MG/ML SOLN Inject 200 mg as directed every 14 (fourteen) days. 08/18/21   Cox, Elnita Maxwell, MD  tiZANidine (ZANAFLEX) 4 MG tablet Take 1 tablet (4 mg total) by mouth every 6 (six) hours as needed for muscle spasms. 04/29/21   Cox, Elnita Maxwell, MD  Vitamin D, Ergocalciferol, (DRISDOL) 1.25 MG (50000 UNIT) CAPS capsule TAKE 1 CAPSULE EVERY SUNDAY. 08/19/21   Rochel Brome, MD      Allergies    Patient has no known allergies.    Review of Systems   Review of Systems  All other systems reviewed and are negative.   Physical Exam Updated Vital Signs BP 125/67 (BP Location: Right Arm)   Pulse (!) 59   Temp 98 F (36.7 C) (Oral)  Resp 20   Wt 94 kg   SpO2 99%   BMI 28.90 kg/m  Physical Exam Vitals and nursing note reviewed.  Constitutional:      General: He is not in acute distress.    Appearance: Normal appearance. He is well-developed. He is not toxic-appearing.  HENT:     Head: Normocephalic and atraumatic.  Eyes:     General: Lids are normal.     Conjunctiva/sclera: Conjunctivae normal.     Pupils: Pupils are equal, round, and reactive to light.  Neck:     Thyroid: No thyroid mass.     Trachea: No tracheal deviation.  Cardiovascular:     Rate and Rhythm: Normal rate and regular rhythm.     Heart sounds: Normal heart sounds. No murmur heard.    No gallop.  Pulmonary:     Effort: Pulmonary effort  is normal. No respiratory distress.     Breath sounds: Normal breath sounds. No stridor. No decreased breath sounds, wheezing, rhonchi or rales.  Abdominal:     General: There is no distension.     Palpations: Abdomen is soft.     Tenderness: There is no abdominal tenderness. There is no rebound.  Musculoskeletal:        General: No tenderness. Normal range of motion.     Cervical back: Normal range of motion and neck supple.  Skin:    General: Skin is warm and dry.     Findings: No abrasion or rash.  Neurological:     Mental Status: He is alert and oriented to person, place, and time. Mental status is at baseline.     GCS: GCS eye subscore is 4. GCS verbal subscore is 5. GCS motor subscore is 6.     Cranial Nerves: No cranial nerve deficit.     Sensory: No sensory deficit.     Motor: Motor function is intact.  Psychiatric:        Attention and Perception: Attention normal.        Speech: Speech normal.        Behavior: Behavior normal.    ED Results / Procedures / Treatments   Labs (all labs ordered are listed, but only abnormal results are displayed) Labs Reviewed  COMPREHENSIVE METABOLIC PANEL - Abnormal; Notable for the following components:      Result Value   Creatinine, Ser 1.30 (*)    Total Protein 8.2 (*)    All other components within normal limits  CBC WITH DIFFERENTIAL/PLATELET - Abnormal; Notable for the following components:   Hemoglobin 11.7 (*)    HCT 36.2 (*)    All other components within normal limits  CULTURE, BLOOD (ROUTINE X 2)  CULTURE, BLOOD (ROUTINE X 2)  SARS CORONAVIRUS 2 BY RT PCR  LACTIC ACID, PLASMA  URINALYSIS, ROUTINE W REFLEX MICROSCOPIC  LACTIC ACID, PLASMA  TYPE AND SCREEN  ABO/RH    EKG None  Radiology DG Chest 2 View  Result Date: 09/03/2021 CLINICAL DATA:  Fever EXAM: CHEST - 2 VIEW COMPARISON:  2019 FINDINGS: Patchy increased density bilaterally may reflect ground-glass opacities. No pleural effusion or pneumothorax. Normal  heart size. No acute osseous abnormality. IMPRESSION: Patchy increased density bilaterally may reflect atypical/viral pneumonia. Electronically Signed   By: Macy Mis M.D.   On: 09/03/2021 13:55    Procedures Procedures    Medications Ordered in ED Medications - No data to display  ED Course/ Medical Decision Making/ A&P  Medical Decision Making  Patient has no evidence of sepsis at this time.  Chest x-ray interpretation shows possible early infiltrate.  He has a normal lactate.  No leukocytosis on CBC.  He is COVID-negative.  Urinalysis is negative.  Plan will be to treat for pneumonia at this time.  Discussed at length with patient and spouse and they are in agreement        Final Clinical Impression(s) / ED Diagnoses Final diagnoses:  None    Rx / DC Orders ED Discharge Orders     None         Lacretia Leigh, MD 09/03/21 1607

## 2021-09-03 NOTE — ED Triage Notes (Signed)
Fever over last 4-5 days, Headache, blood work on 6/27 with abnormal counts.

## 2021-09-03 NOTE — ED Provider Triage Note (Signed)
Emergency Medicine Provider Triage Evaluation Note  Mark Mejia , a 66 y.o. male  was evaluated in triage.  Pt complains of intermittent fever for the past 4-5 days, although has been going on for a few months. Labs monitored by PCP, decreasing HGB, low platelets. Has HA with fever otherwise no complaint.  Review of Systems  Positive: Fever, HA Negative: Changes in bowel or bladder habits  Physical Exam  BP 125/67 (BP Location: Right Arm)   Pulse (!) 59   Temp 98 F (36.7 C) (Oral)   Resp 20   Wt 94 kg   SpO2 99%   BMI 28.90 kg/m  Gen:   Awake, no distress   Resp:  Normal effort  MSK:   Moves extremities without difficulty  Other:    Medical Decision Making  Medically screening exam initiated at 12:56 PM.  Appropriate orders placed.  Mark Mejia was informed that the remainder of the evaluation will be completed by another provider, this initial triage assessment does not replace that evaluation, and the importance of remaining in the ED until their evaluation is complete.     Tacy Learn, PA-C 09/03/21 1256

## 2021-09-08 LAB — CULTURE, BLOOD (ROUTINE X 2)
Culture: NO GROWTH
Culture: NO GROWTH
Special Requests: ADEQUATE

## 2021-09-14 ENCOUNTER — Ambulatory Visit (INDEPENDENT_AMBULATORY_CARE_PROVIDER_SITE_OTHER): Payer: Medicare PPO

## 2021-09-14 DIAGNOSIS — E291 Testicular hypofunction: Secondary | ICD-10-CM

## 2021-09-14 MED ORDER — TESTOSTERONE CYPIONATE 200 MG/ML IM SOLN
200.0000 mg | INTRAMUSCULAR | Status: DC
  Administered 2021-09-14 – 2021-11-04 (×2): 200 mg via INTRAMUSCULAR

## 2021-09-14 NOTE — Progress Notes (Signed)
Mark Mejia comes in for his testosterone injection.  He has been doing well with no adverse symptoms.

## 2021-09-23 ENCOUNTER — Other Ambulatory Visit: Payer: Self-pay

## 2021-09-23 ENCOUNTER — Other Ambulatory Visit: Payer: Self-pay | Admitting: Family Medicine

## 2021-09-23 MED ORDER — LEVOTHYROXINE SODIUM 88 MCG PO TABS: 88.0000 ug | ORAL_TABLET | Freq: Every day | ORAL | 0 refills | Status: DC

## 2021-09-29 ENCOUNTER — Other Ambulatory Visit: Payer: Self-pay | Admitting: Family Medicine

## 2021-10-01 ENCOUNTER — Ambulatory Visit (INDEPENDENT_AMBULATORY_CARE_PROVIDER_SITE_OTHER): Payer: Medicare PPO

## 2021-10-01 ENCOUNTER — Other Ambulatory Visit: Payer: Self-pay | Admitting: Cardiology

## 2021-10-01 DIAGNOSIS — E782 Mixed hyperlipidemia: Secondary | ICD-10-CM

## 2021-10-01 DIAGNOSIS — I70213 Atherosclerosis of native arteries of extremities with intermittent claudication, bilateral legs: Secondary | ICD-10-CM

## 2021-10-01 DIAGNOSIS — I130 Hypertensive heart and chronic kidney disease with heart failure and stage 1 through stage 4 chronic kidney disease, or unspecified chronic kidney disease: Secondary | ICD-10-CM

## 2021-10-01 NOTE — Patient Instructions (Signed)
Visit Information   Goals Addressed   None    Patient Care Plan: CCM Pharmacy Care Plan     Problem Identified: Pain, BPH, PVD   Priority: High  Onset Date: 12/25/2020     Goal: Disease State Management   Start Date: 12/25/2020  Expected End Date: 12/25/2021  Recent Progress: On track  Priority: High  Note:   Current Barriers:  Does not maintain contact with provider office Does not contact provider office for questions/concerns  Pharmacist Clinical Goal(s):  Patient will contact provider office for questions/concerns as evidenced notation of same in electronic health record through collaboration with PharmD and provider.   Interventions: 1:1 collaboration with [facility], Elnita Maxwell, MD regarding development and update of comprehensive plan of care as evidenced by provider attestation and co-signature Inter-disciplinary care team collaboration (see longitudinal plan of care) Comprehensive medication review performed; medication list updated in electronic medical record  Hypertension (BP goal <140/90) BP Readings from Last 3 Encounters:  09/03/21 129/66  08/25/21 123/72  08/04/21 108/60  -Managed by Dr. Agustin Cree -Controlled -Current treatment: Amlodipine 43m Appropriate, Effective, Safe, Accessible Metoprolol Tart 235mBID Appropriate, Effective, Safe, Accessible -Medications previously tried: N/A  -Current home readings:  August 2023: 137/79 130/82 131/80 -Current dietary habits: "Tries to eat healthy" -Current exercise habits: None due to pain -Denies hypotensive/hypertensive symptoms -Educated on BP goals and benefits of medications for prevention of heart attack, stroke and kidney damage; -Counseled to monitor BP at home weekly, document, and provide log at future appointments -December 2022: Updated care plan -Recommended to continue current medication.   CAD (LDL goal < 100) The ASCVD Risk score (Arnett DK, et al., 2019) failed to calculate for the following  reasons:   The valid total cholesterol range is 130 to 320 mg/dL Lab Results  Component Value Date   CHOL 75 (L) 08/04/2021   CHOL 94 (L) 01/02/2021   CHOL 121 07/03/2020   Lab Results  Component Value Date   HDL 26 (L) 08/04/2021   HDL 24 (L) 01/02/2021   HDL 30 (L) 07/03/2020   Lab Results  Component Value Date   LDLCALC 35 08/04/2021   LDLCALC 52 01/02/2021   LDLCALC 65 07/03/2020   Lab Results  Component Value Date   TRIG 58 08/04/2021   TRIG 89 01/02/2021   TRIG 146 07/03/2020   Lab Results  Component Value Date   CHOLHDL 2.9 08/04/2021   CHOLHDL 3.9 01/02/2021   CHOLHDL 4.0 07/03/2020  No results found for: "LDLDIRECT"  -Controlled -Current treatment: ASA 8171mppropriate, Effective, Safe, Accessible Rosuvastatin 15m8mpropriate, Effective, Safe, Accessible -Medications previously tried: None  -Current dietary habits: "Tries to eat healthy" -Current exercise habits: None due to pain -Educated on Cholesterol goals;  October 2022: Counseled to take ASA separate from Meloxicam, will take ASA PM and meloxicam first thing in AM -Recommended to continue current medication.   BPH (Goal: improve urination) -Not ideally controlled -Night time urination frequency:  October 2022: 2-3x/night but 8x/night without meds August 2023: 1-2x/night -Current treatment: Tamsulosin 0.4mg 42mAppropriate, Effective, Safe, Accessible Kegels -Medications previously tried: N/A October 2022: Counsled on Kegels as supplemental therapy August 2023: Patient stated he's still doing kegel exercises and that he only gets up to pee once/night now. Occasionally it'll be 2x/night but only if he drank a lot that day and it's very rare  Chronic Pain -Controlled -Current treatment: Meloxicam 15mg 50mppropriate, Effective, Safe, Accessible APAP Appropriate, Effective, Safe, Accessible Hydrocodone/APAP (Takes 1/6 months) Appropriate, Effective, Safe,  Accessible Tizanidine 38m  Appropriate, Effective, Safe, Accessible -Medications previously tried:  -Pain Scale If pain is 3-4/10, will take APAP and bring to 1-2/10. If 8-9/10, will take Tizanidine and bring to 1-2/10.  Aggravating Factors: Lack of sleep, movement Pain Type: musculo-skeletal  -Recommended to continue current medication  Thyroid (Goal TSH: 0.4-4.5 Lab Results  Component Value Date   TSH 6.480 (H) 08/04/2021  -Is patient taking Biotin supplement No  -Not ideally controlled -Current treatment: Levothyroxine 864m Appropriate, Effective, Safe, Accessible -Counseled to take medication on an empty stomach -Recommended to continue current medication     Patient Goals/Self-Care Activities Patient will:  - take medications as prescribed  Follow Up Plan: The patient has been provided with contact information for the care management team and has been advised to call with any health related questions or concerns.   [facility]eturah Barre - [phone] F/U Feb 2024     Patient Care Plan: RN care manager plan of care     Problem Identified: No plan of care established for management of chronic disease states ( chronic pain, CHF, Hypertension, HLD) Resolved 08/25/2021  Priority: High     Long-Range Goal: Development of plan of care for chronic disease management (Chronic pain, CHF, hypertension, HLD) Completed 08/25/2021  Start Date: 02/17/2021  Expected End Date: 02/17/2022  Priority: High  Note:   Current Barriers:  Chronic Disease Management support and education needs related to CHF, HTN, HLD, and chronic pain No advanced directives. Reports he has an advanced directive but it needs to be updated.  02/17/2021 Patient reports that he is taking his medications as prescribed. [facility] Patient reports that he is not taking his Lasix because he thought he made him swell. Reports that he is doing to restart this medication.  Reports that he is not monitoring his weight or blood pressure. Patient reports chronic pain and take medications. Reports that he is not able to take the muscle relaxer except at night because it makes him sleepy.  Reports he is cancer free. 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 This was stretched 13 months ago. 03/24/2021  Patient reports that he has a cold. States that his wife and son turn down the heat at night. Reports that he has had congestion for 1 day.  Reports that he has not had an exposure to covid.  [facility]    Heart failure- reports weighing daily. Range 206-208.  Reports he restarted taking his lasix. Reports decrease swelling in his legs and feet. Hypertension-  reports that he is self monitoring daily.  BP range of 119-145/68-77.  Reports that he is taking his medications as prescribed.  Pain- reports pain of 3/10 today. Reports pain worse in the mornings. Reports taking his medications as prescribed. Denies any new problems or concerns today.  06/09/21 Patient reports leg discomfort. He states "legs not really swelling, it is just tightness". He reports when he is on his feet for long periods this happens and states he had a lot of company for the Holiday this past weekend. He states, "It is better today". He reports he took two Prednisolone on Saturday and one on Sunday. He reports he needs a refill. He also interested in getting a wheelchair, adding it would be easier for he and his family when they go on outings. RNCM  encouraged patient to notify provider to request a hand written prescription or fax prescription to DME company of choice. Continues to weigh self daily. Weight today 209 pounds; weight yesterday 208.9. He denies signs/symptoms of heart failure exacerbation. He reports BP 137/78 today; 06/08/21 BP 147/87; 06/07/21 BP 148/84.  5/18--states he feels well; denies any chest pain, swelling,  light headiness or dizziness.  Weight 202.6 (reports intentional weight loss.)  BP 130/79 with recent ranges of 130/70's.  Still needsto obtain wheelchair.  Encouraged to ask PCP for prescription at next appointment.  Also stating wife requesting evaluation for dementia; encouraged to discuss with PCP 08/25/2021 Spoke with patient today and he reports that he is doing great. Reports that he got his Testerone injection and is feeling better. Reports more energy. States that he is walking up and down the steps for exercise.  CHF:  reports weight is stable. Slight swelling, Reports that he is taking all his medications as prescribed. States he is watching his salt intake.  Chronic pain:  reports pain level today of 2-3 in his feet.  No other complaints.  HTN: reports BP is good.   Sleep: Patient reports that he has been having difficulty sleeping, Reports he was RX a new medication and he was able to sleep for 5 hours last night.  Feeling like his sleeping is getting better.  RNCM Clinical Goal(s):  Patient will verbalize understanding of plan for management of CHF, HTN, HLD, and Chronic pain as evidenced by self reporting of improve health take all medications exactly as prescribed and will call provider for medication related questions as evidenced by patient report    attend all scheduled medical appointments: including PCP and specialist as evidenced by patient report.        collaborate with the care management team towards completion of advanced directives by patient as evidenced by verbalizing completion  through collaboration with RN Care manager, provider, and care team.   Interventions: 1:1 collaboration with primary care provider regarding development and update of comprehensive plan of care as evidenced by provider attestation and co-signature Inter-disciplinary care team collaboration (see longitudinal plan of care) Evaluation of current treatment plan related to  self management and patient's  adherence to plan as established by provider  Heart Failure Interventions:  (Status: Goal Met.)  Long Term Goal  Discussed importance of daily weight and advised patient to weigh and record daily Discussed the importance of keeping all appointments with provider Encouraged patient to continue to weigh daily. Discussed weight parameter when to call the doctor, increase 3 pounds in 24 hour, 5 pounds in a week. Patient voiced understanding  Reviewed signs/symptoms of Heart Failure exacerbation Encouraged to monitor salt intake Reviewed current state of swelling and todays weght. Encouraged patient to continue to take all his medications as prescribed. GOAL MET  Hyperlipidemia:  (Status: Goal on Track (progressing): YES.) Long Term Goal  Lab Results  Component Value Date   CHOL 75 (L) 08/04/2021   HDL 26 (L) 08/04/2021   LDLCALC 35 08/04/2021   TRIG 58 08/04/2021   CHOLHDL 2.9 08/04/2021     Reviewed Medications and encouraged to continue to take as prescribed  Encouraged to continue to attend provider appointment as scheduled  Hypertension: (Status: Goal Met.) Long Term Goal Home BP readings: 06/09/21 BP 137/78  06/08/21 BP 147/87 06/07/21 BP 148/84  Last practice recorded BP readings:  BP Readings from Last 3 Encounters:  08/25/21 123/72  08/04/21 108/60  03/05/21 126/70  Most recent  eGFR/CrCl:  Lab Results  Component Value Date   EGFR 66 08/04/2021      Evaluation of current treatment plan related to hypertension self management and patient's adherence to plan as established by provider;   Reviewed medications with patient and discussed importance of compliance;  Reviewed scheduled/upcoming provider appointments including:  Encouraged to eat healthy and monitor salt intake  Pain:  (Status: Goal Met.) Long Term Goal  Today's Vitals   08/25/21 1045 08/25/21 1046  BP:  123/72  Weight: 207 lb 3.2 oz (94 kg)   PainSc:  2     Pain assessment performed Medications  reviewed Discussed importance of adherence to all scheduled medical appointments; Reviewed with patient prescribed pharmacological and nonpharmacological pain relief strategies; Encouraged patient to continue to take his medications as prescribed Encouraged patient to contact provider if condition worsens or does not improve  Patient Goals/Self-Care Activities: Contact provider for if your condition worsens or does not improve Take medications as prescribed   Attend all scheduled provider appointments Call pharmacy or provider for medication refills 3-7 days in advance of running out of medications call office if I gain more than 3 pounds in a 24 hour period or 5 pounds in one week track weight in diary use salt in moderation watch for swelling in feet, ankles and legs every day weigh myself daily follow rescue plan if symptoms flare-up track symptoms and what helps feel better or worse check blood pressure daily choose a place to take my blood pressure (home, clinic or office, retail store) write blood pressure results in a log or diary keep a blood pressure log take blood pressure log to all doctor appointments call doctor for signs and symptoms of high blood pressure develop an action plan for high blood pressure keep all doctor appointments take medications for blood pressure exactly as prescribed report new symptoms to your doctor     Mr. Hayduk was given information about Chronic Care Management services today including:  CCM service includes personalized support from designated clinical staff supervised by his physician, including individualized plan of care and coordination with other care providers 24/7 contact phone numbers for assistance for urgent and routine care needs. Standard insurance, coinsurance, copays and deductibles apply for chronic care management only during months in which we provide at least 20 minutes of these services. Most insurances cover these services  at 100%, however patients may be responsible for any copay, coinsurance and/or deductible if applicable. This service may help you avoid the need for more expensive face-to-face services. Only one practitioner may furnish and bill the service in a calendar month. The patient may stop CCM services at any time (effective at the end of the month) by phone call to the office staff.  Patient agreed to services and verbal consent obtained.   The patient verbalized understanding of instructions, educational materials, and care plan provided today and DECLINED offer to receive copy of patient instructions, educational materials, and care plan.  The pharmacy team will reach out to the patient again over the next 30 days.   Lane Hacker, [place]

## 2021-10-01 NOTE — Progress Notes (Signed)
Chronic Care Management Pharmacy Note  10/01/2021 Name:  Mark Mejia MRN:  [id number] DOB:  04-Sep-1955  Summary: -Pleasant 66 year old male presents for f/u CCM visit. He was born in [place] and moved to [place] in 1977 when he joined the [employer] and was stationed in [place]. We spoke extensively about our time in the service. Patient is also a Engineer, maintenance and loves watching them, however he has trouble staying up through the whole game  Recommendations/Changes made from today's visit: -BP is slightly elevated 137/79 130/82 131/80  Subjective: Mark Mejia is an 66 y.o. year old male who is a primary patient of Cox, Kirsten, MD.  The CCM team was consulted for assistance with disease management and care coordination needs.    Engaged with patient by telephone for follow up visit in response to provider referral for pharmacy case management and/or care coordination services.   Consent to Services:  The patient was given the following information about Chronic Care Management services today, agreed to services, and gave verbal consent: 1. CCM service includes personalized support from designated clinical staff supervised by the primary care provider, including individualized plan of care and coordination with other care providers 2. 24/7 contact phone numbers for assistance for urgent and routine care needs. 3. Service will only be billed when office clinical staff spend 20 minutes or more in a month to coordinate care. 4. Only one practitioner may furnish and bill the service in a calendar month. 5.The patient may stop CCM services at any time (effective at the end of the month) by phone call to the office staff. 6. The patient will be responsible for cost sharing (co-pay) of up to 20% of the service fee (after annual deductible is met). Patient agreed to services and consent obtained.  Patient Care Team: Rochel Brome, MD as PCP - General (Internal Medicine) Park Liter, MD as Consulting  Physician (Cardiology) Lane Hacker, [facility] as Consulting Physician (Pharmacist)  Recent office visits:  08-17-2021 [place], [place]. Testosterone injection given.   08-04-2021 Cox, Elnita Maxwell, MD. Hemo= 11.7, Hema= 35.1. BUN/Creatinine ratio= 8. Cholesterol=75, HDL= 26. Testosterone= 205. TSH= 6.480. START lunesta 2 mg at bedtime as needed. STOP lasix and prednisone.   07-16-2021 Leona Singleton, RN (CCM)   06-09-2021 Luretha Rued, RN (CCM)   Recent consult visits:  None   Hospital visits:  None in previous 6 months   Objective:  Lab Results  Component Value Date   CREATININE 1.30 (H) 09/03/2021   BUN 12 09/03/2021   GFRNONAA >60 09/03/2021   GFRAA 74 12/14/2019   NA 136 09/03/2021   K 4.0 09/03/2021   CALCIUM 9.5 09/03/2021   CO2 25 09/03/2021   GLUCOSE 91 09/03/2021    No results found for: "HGBA1C", "FRUCTOSAMINE", "GFR", "MICROALBUR"  Last diabetic Eye exam: No results found for: "HMDIABEYEEXA"  Last diabetic Foot exam: No results found for: "HMDIABFOOTEX"   Lab Results  Component Value Date   CHOL 75 (L) 08/04/2021   HDL 26 (L) 08/04/2021   LDLCALC 35 08/04/2021   TRIG 58 08/04/2021   CHOLHDL 2.9 08/04/2021       Latest Ref Rng & Units 09/03/2021    1:19 PM 08/04/2021    9:59 AM 01/02/2021    9:56 AM  Hepatic Function  Total Protein 6.5 - 8.1 g/dL 8.2  7.5  7.3   Albumin 3.5 - 5.0 g/dL 4.5  4.6  4.6   AST 15 -  41 U/L 24  27  32   ALT 0 - 44 U/L 17  21  25    Alk Phosphatase 38 - 126 U/L 57  51  54   Total Bilirubin 0.3 - 1.2 mg/dL 0.9  0.9  0.7     Lab Results  Component Value Date/Time   TSH 6.480 (H) 08/04/2021 09:59 AM   TSH 4.390 11/26/2020 12:07 PM       Latest Ref Rng & Units 09/03/2021    1:19 PM 08/25/2021    4:53 PM 08/04/2021    9:59 AM  CBC  WBC 4.0 - 10.5 K/uL 4.8  4.2  4.0   Hemoglobin 13.0 - 17.0 g/dL 11.7  10.8  11.7   Hematocrit 39.0 - 52.0 % 36.2  33.1  35.1   Platelets 150 - 400 K/uL 156  139  157     No results  found for: "VD25OH"  Clinical ASCVD: Yes  The ASCVD Risk score (Arnett DK, et al., 2019) failed to calculate for the following reasons:   The valid total cholesterol range is 130 to 320 mg/dL       02/17/2021   11:15 AM 01/02/2021    9:04 AM 11/26/2020   11:21 AM  Depression screen PHQ 2/9  Decreased Interest 0 0 0  Down, Depressed, Hopeless 0 0 0  PHQ - 2 Score 0 0 0     Other: (CHADS2VASc if Afib, MMRC or CAT for COPD, ACT, DEXA)  Social History   Tobacco Use  Smoking Status Never  Smokeless Tobacco Never   BP Readings from Last 3 Encounters:  09/03/21 129/66  08/25/21 123/72  08/04/21 108/60   Pulse Readings from Last 3 Encounters:  09/03/21 (!) 58  08/04/21 72  03/05/21 65   Wt Readings from Last 3 Encounters:  09/03/21 207 lb 3.2 oz (94 kg)  08/25/21 207 lb 3.2 oz (94 kg)  08/04/21 209 lb (94.8 kg)   BMI Readings from Last 3 Encounters:  09/03/21 28.90 kg/m  08/25/21 28.90 kg/m  08/04/21 29.15 kg/m    Assessment/Interventions: Review of patient past medical history, allergies, medications, health status, including review of consultants reports, laboratory and other test data, was performed as part of comprehensive evaluation and provision of chronic care management services.   SDOH:  (Social Determinants of Health) assessments and interventions performed: Yes SDOH Interventions    Flowsheet Row Most Recent Value  SDOH Interventions   Financial Strain Interventions Intervention Not Indicated  Transportation Interventions Intervention Not Indicated      SDOH Screenings   Alcohol Screen: Low Risk  (11/26/2020)   Alcohol Screen    Last Alcohol Screening Score (AUDIT): 0  Depression (PHQ2-9): Low Risk  (02/17/2021)   Depression (PHQ2-9)    PHQ-2 Score: 0  Financial Resource Strain: Low Risk  (10/01/2021)   Overall Financial Resource Strain (CARDIA)    Difficulty of Paying Living Expenses: Not hard at all  Food Insecurity: No Food Insecurity  (02/17/2021)   Hunger Vital Sign    Worried About Running Out of Food in the Last Year: Never true    Ran Out of Food in the Last Year: Never true  Housing: Low Risk  (02/17/2021)   Housing    Last Housing Risk Score: 0  Physical Activity: Not on file  Social Connections: Not on file  Stress: Not on file  Tobacco Use: Low Risk  (09/03/2021)   Patient History    Smoking Tobacco Use: Never  Smokeless Tobacco Use: Never    Passive Exposure: Not on file  Transportation Needs: No Transportation Needs (10/01/2021)   PRAPARE - Transportation    Lack of Transportation (Medical): No    Lack of Transportation (Non-Medical): No    CCM Care Plan  No Known Allergies  Medications Reviewed Today     Reviewed by Lane Hacker, [facility] (Pharmacist) on 10/01/21 at [place] List Status: <None>   Medication Order Taking? Sig Documenting Provider Last Dose Status Informant  amLODipine (NORVASC) 5 MG tablet [id number] No Take 1 tablet (5 mg total) by mouth daily. Rochel Brome, MD 09/03/2021 Active Self  aspirin EC 81 MG tablet [id number] No Take 81 mg by mouth daily.  [provider] 09/03/2021 Active Self  dorzolamide-timolol (COSOPT) 22.3-6.8 MG/ML ophthalmic solution [id number] No Place 1 drop into the right eye 2 (two) times daily. [provider] 09/03/2021 Active Self  doxycycline (VIBRAMYCIN) 100 MG capsule [id number]  Take 1 capsule (100 mg total) by mouth 2 (two) times daily. Lacretia Leigh, MD  Active   eszopiclone Johnnye Sima) 2 MG TABS tablet [id number] No Take 1 tablet (2 mg total) by mouth at bedtime as needed for sleep. Take immediately before bedtime Rochel Brome, MD 08/31/2021 Active Self  furosemide (LASIX) 20 MG tablet [id number] No Take 20 mg by mouth daily. [provider] 09/02/2021 Active Self  Glycerin-Hypromellose-PEG 400 ([facility] DRY EYE RELIEF OP) [id number] No Place 2 drops into both eyes daily as needed (for dry eyes). [provider] 08/20/2021 Active Self   HYDROcodone-acetaminophen (NORCO/VICODIN) 5-325 MG tablet [id number] No Take 1 tablet by mouth every 6 (six) hours as needed for moderate pain. Rochel Brome, MD 06/17/2021 Active Self  latanoprost (XALATAN) 0.005 % ophthalmic solution [id number] No Place 1 drop into the right eye at bedtime. [provider] 09/02/2021 Active Self  levothyroxine (SYNTHROID) 88 MCG tablet [id number]  Take 1 tablet (88 mcg total) by mouth daily before breakfast. Cox, Kirsten, MD  Active   meloxicam (MOBIC) 7.5 MG tablet [id number]  TAKE 1 TABLET TWICE DAILY Cox, Kirsten, MD  Active   metoprolol tartrate (LOPRESSOR) 25 MG tablet [id number] No Take 1 tablet (25 mg total) by mouth 2 (two) times daily. Park Liter, MD 09/03/2021 9 am Expired 09/03/21 2359 Self  nitroGLYCERIN (NITROSTAT) 0.4 MG SL tablet [id number] No Place 1 tablet (0.4 mg total) under the tongue every 5 (five) minutes as needed for chest pain. Park Liter, MD unk last dose Active Self  omeprazole (PRILOSEC) 40 MG capsule [id number]  TAKE 1 CAPSULE EVERY DAY Park Liter, MD  Active   rosuvastatin (CRESTOR) 20 MG tablet [id number] No Take 1 tablet (20 mg total) by mouth daily. Park Liter, MD 09/03/2021 Active Self  sildenafil (VIAGRA) 100 MG tablet [id number] No Take 100 mg by mouth daily as needed for erectile dysfunction. [provider] 08/31/2021 Active Self  tamsulosin (FLOMAX) 0.4 MG CAPS capsule [id number] No Take 2 capsules (0.8 mg total) by mouth daily. Rochel Brome, MD 09/03/2021 Active Self  testosterone cypionate (DEPOTESTOSTERONE CYPIONATE) injection 200 mg [id number]   Rochel Brome, MD  Active   Testosterone Cypionate 200 MG/ML SOLN [id number] No Inject 200 mg as directed every 14 (fourteen) days. Rochel Brome, MD 08/17/2021 Active Self  tiZANidine (ZANAFLEX) 4 MG tablet [id number] No Take 1 tablet (4 mg total) by mouth every 6 (six) hours as needed for muscle spasms. Rochel Brome, MD 09/02/2021 Active Self  Vitamin D,  Ergocalciferol, (DRISDOL) 1.25 MG (  50000 UNIT) CAPS capsule [id number] No TAKE 1 CAPSULE EVERY SUNDAY.  Patient taking differently: Take 50,000 Units by mouth every 7 (seven) days.   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 ([place]) 01/02/2021   Aortic atherosclerosis ([place]) 01/02/2021   Atherosclerosis of native artery of both lower extremities with intermittent claudication ([place]) 01/02/2021   Apnea 11/26/2020   Snoring 11/26/2020   Somnolence, daytime 11/26/2020   Lumbar back pain 11/26/2020   Other fatigue 11/26/2020   Need for immunization against influenza 11/26/2020   Overweight (BMI 25.0-29.9) 11/26/2020   CAD (coronary artery disease)    GERD (gastroesophageal reflux disease)    Hepatitis C    History of esophageal cancer    Testicular hypofunction    Vitamin D deficiency    Mixed hyperlipidemia 06/14/2019   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/2019   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   Benign prostatic hyperplasia with nocturia 06/14/2019   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[place]) 06/14/2019   Chest pain of uncertain etiology 64/15/8309   Angina pectoris ([place]) 05/25/2017   Peripheral vascular disease, asymptomatic ([place]) 05/23/2017   Hypertrophic scar of skin 09/29/2016   Encounter for annual wellness exam in Medicare patient 03/10/2016   Encounter for adjustment and management of vascular access device 03/10/2016   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   Precordial chest pain 12/26/2015   Dyspnea on exertion 12/26/2015    Immunization History  Administered Date(s) Administered   Fluad Quad(high Dose 65+) 12/14/2019, 11/26/2020   Influenza-Unspecified 12/01/2018   Moderna Covid-19 Vaccine Bivalent Booster 53yr & up  11/26/2020   Moderna Sars-Covid-2 Vaccination 04/27/2019, 05/30/2019, 01/18/2020   Tdap 05/31/2011    Conditions to be addressed/monitored:  Hypertension, Hyperlipidemia, and Hypothyroidism  Care Plan : CCM Pharmacy Care Plan  Updates made by KLane Hacker RPH since 10/01/2021 12:00 AM     Problem: Pain, BPH, PVD   Priority: High  Onset Date: 12/25/2020     Goal: Disease State Management   Start Date: 12/25/2020  Expected End Date: 12/25/2021  Recent Progress: On track  Priority: High  Note:   Current Barriers:  Does not maintain contact with provider office Does not contact provider office for questions/concerns  Pharmacist Clinical Goal(s):  Patient will contact provider office for questions/concerns as evidenced notation of same in electronic health record through collaboration with PharmD and provider.   Interventions: 1:1 collaboration with Cox, KElnita Maxwell MD regarding development and update of comprehensive plan of care as evidenced by provider attestation and co-signature Inter-disciplinary care team collaboration (see longitudinal plan of care) Comprehensive medication review performed; medication list updated in electronic medical record  Hypertension (BP goal <140/90) BP Readings from Last 3 Encounters:  09/03/21 129/66  08/25/21 123/72  08/04/21 108/60  -Managed by Dr. KAgustin Cree-Controlled -Current treatment: Amlodipine 564mAppropriate, Effective, Safe, Accessible Metoprolol Tart 2540mID Appropriate, Effective, Safe, Accessible -Medications previously tried: N/A  -Current home readings:  August 2023: 137/79 130/82 131/80 -Current dietary habits: "Tries to eat healthy" -Current exercise habits: None due to pain -Denies hypotensive/hypertensive symptoms -Educated on BP goals and benefits of medications for prevention of heart attack, stroke and kidney damage; -Counseled to monitor BP at home weekly, document, and provide log  at future  appointments -December 2022: Updated care plan -Recommended to continue current medication.   CAD (LDL goal < 100) The ASCVD Risk score (Arnett DK, et al., 2019) failed to calculate for the following reasons:   The valid total cholesterol range is 130 to 320 mg/dL Lab Results  Component Value Date   CHOL 75 (L) 08/04/2021   CHOL 94 (L) 01/02/2021   CHOL 121 07/03/2020   Lab Results  Component Value Date   HDL 26 (L) 08/04/2021   HDL 24 (L) 01/02/2021   HDL 30 (L) 07/03/2020   Lab Results  Component Value Date   LDLCALC 35 08/04/2021   LDLCALC 52 01/02/2021   LDLCALC 65 07/03/2020   Lab Results  Component Value Date   TRIG 58 08/04/2021   TRIG 89 01/02/2021   TRIG 146 07/03/2020   Lab Results  Component Value Date   CHOLHDL 2.9 08/04/2021   CHOLHDL 3.9 01/02/2021   CHOLHDL 4.0 07/03/2020  No results found for: "LDLDIRECT"  -Controlled -Current treatment: ASA 75m Appropriate, Effective, Safe, Accessible Rosuvastatin 281mAppropriate, Effective, Safe, Accessible -Medications previously tried: None  -Current dietary habits: "Tries to eat healthy" -Current exercise habits: None due to pain -Educated on Cholesterol goals;  October 2022: Counseled to take ASA separate from Meloxicam, will take ASA PM and meloxicam first thing in AM -Recommended to continue current medication.   BPH (Goal: improve urination) -Not ideally controlled -Night time urination frequency:  October 2022: 2-3x/night but 8x/night without meds August 2023: 1-2x/night -Current treatment: Tamsulosin 0.44m77mS Appropriate, Effective, Safe, Accessible Kegels -Medications previously tried: N/A October 2022: Counsled on Kegels as supplemental therapy August 2023: Patient stated he's still doing kegel exercises and that he only gets up to pee once/night now. Occasionally it'll be 2x/night but only if he drank a lot that day and it's very rare  Chronic Pain -Controlled -Current  treatment: Meloxicam 81m66m Appropriate, Effective, Safe, Accessible APAP Appropriate, Effective, Safe, Accessible Hydrocodone/APAP (Takes 1/6 months) Appropriate, Effective, Safe, Accessible Tizanidine 44mg 44mropriate, Effective, Safe, Accessible -Medications previously tried:  -Pain Scale If pain is 3-4/10, will take APAP and bring to 1-2/10. If 8-9/10, will take Tizanidine and bring to 1-2/10.  Aggravating Factors: Lack of sleep, movement Pain Type: musculo-skeletal  -Recommended to continue current medication  Thyroid (Goal TSH: 0.4-4.5 Lab Results  Component Value Date   TSH 6.480 (H) 08/04/2021  -Is patient taking Biotin supplement No  -Not ideally controlled -Current treatment: Levothyroxine 88mcg71mropriate, Effective, Safe, Accessible -Counseled to take medication on an empty stomach -Recommended to continue current medication     Patient Goals/Self-Care Activities Patient will:  - take medications as prescribed  Follow Up Plan: The patient has been provided with contact information for the care management team and has been advised to call with any health related questions or concerns.   NathanArizona Constablem.D. - [phone]  CPP F/U Feb 2024       Medication Assistance: None required.  Patient affirms current coverage meets needs.  Compliance/Adherence/Medication fill history:   Care Gaps: Last annual wellness visit? None Shingrix overdue PNA Vac overdue Tdap overdue   Star Rating Drugs: Rosuvastatin 20 mg- Last filled 07-11-2021 90 DS [employer]  Patient's preferred pharmacy [facility]:  [facility] [address] [address]: [phone] [address]  [place] [address] Phone: [phone] Fax: [phone]  Uses pill box? Yes Pt endorses 100% compliance  Patient would like to use [facility] but is non-preferred. If his insurance changes, will onboard  Care Plan and Follow Up Patient Decision:  Patient agrees to Care Plan and Follow-up.  Plan: The patient has been provided with contact information for the care management team and has been advised to call with any health related questions or concerns.   [place] Constable, Pharm.D. - [phone]  CPP F/U Feb 2024

## 2021-10-06 DIAGNOSIS — H401113 Primary open-angle glaucoma, right eye, severe stage: Secondary | ICD-10-CM | POA: Diagnosis not present

## 2021-10-07 ENCOUNTER — Ambulatory Visit (INDEPENDENT_AMBULATORY_CARE_PROVIDER_SITE_OTHER): Payer: Medicare PPO

## 2021-10-07 DIAGNOSIS — E291 Testicular hypofunction: Secondary | ICD-10-CM | POA: Diagnosis not present

## 2021-10-07 MED ORDER — TESTOSTERONE CYPIONATE 200 MG/ML IM SOLN
200.0000 mg | Freq: Once | INTRAMUSCULAR | Status: AC
  Administered 2021-10-07: 200 mg via INTRAMUSCULAR

## 2021-10-09 ENCOUNTER — Other Ambulatory Visit: Payer: Self-pay | Admitting: Family Medicine

## 2021-10-13 ENCOUNTER — Telehealth: Payer: Self-pay

## 2021-10-13 NOTE — Telephone Encounter (Signed)
Mark Mejia called with bp concerns.  He reports that last week he was out side playing volleyball and he thinks he got too hot.  He developed a headache and felt light-headed.  He cooled down but has not felt like himself.  Yesterday he checked his bp and it was 91/50 pulse 72.  He repeated it a little later and it was 100/58, pulse 56.  He held his norvasc this morning and his bp is now 117/73, pulse 72.  He has been drinking plenty of fluids.

## 2021-10-13 NOTE — Telephone Encounter (Signed)
Patient was informed.

## 2021-10-14 ENCOUNTER — Encounter: Payer: Self-pay | Admitting: Physician Assistant

## 2021-10-14 ENCOUNTER — Ambulatory Visit (INDEPENDENT_AMBULATORY_CARE_PROVIDER_SITE_OTHER): Payer: Medicare PPO | Admitting: Physician Assistant

## 2021-10-14 ENCOUNTER — Telehealth: Payer: Self-pay | Admitting: Cardiology

## 2021-10-14 VITALS — BP 100/58 | HR 65 | Temp 98.3°F | Resp 18 | Ht 71.0 in | Wt 205.0 lb

## 2021-10-14 DIAGNOSIS — R899 Unspecified abnormal finding in specimens from other organs, systems and tissues: Secondary | ICD-10-CM | POA: Diagnosis not present

## 2021-10-14 DIAGNOSIS — R42 Dizziness and giddiness: Secondary | ICD-10-CM | POA: Diagnosis not present

## 2021-10-14 DIAGNOSIS — R0789 Other chest pain: Secondary | ICD-10-CM

## 2021-10-14 DIAGNOSIS — Z79899 Other long term (current) drug therapy: Secondary | ICD-10-CM | POA: Diagnosis not present

## 2021-10-14 LAB — HEPATIC FUNCTION PANEL
ALT: 29 U/L (ref 10–40)
AST: 40 (ref 14–40)
Alkaline Phosphatase: 61 (ref 25–125)
Bilirubin, Total: 0.7

## 2021-10-14 LAB — BASIC METABOLIC PANEL
BUN: 13 (ref 4–21)
CO2: 29 — AB (ref 13–22)
Chloride: 101 (ref 99–108)
Creatinine: 1.1 (ref 0.6–1.3)
Glucose: 84
Potassium: 4 mEq/L (ref 3.5–5.1)
Sodium: 138 (ref 137–147)

## 2021-10-14 LAB — CBC AND DIFFERENTIAL
HCT: 41 (ref 41–53)
Hemoglobin: 13.8 (ref 13.5–17.5)
Platelets: 137 10*3/uL — AB (ref 150–400)
WBC: 4.3

## 2021-10-14 LAB — COMPREHENSIVE METABOLIC PANEL
Albumin: 4.6 (ref 3.5–5.0)
Calcium: 12 — AB (ref 8.7–10.7)

## 2021-10-14 LAB — VITAMIN B12: Vitamin B-12: 670

## 2021-10-14 LAB — CBC: RBC: 2.1 — AB (ref 3.87–5.11)

## 2021-10-14 NOTE — Addendum Note (Signed)
Addended byMarge Duncans on: 10/14/2021 03:09 PM   Modules accepted: Orders

## 2021-10-14 NOTE — Progress Notes (Addendum)
Acute Office Visit  Subjective:    Patient ID: Mark Mejia, male    DOB: 1955-03-05, 66 y.o.   MRN: [id number]  Chief Complaint  Patient presents with   dizziness    HPI: Patient is in today for complaints of dizziness and malaise.  Pt states that on Friday he went to park and played volleyball.  After 18mn of activity he had episode of dizziness, blurry vision, pain in his upper back and left shoulder.  Upon further questioning he states he did have some mild chest discomfort as well.  He went and sat in his car and felt some better but the rest of the evening he had general malaise.  The next day he was fatigued and 'just did not feel good' .  On Sunday he states he started feeling better but then on Monday noted his blood pressure was a lower than normal and did have some light headedness with bending over.  Today he feels better and denies dizziness or chest pain.  With further questioning pt admits that in the past several weeks he has noted irregular heart rhythm several times daily -  Patient does follow with cardiology Dr KAgustin Creeand has a follow up appt in September According to last labwork pt was noted to have slightly low hgb and has not been back for repeat testing or iron studies.  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[facility] 06/14/2019   Hypertrophic scar  of skin 09/29/2016   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   Mixed hyperlipidemia    Peripheral vascular disease, asymptomatic ([facility] 05/23/2017   80-99% stenosis in the left internal coronary artery   Precordial chest pain 12/26/2015   Shortness of breath 12/26/2015   Testicular hypofunction    Vitamin D deficiency     Past Surgical History:  Procedure Laterality Date   GASTROSTOMY W/ FEEDING TUBE     LEFT HEART CATH AND CORONARY ANGIOGRAPHY N/A 05/25/2017   Procedure: LEFT HEART CATH AND CORONARY ANGIOGRAPHY;  Surgeon: JMartinique Peter M, MD;  [facility];  Service: Cardiovascular;  Laterality: N/A;   PORTACATH PLACEMENT     THROAT SURGERY      Family History  Problem Relation Age of Onset   Arthritis Mother    Hypertension Mother    Cancer - Other Maternal Grandmother     Social History   Socioeconomic History   Marital status: Married    Spouse name: [name]   Number of children: 1   Years of education: college   Highest education level: Not on file  Occupational History   Occupation: Retired  Tobacco Use   Smoking status: Never   Smokeless tobacco: Never  Vaping Use   Vaping Use: Never used  Substance and Sexual Activity   Alcohol use: No   Drug use: No   Sexual activity: Not on  file  Other Topics Concern   Not on file  Social History Narrative   Currently retired/disabled   Social Determinants of Health   Financial Resource Strain: Low Risk  (10/01/2021)   Overall Financial Resource Strain (CARDIA)    Difficulty of Paying Living Expenses: Not hard at all  Food Insecurity: No Food Insecurity (02/17/2021)   Hunger Vital Sign    Worried About Running Out of Food in the Last Year: Never true    Ran Out of Food in the Last Year: Never true  Transportation Needs: No Transportation Needs (10/01/2021)   PRAPARE - Hydrologist (Medical): No    Lack of Transportation (Non-Medical): No  Physical Activity: Not on file  Stress: Not on file   Social Connections: Not on file  Intimate Partner Violence: Not At Risk (02/17/2021)   Humiliation, Afraid, Rape, and Kick questionnaire    Fear of Current or Ex-Partner: No    Emotionally Abused: No    Physically Abused: No    Sexually Abused: No    Outpatient Medications Prior to Visit  Medication Sig Dispense Refill   amLODipine (NORVASC) 5 MG tablet Take 1 tablet (5 mg total) by mouth daily. 90 tablet 1   aspirin EC 81 MG tablet Take 81 mg by mouth daily.      dorzolamide-timolol (COSOPT) 22.3-6.8 MG/ML ophthalmic solution Place 1 drop into the right eye 2 (two) times daily.     eszopiclone (LUNESTA) 2 MG TABS tablet Take 1 tablet (2 mg total) by mouth at bedtime as needed for sleep. Take immediately before bedtime 30 tablet 2   furosemide (LASIX) 20 MG tablet Take 20 mg by mouth daily.     Glycerin-Hypromellose-PEG 400 ([facility] DRY EYE RELIEF OP) Place 2 drops into both eyes daily as needed (for dry eyes).     HYDROcodone-acetaminophen (NORCO/VICODIN) 5-325 MG tablet Take 1 tablet by mouth every 6 (six) hours as needed for moderate pain. 30 tablet 0   latanoprost (XALATAN) 0.005 % ophthalmic solution Place 1 drop into the right eye at bedtime.     levothyroxine (SYNTHROID) 88 MCG tablet Take 1 tablet (88 mcg total) by mouth daily before breakfast. 90 tablet 0   meloxicam (MOBIC) 7.5 MG tablet TAKE 1 TABLET TWICE DAILY 180 tablet 1   metoprolol tartrate (LOPRESSOR) 25 MG tablet Take 1 tablet (25 mg total) by mouth 2 (two) times daily. 180 tablet 3   nitroGLYCERIN (NITROSTAT) 0.4 MG SL tablet Place 1 tablet (0.4 mg total) under the tongue every 5 (five) minutes as needed for chest pain. 25 tablet 1   omeprazole (PRILOSEC) 40 MG capsule TAKE 1 CAPSULE EVERY DAY 90 capsule 1   rosuvastatin (CRESTOR) 20 MG tablet Take 1 tablet (20 mg total) by mouth daily. 90 tablet 2   sildenafil (VIAGRA) 100 MG tablet Take 100 mg by mouth daily as needed for erectile dysfunction.     tamsulosin (FLOMAX) 0.4  MG CAPS capsule Take 2 capsules (0.8 mg total) by mouth daily. 180 capsule 0   Testosterone Cypionate 200 MG/ML SOLN Inject 200 mg as directed every 14 (fourteen) days. 5 mL 2   tiZANidine (ZANAFLEX) 4 MG tablet TAKE 1 TABLET EVERY 6 HOURS AS NEEDED FOR MUSCLE SPASM(S) 90 tablet 1   Vitamin D, Ergocalciferol, (DRISDOL) 1.25 MG (50000 UNIT) CAPS capsule TAKE 1 CAPSULE EVERY SUNDAY. (Patient taking differently: Take 50,000 Units by mouth every 7 (seven) days.) 12 capsule 1   doxycycline (VIBRAMYCIN) 100  MG capsule Take 1 capsule (100 mg total) by mouth 2 (two) times daily. 20 capsule 0   Facility-Administered Medications Prior to Visit  Medication Dose Route Frequency Provider Last Rate Last Admin   testosterone cypionate (DEPOTESTOSTERONE CYPIONATE) injection 200 mg  200 mg Intramuscular Q14 Days Cox, Kirsten, MD   200 mg at 09/14/21 1419    No Known Allergies  Review of Systems CONSTITUTIONAL: see HPI E/N/T: Negative for ear pain, nasal congestion and sore throat.  CARDIOVASCULAR: see HPI RESPIRATORY: Negative for recent cough and dyspnea.  GASTROINTESTINAL: Negative for abdominal pain, acid reflux symptoms, constipation, diarrhea, nausea and vomiting.  MSK: Negative for arthralgias and myalgias.  INTEGUMENTARY: Negative for rash.  NEUROLOGICAL:see HPI      Objective:  PHYSICAL EXAM:   VS: BP (!) 100/58   Pulse 65   Temp 98.3 F (36.8 C)   Resp 18   Ht 5' 11"  (1.803 m)   Wt 205 lb (93 kg)   SpO2 96%   BMI 28.59 kg/m  Orthostatic Vitals for the past 48 hrs (Last 6 readings):  Patient Position Orthostatic BP Orthostatic Pulse  10/14/21 0925 Supine 106/72 58  10/14/21 0926 Sitting 104/64 58  10/14/21 0928 Standing 96/58 18    GEN: Well nourished, well developed, in no acute distress   Neck: no JVD or masses - no thyromegaly Cardiac: RRR; no murmurs, rubs, or gallops,no edema -  Respiratory:  normal respiratory rate and pattern with no distress - normal breath sounds with  no rales, rhonchi, wheezes or rubs  MS: no deformity or atrophy  Skin: warm and dry, no rash  Neuro:  Alert and Oriented x 3, - CN II-Xii grossly intact Psych: euthymic mood, appropriate affect and demeanor  EKG - no acute changes - bradycardia - T wave inversion Health Maintenance Due  Topic Date Due   Zoster Vaccines- Shingrix (1 of 2) Never done   Pneumonia Vaccine 71+ Years old (1 - PCV) Never done   COVID-19 Vaccine (5 - Moderna risk series) 01/21/2021   TETANUS/TDAP  05/30/2021   INFLUENZA VACCINE  09/29/2021    There are no preventive care reminders to display for this patient.   Lab Results  Component Value Date   TSH 6.480 (H) 08/04/2021   Lab Results  Component Value Date   WBC 4.8 09/03/2021   HGB 11.7 (L) 09/03/2021   HCT 36.2 (L) 09/03/2021   MCV 83.0 09/03/2021   PLT 156 09/03/2021   Lab Results  Component Value Date   NA 136 09/03/2021   K 4.0 09/03/2021   CO2 25 09/03/2021   GLUCOSE 91 09/03/2021   BUN 12 09/03/2021   CREATININE 1.30 (H) 09/03/2021   BILITOT 0.9 09/03/2021   ALKPHOS 57 09/03/2021   AST 24 09/03/2021   ALT 17 09/03/2021   PROT 8.2 (H) 09/03/2021   ALBUMIN 4.5 09/03/2021   CALCIUM 9.5 09/03/2021   ANIONGAP 8 09/03/2021   EGFR 66 08/04/2021   Lab Results  Component Value Date   CHOL 75 (L) 08/04/2021   Lab Results  Component Value Date   HDL 26 (L) 08/04/2021   Lab Results  Component Value Date   LDLCALC 35 08/04/2021   Lab Results  Component Value Date   TRIG 58 08/04/2021   Lab Results  Component Value Date   CHOLHDL 2.9 08/04/2021   No results found for: "HGBA1C"     Assessment & Plan:   Problem List Items Addressed This Visit  Other   Other chest pain   Relevant Orders   Troponin T   Dizziness - Primary   Relevant Orders   EKG 12-Lead   CBC with Differential/Platelet   Comprehensive metabolic panel   TSH   Abnormal laboratory test   Relevant Orders   Iron, TIBC and Ferritin Panel   CBC  with Differential/Platelet   No orders of the defined types were placed in this encounter.   Orders Placed This Encounter  Procedures   Iron, TIBC and Ferritin Panel   CBC with Differential/Platelet   Comprehensive metabolic panel   Troponin T   TSH   EKG 12-Lead   SENT FOR STAT LABS AT HOSPITAL - IF TROPONIN ELEVATED WILL SEND TO ED - IF NORMAL WILL GET APPT WITH CARDIOLOGY URGENTLY RECOMMEND TO STOP NORVASC GIVEN BP HAS ALSO BEEN LOW (HE WAS NOT BEEN TAKING FOR PAST FEW DAYS)  Follow-up: Return if symptoms worsen or fail to improve and for chronic follow up Dr Tobie Poet.  An After Visit Summary was printed and given to the patient.  [facility] Fl[facility] [phone]

## 2021-10-14 NOTE — Telephone Encounter (Signed)
Pt c/o Syncope: STAT if syncope occurred within 30 minutes and pt complains of lightheadedness High Priority if episode of passing out, completely, today or in last 24 hours   Did you pass out today?  No   When is the last time you passed out?  Friday, 8/11   Has this occurred multiple times?  Yes   Did you have any symptoms prior to passing out?   Dizziness and palpitations  Caller stated they requested STAT labs for the patient and they were all normal.  Caller stated they wanted a sooner appointment for this patient.  Patient is willing to travel to [place].

## 2021-10-14 NOTE — Telephone Encounter (Signed)
Patient has been scheduled with Dr. Agustin Cree in the [facility] office tomorrow, August 16. He is aware of this appointment.

## 2021-10-15 ENCOUNTER — Encounter: Payer: Self-pay | Admitting: Cardiology

## 2021-10-15 ENCOUNTER — Ambulatory Visit: Payer: Medicare PPO | Admitting: Cardiology

## 2021-10-15 VITALS — BP 148/78 | HR 68 | Ht 70.0 in | Wt 206.0 lb

## 2021-10-15 DIAGNOSIS — I2510 Atherosclerotic heart disease of native coronary artery without angina pectoris: Secondary | ICD-10-CM | POA: Diagnosis not present

## 2021-10-15 DIAGNOSIS — Z8521 Personal history of malignant neoplasm of larynx: Secondary | ICD-10-CM

## 2021-10-15 DIAGNOSIS — I739 Peripheral vascular disease, unspecified: Secondary | ICD-10-CM | POA: Diagnosis not present

## 2021-10-15 DIAGNOSIS — R079 Chest pain, unspecified: Secondary | ICD-10-CM | POA: Diagnosis not present

## 2021-10-15 DIAGNOSIS — I5030 Unspecified diastolic (congestive) heart failure: Secondary | ICD-10-CM

## 2021-10-15 DIAGNOSIS — I110 Hypertensive heart disease with heart failure: Secondary | ICD-10-CM

## 2021-10-15 MED ORDER — NITROGLYCERIN 0.4 MG SL SUBL: 0.4000 mg | SUBLINGUAL_TABLET | SUBLINGUAL | 1 refills | Status: DC | PRN

## 2021-10-15 NOTE — Addendum Note (Signed)
Addended by: Jacobo Forest D on: 10/15/2021 04:23 PM   Modules accepted: Orders

## 2021-10-15 NOTE — Progress Notes (Signed)
Cardiology Office Note:    Date:  10/15/2021   ID:  Mark Mejia, DOB 08-Apr-1955, MRN [id number]  PCP:  Rochel Brome, MD  Cardiologist:  Jenne Campus, MD    Referring MD: Rochel Brome, MD   No chief complaint on file. I had some neck pain  History of Present Illness:    Mark Mejia is a 66 y.o. male with past medical history significant for atypical chest pain he did have cardiac catheterization showing nonobstructive coronary artery disease, carotic arterial disease with up to 59% stenosis bilaterally, essential hypertension, dyslipidemia.  He was referred back to [place] because he suffered from some atypical symptoms few days ago he was playing volleyball and started having some pain in the in the neck and the back.  He thought maybe it was related to overheating he went to drink some water he went to the car cold and gradually the sensation went away then couple days later he took his he did not do much and after that he had another time the same situation he denies have any chest pain tightness squeezing pressure burning chest but at the very end of the discussion when I warned him about if he develop those current symptoms he said he may have a little bit sensation of the chest.  His primary care physician sent troponin I and he was received phone call after that everything is normal however concern was the fact that his EKG shows some asymmetrical T inversion in anterior precordium.  At the moment my interview he is asymptomatic, he denies have any chest pain tightness squeezing pressure burning chest.  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[place]) 06/14/2019   Hypertrophic scar of skin 09/29/2016   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   Mixed hyperlipidemia    Peripheral vascular disease, asymptomatic ([place]) 05/23/2017   80-99% stenosis in the left internal coronary artery   Precordial chest pain 12/26/2015   Shortness of breath 12/26/2015   Testicular hypofunction    Vitamin D deficiency     Past Surgical History:  Procedure Laterality Date   GASTROSTOMY W/ FEEDING TUBE     LEFT HEART CATH AND CORONARY ANGIOGRAPHY N/A 05/25/2017   Procedure: LEFT HEART CATH AND CORONARY ANGIOGRAPHY;  Surgeon: Martinique, Peter M, MD;  Location: [facility];  Service: Cardiovascular;  Laterality: N/A;   PORTACATH PLACEMENT     THROAT SURGERY      Current Medications: Current Meds  Medication Sig   aspirin EC 81 MG tablet Take 81 mg by mouth daily.    dorzolamide-timolol (COSOPT) 22.3-6.8 MG/ML ophthalmic solution Place 1 drop into the right eye 2 (two) times daily.   eszopiclone (LUNESTA) 2 MG TABS tablet Take 1 tablet (2 mg total) by mouth at bedtime as needed for sleep. Take immediately before bedtime   furosemide (LASIX) 20 MG tablet Take 20 mg by mouth  daily.   Glycerin-Hypromellose-PEG 400 ([facility] DRY EYE RELIEF OP) Place 2 drops into both eyes daily as needed (for dry eyes).   HYDROcodone-acetaminophen (NORCO/VICODIN) 5-325 MG tablet Take 1 tablet by mouth every 6 (six) hours as needed for moderate pain.   latanoprost (XALATAN) 0.005 % ophthalmic solution Place 1 drop into the right eye at bedtime.   levothyroxine (SYNTHROID) 88 MCG tablet Take 1 tablet (88 mcg total) by mouth daily before breakfast.   meloxicam (MOBIC) 7.5 MG tablet TAKE 1 TABLET TWICE DAILY   metoprolol tartrate (LOPRESSOR) 25 MG  tablet Take 1 tablet (25 mg total) by mouth 2 (two) times daily.   nitroGLYCERIN (NITROSTAT) 0.4 MG SL tablet Place 1 tablet (0.4 mg total) under the tongue every 5 (five) minutes as needed for chest pain.   omeprazole (PRILOSEC) 40 MG capsule TAKE 1 CAPSULE EVERY DAY   rosuvastatin (CRESTOR) 20 MG tablet Take 1 tablet (20 mg total) by mouth daily.   sildenafil (VIAGRA) 100 MG tablet Take 100 mg by mouth daily as needed for erectile dysfunction.   tamsulosin (FLOMAX) 0.4 MG CAPS capsule Take 2 capsules (0.8 mg total) by mouth daily.   Testosterone Cypionate 200 MG/ML SOLN Inject 200 mg as directed every 14 (fourteen) days.   tiZANidine (ZANAFLEX) 4 MG tablet TAKE 1 TABLET EVERY 6 HOURS AS NEEDED FOR MUSCLE SPASM(S)   Vitamin D, Ergocalciferol, (DRISDOL) 1.25 MG (50000 UNIT) CAPS capsule TAKE 1 CAPSULE EVERY SUNDAY. (Patient taking differently: Take 50,000 Units by mouth every 7 (seven) days.)   Current Facility-Administered Medications for the 10/15/21 encounter (Office Visit) with Park Liter, MD  Medication   testosterone cypionate (DEPOTESTOSTERONE CYPIONATE) injection 200 mg     Allergies:   Patient has no known allergies.   Social History   Socioeconomic History   Marital status: Married    Spouse name: [name]   Number of children: 1   Years of education: college   Highest education level: Not on file  Occupational History   Occupation: Retired  Tobacco Use   Smoking status: Never   Smokeless tobacco: Never  Vaping Use   Vaping Use: Never used  Substance and Sexual Activity   Alcohol use: No   Drug use: No   Sexual activity: Not on file  Other Topics Concern   Not on file  Social History Narrative   Currently retired/disabled   Social Determinants of Health   Financial Resource Strain: Low Risk  (10/01/2021)   Overall Financial Resource Strain (CARDIA)    Difficulty of Paying Living Expenses: Not hard at all  Food Insecurity: No Food Insecurity (02/17/2021)    Hunger Vital Sign    Worried About Running Out of Food in the Last Year: Never true    [place] in the Last Year: Never true  Transportation Needs: No Transportation Needs (10/01/2021)   PRAPARE - Hydrologist (Medical): No    Lack of Transportation (Non-Medical): No  Physical Activity: Not on file  Stress: Not on file  Social Connections: Not on file     Family History: The patient's family history includes Arthritis in his mother; Cancer - Other in his maternal grandmother; Hypertension in his mother. ROS:   Please see the history of present illness.    All 14 point review of systems negative except as described per history of present illness  EKGs/Labs/Other Studies Reviewed:      Recent Labs: 08/04/2021: TSH 6.480 09/03/2021: ALT 17;  BUN 12; Creatinine, Ser 1.30; Hemoglobin 11.7; Platelets 156; Potassium 4.0; Sodium 136  Recent Lipid Panel    Component Value Date/Time   CHOL 75 (L) 08/04/2021 0959   TRIG 58 08/04/2021 0959   HDL 26 (L) 08/04/2021 0959   CHOLHDL 2.9 08/04/2021 0959   LDLCALC 35 08/04/2021 0959    Physical Exam:    VS:  BP (!) 148/78 (BP Location: Right Arm, Patient Position: Sitting)   Pulse 68   Ht '5\' 10"'$  (1.778 m)   Wt 206 lb 0.6 oz (93.5 kg)   SpO2 99%   BMI 29.56 kg/m     Wt Readings from Last 3 Encounters:  10/15/21 206 lb 0.6 oz (93.5 kg)  10/14/21 205 lb (93 kg)  09/03/21 207 lb 3.2 oz (94 kg)     GEN:  Well nourished, well developed in no acute distress HEENT: Normal NECK: No JVD; No carotid bruits LYMPHATICS: No lymphadenopathy CARDIAC: RRR, no murmurs, no rubs, no gallops RESPIRATORY:  Clear to auscultation without rales, wheezing or rhonchi  ABDOMEN: Soft, non-tender, non-distended MUSCULOSKELETAL:  No edema; No deformity  SKIN: Warm and dry LOWER EXTREMITIES: no swelling NEUROLOGIC:  Alert and oriented x 3 PSYCHIATRIC:  Normal affect   ASSESSMENT:    1. Coronary artery disease involving  native coronary artery of native heart without angina pectoris   2. Peripheral vascular disease, asymptomatic ([place])   3. History of laryngeal cancer   4. 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)    PLAN:    In order of problems listed above:  Coronary disease only nonobstructive based on cardiac cath from 2019, he did have some symptoms some suspicious for coronary event.  Troponin I negative, he is on antiplatelet therapy which I will continue I give him [place] prescription for nitroglycerin and we did talk about how will be the best way to look at his coronary arteries and I will schedule him to have coronary CT angio.  I told him also if you develop chest pain that is not relieved by nitroglycerin he needs to go to the emergency room. Dyslipidemia, I did review K PN which show me his LDL of 35 and HDL 26 this is from 08/04/2021.  We will continue present management. 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  He will be scheduled to have carotic ultrasounds later Hypertensive heart disease with diastolic dysfunction.  Blood pressure mildly elevated today but he is very excited about what we talking about.  He also describes situation when he felt weak and tired his blood pressure was very low like 90/60, therefore I will not alter his medications right now   Medication Adjustments/Labs and Tests Ordered: Current medicines are reviewed at length with the patient today.  Concerns regarding medicines are outlined above.  No orders of the defined types were placed in this encounter.  Medication changes: No orders of the defined types were placed in this encounter.   Signed, Park Liter, MD, [facility] 10/15/2021 3:53 PM    [place]

## 2021-10-15 NOTE — Patient Instructions (Addendum)
Medication Instructions:  Hold Lasix Morning of CT   Metoprolol '75mg'$  2 hours prior to the CT Scan  *If you need a refill on your cardiac medications before your next appointment, please call your pharmacy*   Lab Work: BMP within one week of CT scan- (10-14-21- CMP completed)  2nd Floor [address]   You need to have labs done when you are fasting.  You can come Monday through Friday 8:00 am to 11:30 am and 1:00 to 4:30.  Thursday and Friday- MORNING HOURS ONLY-You do not need to make an appointment as the order has already been placed. The labs you are going to have done are BMET   Testing/Procedures:   Your cardiac CT will be scheduled at one of the below locations:   [facility] [address] [place], [place] [address] [phone]   At [facility], please arrive at the [facility] and Children's Entrance (Entrance C2) of [facility] 30 minutes prior to test start time. You can use the FREE valet parking offered at entrance C (encouraged to control the heart rate for the test)  Proceed to the [facility] Radiology Department (first floor) to check-in and test prep.  All radiology patients and guests should use entrance C2 at [facility], accessed from [facility], even though the hospital's physical address listed is [address]      Please follow these instructions carefully (unless otherwise directed):  Hold all erectile dysfunction medications at least 3 days (72 hrs) prior to test.  On the Night Before the Test: Be sure to Drink plenty of water. Do not consume any caffeinated/decaffeinated beverages or chocolate 12 hours prior to your test. Do not take any antihistamines 12 hours prior to your test.  On the Day of the Test: Drink plenty of water until 1 hour prior to the test. Do not eat any food 4 hours prior to the test. You may take your regular medications prior to the test.  Take metoprolol  (Lopressor) two hours prior to test. HOLD Furosemide/Hydrochlorothiazide morning of the test.        After the Test: Drink plenty of water. After receiving IV contrast, you may experience a mild flushed feeling. This is normal. On occasion, you may experience a mild rash up to 24 hours after the test. This is not dangerous. If this occurs, you can take Benadryl 25 mg and increase your fluid intake. If you experience trouble breathing, this can be serious. If it is severe call 911 IMMEDIATELY. If it is mild, please call our office. If you take any of these medications: Glipizide/Metformin, Avandament, Glucavance, please do not take 48 hours after completing test unless otherwise instructed.  We will call to schedule your test 2-4 weeks out understanding that some insurance companies will need an authorization prior to the service being performed.   For non-scheduling related questions, please contact the cardiac imaging nurse navigator should you have any questions/concerns: Marchia Bond, Cardiac Imaging Nurse Navigator Gordy Clement, Cardiac Imaging Nurse Navigator [facility] Direct Office Dial: [phone]   For scheduling needs, including cancellations and rescheduling, please call [place], [phone].    Follow-Up: At [facility], you and your health needs are our priority.  As part of our continuing mission to provide you with exceptional heart care, we have created designated Provider Care Teams.  These Care Teams include your primary Cardiologist (physician) and Advanced Practice Providers (APPs -  Physician  Assistants and Nurse Practitioners) who all work together to provide you with the care you need, when you need it.  We recommend signing up for the patient portal called "MyChart".  Sign up information is provided on this After Visit Summary.  MyChart is used to connect with patients for Virtual Visits (Telemedicine).  Patients are able to view  lab/test results, encounter notes, upcoming appointments, etc.  Non-urgent messages can be sent to your provider as well.   To learn more about what you can do with MyChart, go to [email or web address].    Your next appointment:   6 month(s)  The format for your next appointment:   In Person  Provider:   Jenne Campus, MD   Other Instructions Cardiac CT Angiogram A cardiac CT angiogram is a procedure to look at the heart and the area around the heart. It may be done to help find the cause of chest pains or other symptoms of heart disease. During this procedure, a substance called contrast dye is injected into the blood vessels in the area to be checked. A large X-ray machine, called a CT scanner, then takes detailed pictures of the heart and the surrounding area. The procedure is also sometimes called a coronary CT angiogram, coronary artery scanning, or CTA. A cardiac CT angiogram allows the health care provider to see how well blood is flowing to and from the heart. The health care provider will be able to see if there are any problems, such as: Blockage or narrowing of the coronary arteries in the heart. Fluid around the heart. Signs of weakness or disease in the muscles, valves, and tissues of the heart. Tell a health care provider about: Any allergies you have. This is especially important if you have had a previous allergic reaction to contrast dye. All medicines you are taking, including vitamins, herbs, eye drops, creams, and over-the-counter medicines. Any blood disorders you have. Any surgeries you have had. Any medical conditions you have. Whether you are pregnant or may be pregnant. Any anxiety disorders, chronic pain, or other conditions you have that may increase your stress or prevent you from lying still. What are the risks? Generally, this is a safe procedure. However, problems may occur, including: Bleeding. Infection. Allergic reactions to medicines or  dyes. Damage to other structures or organs. Kidney damage from the contrast dye that is used. Increased risk of cancer from radiation exposure. This risk is low. Talk with your health care provider about: The risks and benefits of testing. How you can receive the lowest dose of radiation. What happens before the procedure? Wear comfortable clothing and remove any jewelry, glasses, dentures, and hearing aids. Follow instructions from your health care provider about eating and drinking. This may include: For 12 hours before the procedure -- avoid caffeine. This includes tea, coffee, soda, energy drinks, and diet pills. Drink plenty of water or other fluids that do not have caffeine in them. Being well hydrated can prevent complications. For 4-6 hours before the procedure -- stop eating and drinking. The contrast dye can cause nausea, but this is less likely if your stomach is empty. Ask your health care provider about changing or stopping your regular medicines. This is especially important if you are taking diabetes medicines, blood thinners, or medicines to treat problems with erections (erectile dysfunction). What happens during the procedure?  Hair on your chest may need to be removed so that small sticky patches called electrodes can be placed on your chest. These will transmit  information that helps to monitor your heart during the procedure. An IV will be inserted into one of your veins. You might be given a medicine to control your heart rate during the procedure. This will help to ensure that good images are obtained. You will be asked to lie on an exam table. This table will slide in and out of the CT machine during the procedure. Contrast dye will be injected into the IV. You might feel warm, or you may get a metallic taste in your mouth. You will be given a medicine called nitroglycerin. This will relax or dilate the arteries in your heart. The table that you are lying on will move into  the CT machine tunnel for the scan. The person running the machine will give you instructions while the scans are being done. You may be asked to: Keep your arms above your head. Hold your breath. Stay very still, even if the table is moving. When the scanning is complete, you will be moved out of the machine. The IV will be removed. The procedure may vary among health care providers and hospitals. What can I expect after the procedure? After your procedure, it is common to have: A metallic taste in your mouth from the contrast dye. A feeling of warmth. A headache from the nitroglycerin. Follow these instructions at home: Take over-the-counter and prescription medicines only as told by your health care provider. If you are told, drink enough fluid to keep your urine pale yellow. This will help to flush the contrast dye out of your body. Most people can return to their normal activities right after the procedure. Ask your health care provider what activities are safe for you. It is up to you to get the results of your procedure. Ask your health care provider, or the department that is doing the procedure, when your results will be ready. Keep all follow-up visits as told by your health care provider. This is important. Contact a health care provider if: You have any symptoms of allergy to the contrast dye. These include: Shortness of breath. Rash or hives. A racing heartbeat. Summary A cardiac CT angiogram is a procedure to look at the heart and the area around the heart. It may be done to help find the cause of chest pains or other symptoms of heart disease. During this procedure, a large X-ray machine, called a CT scanner, takes detailed pictures of the heart and the surrounding area after a contrast dye has been injected into blood vessels in the area. Ask your health care provider about changing or stopping your regular medicines before the procedure. This is especially important if you  are taking diabetes medicines, blood thinners, or medicines to treat erectile dysfunction. If you are told, drink enough fluid to keep your urine pale yellow. This will help to flush the contrast dye out of your body. This information is not intended to replace advice given to you by your health care provider. Make sure you discuss any questions you have with your health care provider. Document Revised: 10/11/2018 Document Reviewed: 10/11/2018 Elsevier Patient Education  [place].

## 2021-10-22 ENCOUNTER — Encounter: Payer: Self-pay | Admitting: Physician Assistant

## 2021-10-28 ENCOUNTER — Other Ambulatory Visit: Payer: Self-pay | Admitting: Family Medicine

## 2021-10-28 DIAGNOSIS — N401 Enlarged prostate with lower urinary tract symptoms: Secondary | ICD-10-CM

## 2021-10-29 DIAGNOSIS — I2510 Atherosclerotic heart disease of native coronary artery without angina pectoris: Secondary | ICD-10-CM | POA: Diagnosis not present

## 2021-10-29 DIAGNOSIS — E785 Hyperlipidemia, unspecified: Secondary | ICD-10-CM | POA: Diagnosis not present

## 2021-10-29 DIAGNOSIS — I509 Heart failure, unspecified: Secondary | ICD-10-CM

## 2021-10-30 DIAGNOSIS — N529 Male erectile dysfunction, unspecified: Secondary | ICD-10-CM | POA: Insufficient documentation

## 2021-11-04 ENCOUNTER — Ambulatory Visit (INDEPENDENT_AMBULATORY_CARE_PROVIDER_SITE_OTHER): Payer: Medicare PPO

## 2021-11-04 DIAGNOSIS — E291 Testicular hypofunction: Secondary | ICD-10-CM | POA: Diagnosis not present

## 2021-11-04 NOTE — Progress Notes (Signed)
   Testosterone injection given per order, patient tolerated well.   Erie Noe, LPN 3:57 PM

## 2021-11-09 ENCOUNTER — Encounter: Payer: Self-pay | Admitting: Family Medicine

## 2021-11-09 ENCOUNTER — Ambulatory Visit (INDEPENDENT_AMBULATORY_CARE_PROVIDER_SITE_OTHER): Payer: Medicare PPO | Admitting: Family Medicine

## 2021-11-09 ENCOUNTER — Other Ambulatory Visit: Payer: Self-pay | Admitting: Family Medicine

## 2021-11-09 VITALS — BP 114/64 | HR 66 | Temp 97.5°F | Resp 14 | Ht 70.0 in | Wt 206.0 lb

## 2021-11-09 DIAGNOSIS — E291 Testicular hypofunction: Secondary | ICD-10-CM

## 2021-11-09 DIAGNOSIS — E034 Atrophy of thyroid (acquired): Secondary | ICD-10-CM

## 2021-11-09 DIAGNOSIS — Z23 Encounter for immunization: Secondary | ICD-10-CM

## 2021-11-09 DIAGNOSIS — R413 Other amnesia: Secondary | ICD-10-CM

## 2021-11-09 DIAGNOSIS — I70213 Atherosclerosis of native arteries of extremities with intermittent claudication, bilateral legs: Secondary | ICD-10-CM | POA: Diagnosis not present

## 2021-11-09 DIAGNOSIS — N401 Enlarged prostate with lower urinary tract symptoms: Secondary | ICD-10-CM | POA: Diagnosis not present

## 2021-11-09 DIAGNOSIS — I5083 High output heart failure: Secondary | ICD-10-CM | POA: Diagnosis not present

## 2021-11-09 DIAGNOSIS — I130 Hypertensive heart and chronic kidney disease with heart failure and stage 1 through stage 4 chronic kidney disease, or unspecified chronic kidney disease: Secondary | ICD-10-CM | POA: Diagnosis not present

## 2021-11-09 DIAGNOSIS — R42 Dizziness and giddiness: Secondary | ICD-10-CM

## 2021-11-09 DIAGNOSIS — D509 Iron deficiency anemia, unspecified: Secondary | ICD-10-CM | POA: Diagnosis not present

## 2021-11-09 DIAGNOSIS — R351 Nocturia: Secondary | ICD-10-CM

## 2021-11-09 DIAGNOSIS — E782 Mixed hyperlipidemia: Secondary | ICD-10-CM | POA: Diagnosis not present

## 2021-11-09 NOTE — Assessment & Plan Note (Signed)
Check psa 

## 2021-11-09 NOTE — Assessment & Plan Note (Signed)
Well controlled.  ?No changes to medicines.  ?Continue to work on eating a healthy diet and exercise.  ?Labs drawn today.  ?

## 2021-11-09 NOTE — Progress Notes (Signed)
Subjective:  Patient ID: Mark Mejia, male    DOB: [birth year]  Age: 66 y.o. MRN: [id number]  Chief Complaint  Patient presents with   Hypertension   Hypothyroidism   Hyperlipidemia    HPI  Hypertension: Checking bp daily. Patient was seen on 10/14/2021 by Mark Mejia, PAC. Patient was seen for dizziness and low bp. She held his amlodipine the day he was seen. Since then if bp is over 140, he will take amlodipine. He has essentially is taking daily other than the first 3 days after seeing Mark Mejia. ON aspirin 81 mg daily, Metoprolol 25 mg twice a day. Stat labs showed troponin I normal.  Hb low at 13.8. MCV 81.   Hypothyroidism: Takes Levothyroxine 88 mcg daily. TSH 2.06 done at [facility].   Hyperlipidemia: Rosuvastatin 20 mg daily.  GERD: Omeprazole 40 mg daily.  Hypogonadism male: Testosterone cypionate 200 mg every 14 days.   BPH: Tamsulosin 0.4 mg take 2 tablets twice a day.  Current Outpatient Medications on File Prior to Visit  Medication Sig Dispense Refill   amLODipine (NORVASC) 5 MG tablet TAKE 1 TABLET EVERY DAY 90 tablet 0   aspirin EC 81 MG tablet Take 81 mg by mouth daily.      dorzolamide-timolol (COSOPT) 22.3-6.8 MG/ML ophthalmic solution Place 1 drop into the right eye 2 (two) times daily.     eszopiclone (LUNESTA) 2 MG TABS tablet Take 1 tablet (2 mg total) by mouth at bedtime as needed for sleep. Take immediately before bedtime 30 tablet 2   Glycerin-Hypromellose-PEG 400 ([facility] DRY EYE RELIEF OP) Place 2 drops into both eyes daily as needed (for dry eyes).     HYDROcodone-acetaminophen (NORCO/VICODIN) 5-325 MG tablet Take 1 tablet by mouth every 6 (six) hours as needed for moderate pain. 30 tablet 0   latanoprost (XALATAN) 0.005 % ophthalmic solution Place 1 drop into the right eye at bedtime.     levothyroxine (SYNTHROID) 88 MCG tablet Take 1 tablet (88 mcg total) by mouth daily before breakfast. 90 tablet 0   meloxicam (MOBIC) 7.5 MG tablet TAKE 1 TABLET TWICE DAILY 180  tablet 1   metoprolol tartrate (LOPRESSOR) 25 MG tablet Take 1 tablet (25 mg total) by mouth 2 (two) times daily. 180 tablet 3   nitroGLYCERIN (NITROSTAT) 0.4 MG SL tablet Place 1 tablet (0.4 mg total) under the tongue every 5 (five) minutes as needed for chest pain. 25 tablet 1   omeprazole (PRILOSEC) 40 MG capsule TAKE 1 CAPSULE EVERY DAY 90 capsule 1   rosuvastatin (CRESTOR) 20 MG tablet Take 1 tablet (20 mg total) by mouth daily. 90 tablet 2   sildenafil (VIAGRA) 100 MG tablet Take 100 mg by mouth daily as needed for erectile dysfunction.     tamsulosin (FLOMAX) 0.4 MG CAPS capsule TAKE 2 CAPSULES EVERY DAY 180 capsule 0   Testosterone Cypionate 200 MG/ML SOLN Inject 200 mg as directed every 14 (fourteen) days. 5 mL 2   tiZANidine (ZANAFLEX) 4 MG tablet TAKE 1 TABLET EVERY 6 HOURS AS NEEDED FOR MUSCLE SPASM(S) 90 tablet 1   Vitamin D, Ergocalciferol, (DRISDOL) 1.25 MG (50000 UNIT) CAPS capsule TAKE 1 CAPSULE EVERY SUNDAY. (Patient taking differently: Take 50,000 Units by mouth every 7 (seven) days.) 12 capsule 1   Current Facility-Administered Medications on File Prior to Visit  Medication Dose Route Frequency Provider Last Rate Last Admin   testosterone cypionate (DEPOTESTOSTERONE CYPIONATE) injection 200 mg  200 mg Intramuscular Q14 Days Mark Mejia, Mark Maxwell, MD  200 mg at 11/04/21 1513   Past Medical History:  Diagnosis Date   Angina pectoris ([place]) 05/25/2017   Atrophy of thyroid    Benign prostatic hyperplasia with nocturia 06/14/2019   CAD (coronary artery disease)    Cancer of cervical esophagus ([place]) 12/06/2012   Chest pain of uncertain etiology 3/66/4403   Encounter for adjustment and management of vascular access device 03/10/2016   Encounter for annual wellness exam in Medicare patient 03/10/2016   GERD (gastroesophageal reflux disease)    Hepatitis C    History of esophageal cancer    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[place]) 06/14/2019   Hypertrophic scar of skin 09/29/2016   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   Mixed hyperlipidemia    Peripheral vascular disease, asymptomatic ([place]) 05/23/2017   80-99% stenosis in the left internal coronary artery   Precordial chest pain 12/26/2015   Shortness of breath 12/26/2015   Testicular hypofunction    Vitamin D deficiency    Past Surgical History:  Procedure Laterality Date   GASTROSTOMY W/ FEEDING TUBE     LEFT HEART CATH AND CORONARY ANGIOGRAPHY N/A 05/25/2017   Procedure: LEFT HEART CATH AND CORONARY ANGIOGRAPHY;  Surgeon: Mejia, Mark M, MD;  Location: [place] [facility];  Service: Cardiovascular;  Laterality: N/A;   PORTACATH PLACEMENT     THROAT SURGERY      Family History  Problem Relation Age of Onset   Arthritis Mother    Hypertension Mother    Cancer - Other Maternal Grandmother    Social History   Socioeconomic History   Marital status: Married    Spouse name: Mark Mejia   Number of children: 1   Years of education: college   Highest education level: Not on file  Occupational History   Occupation: Retired  Tobacco Use   Smoking status: Never   Smokeless tobacco: Never  Vaping Use   Vaping Use: Never used  Substance and Sexual Activity   Alcohol use: No   Drug use: No   Sexual activity: Not on file  Other Topics Concern   Not on file  Social History Narrative   Currently retired/disabled   Social Determinants of Health   Financial Resource Strain: Low Risk  (10/01/2021)   Overall Financial Resource Strain (CARDIA)    Difficulty of Paying Living Expenses: Not hard at all  Food Insecurity: No Food Insecurity (02/17/2021)   Hunger Vital Sign    Worried About Running Out of Food in the Last Year: Never true    [place] in the Last Year: Never true  Transportation Needs: No Transportation Needs (10/01/2021)   PRAPARE - Hydrologist (Medical): No    Lack of Transportation (Non-Medical): No   Physical Activity: Not on file  Stress: Not on file  Social Connections: Not on file    Review of Systems  Constitutional:  Negative for chills, fatigue, fever and unexpected weight change.  HENT:  Negative for congestion, ear pain, sinus pain and sore throat.   Respiratory:  Negative for cough and shortness of breath.   Cardiovascular:  Negative for chest pain and palpitations.  Gastrointestinal:  Negative for abdominal pain, blood in stool, constipation, diarrhea, nausea and vomiting.  Endocrine: Negative for polydipsia.  Genitourinary:  Negative for dysuria.  Musculoskeletal:  Negative for back pain.  Skin:  Negative for rash.  Neurological:  Negative for dizziness and headaches.  Objective:  BP 114/64   Pulse 66   Temp (!) 97.5 F (36.4 C)   Resp 14   Ht '5\' 10"'$  (1.778 m)   Wt 206 lb (93.4 kg)   SpO2 97%   BMI 29.56 kg/m      11/09/2021    3:14 PM 10/15/2021    3:31 PM 10/14/2021    9:20 AM  BP/Weight  Systolic BP 161 096 045  Diastolic BP 64 78 58  Wt. (Lbs) 206 206.04 205  BMI 29.56 kg/m2 29.56 kg/m2 28.59 kg/m2    Physical Exam Vitals reviewed.  Constitutional:      Appearance: Normal appearance.  Neck:     Vascular: No carotid bruit.  Cardiovascular:     Rate and Rhythm: Normal rate and regular rhythm.     Pulses: Normal pulses.     Heart sounds: Normal heart sounds.  Pulmonary:     Effort: Pulmonary effort is normal.     Breath sounds: Normal breath sounds. No wheezing, rhonchi or rales.  Abdominal:     General: Bowel sounds are normal.     Palpations: Abdomen is soft.     Tenderness: There is no abdominal tenderness.  Neurological:     Mental Status: He is alert.  Psychiatric:        Mood and Affect: Mood normal.        Behavior: Behavior normal.     Diabetic Foot Exam - Simple   No data filed      Lab Results  Component Value Date   WBC 4.3 10/14/2021   HGB 13.8 10/14/2021   HCT 41 10/14/2021   PLT 137 (A) 10/14/2021   GLUCOSE  91 09/03/2021   CHOL 75 (L) 08/04/2021   TRIG 58 08/04/2021   HDL 26 (L) 08/04/2021   LDLCALC 35 08/04/2021   ALT 29 10/14/2021   AST 40 10/14/2021   NA 138 10/14/2021   K 4.0 10/14/2021   CL 101 10/14/2021   CREATININE 1.1 10/14/2021   BUN 13 10/14/2021   CO2 29 (A) 10/14/2021   TSH 6.480 (H) 08/04/2021   INR 1.1 05/23/2017      Assessment & Plan:   Problem List Items Addressed This Visit       Cardiovascular and Mediastinum   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  No changes to medicines. Continue amlodipine 5 mg daily and metoprolol 25 mg twice daily   Continue to work on eating a healthy diet and exercise.  Labs drawn today.        Relevant Orders   Comprehensive metabolic panel   CBC with Differential/Platelet   Atherosclerosis of native artery of both lower extremities with intermittent claudication ([place])    The current medical regimen is effective;  continue present plan and medications.         Endocrine   Atrophy of thyroid    Previously well controlled Continue Synthroid at current dose  Recheck TSH and adjust Synthroid as indicated        Male hypogonadism    Check Testosterone levels.      Relevant Orders   Testosterone,Free and Total     Other   Mixed hyperlipidemia    Well controlled.  No changes to medicines.  Continue to work on eating a healthy diet and exercise.  Labs drawn today.       Relevant Orders   Lipid panel  Benign prostatic hyperplasia with nocturia    Check psa       Relevant Orders   PSA   Need for immunization against influenza    Fluad given.      Relevant Orders   Flu Vaccine QUAD High Dose(Fluad) (Completed)   Dizziness    Resolved. Was likely due to dehydration.      RESOLVED: Memory loss   Other Visit Diagnoses     Microcytic anemia       Relevant Orders   Iron, TIBC and Ferritin Panel     .  No orders of the  defined types were placed in this encounter.   Orders Placed This Encounter  Procedures   Flu Vaccine QUAD High Dose(Fluad)   Comprehensive metabolic panel   Lipid panel   CBC with Differential/Platelet   PSA   Testosterone,Free and Total   Iron, TIBC and Ferritin Panel     Follow-up: Return in about 3 months (around 02/08/2022) for chronic fasting.  An After Visit Summary was printed and given to the patient.  Rochel Brome, MD [facility] [phone]

## 2021-11-09 NOTE — Assessment & Plan Note (Signed)
Fluad given.

## 2021-11-09 NOTE — Assessment & Plan Note (Signed)
Well controlled.  No changes to medicines. Continue amlodipine 5 mg daily and metoprolol 25 mg twice daily   Continue to work on eating a healthy diet and exercise.  Labs drawn today.

## 2021-11-09 NOTE — Assessment & Plan Note (Signed)
The current medical regimen is effective;  continue present plan and medications.  

## 2021-11-09 NOTE — Assessment & Plan Note (Signed)
Check Testosterone levels.

## 2021-11-09 NOTE — Assessment & Plan Note (Signed)
Resolved. Was likely due to dehydration.

## 2021-11-09 NOTE — Assessment & Plan Note (Signed)
Previously well controlled Continue Synthroid at current dose  Recheck TSH and adjust Synthroid as indicated   

## 2021-11-11 ENCOUNTER — Other Ambulatory Visit: Payer: Self-pay

## 2021-11-11 MED ORDER — TESTOSTERONE CYPIONATE 200 MG/ML IJ SOLN: 200.0000 mg | INTRAMUSCULAR | 2 refills | Status: DC

## 2021-11-12 ENCOUNTER — Telehealth: Payer: Self-pay

## 2021-11-12 NOTE — Chronic Care Management (AMB) (Signed)
Chronic Care Management Pharmacy Assistant   Name: Mark Mejia  MRN: [id number] DOB: 08-02-1955  Reason for Encounter: Disease State/ Hypertension  Recent office visits:  11-09-2021 Rochel Brome, MD. Flu vaccine given.  11-04-2021 Erie Noe, LPN. Testosterone injection given.  10-14-2021 Marge Duncans, PA-C. STOP amlodipine and doxycycline. EKG completed.  10-07-2021 Oleta Mouse, CMA. Testosterone injection given.  Recent consult visits:  10-15-2021 Park Liter, MD (Cardiology). CT coronary morph w/ CTA COR w/score ordered.  Hospital visits:  Medication Reconciliation was completed by comparing discharge summary, patient's EMR and Pharmacy list, and upon discussion with patient.  Admitted to the hospital on 09-03-2021 due to Community acquired pneumonia. Discharge date was 09-03-2021. Discharged from [place]?Medications Started at [facility] Discharge:?? Amoxicillin 1,000 mg twice daily Doxycycline 100 mg twice daily  Medication Changes at Hospital Discharge: None  Medications Discontinued at Hospital Discharge: None  Medications that remain the same after Hospital Discharge:??  -All other medications will remain the same.    Medications: Outpatient Encounter Medications as of 11/12/2021  Medication Sig   amLODipine (NORVASC) 5 MG tablet TAKE 1 TABLET EVERY DAY   aspirin EC 81 MG tablet Take 81 mg by mouth daily.    dorzolamide-timolol (COSOPT) 22.3-6.8 MG/ML ophthalmic solution Place 1 drop into the right eye 2 (two) times daily.   eszopiclone (LUNESTA) 2 MG TABS tablet Take 1 tablet (2 mg total) by mouth at bedtime as needed for sleep. Take immediately before bedtime   furosemide (LASIX) 20 MG tablet TAKE 1 TABLET EVERY DAY   Glycerin-Hypromellose-PEG 400 ([facility] DRY EYE RELIEF OP) Place 2 drops into both eyes daily as needed (for dry eyes).   HYDROcodone-acetaminophen (NORCO/VICODIN) 5-325 MG tablet Take 1 tablet by mouth every 6  (six) hours as needed for moderate pain.   latanoprost (XALATAN) 0.005 % ophthalmic solution Place 1 drop into the right eye at bedtime.   levothyroxine (SYNTHROID) 88 MCG tablet Take 1 tablet (88 mcg total) by mouth daily before breakfast.   meloxicam (MOBIC) 7.5 MG tablet TAKE 1 TABLET TWICE DAILY   metoprolol tartrate (LOPRESSOR) 25 MG tablet Take 1 tablet (25 mg total) by mouth 2 (two) times daily.   nitroGLYCERIN (NITROSTAT) 0.4 MG SL tablet Place 1 tablet (0.4 mg total) under the tongue every 5 (five) minutes as needed for chest pain.   omeprazole (PRILOSEC) 40 MG capsule TAKE 1 CAPSULE EVERY DAY   rosuvastatin (CRESTOR) 20 MG tablet Take 1 tablet (20 mg total) by mouth daily.   sildenafil (VIAGRA) 100 MG tablet Take 100 mg by mouth daily as needed for erectile dysfunction.   tamsulosin (FLOMAX) 0.4 MG CAPS capsule TAKE 2 CAPSULES EVERY DAY   Testosterone Cypionate 200 MG/ML SOLN Inject 200 mg as directed every 14 (fourteen) days.   tiZANidine (ZANAFLEX) 4 MG tablet TAKE 1 TABLET EVERY 6 HOURS AS NEEDED FOR MUSCLE SPASM(S)   Vitamin D, Ergocalciferol, (DRISDOL) 1.25 MG (50000 UNIT) CAPS capsule TAKE 1 CAPSULE EVERY SUNDAY. (Patient taking differently: Take 50,000 Units by mouth every 7 (seven) days.)   No facility-administered encounter medications on file as of 11/12/2021.   Recent Office Vitals: BP Readings from Last 3 Encounters:  11/09/21 114/64  10/15/21 (!) 148/78  10/14/21 (!) 100/58   Pulse Readings from Last 3 Encounters:  11/09/21 66  10/15/21 68  10/14/21 65    Wt Readings from Last 3 Encounters:  11/09/21 206 lb (93.4 kg)  10/15/21 206 lb  0.6 oz (93.5 kg)  10/14/21 205 lb (93 kg)     Kidney Function Lab Results  Component Value Date/Time   CREATININE 1.1 10/14/2021 12:00 AM   CREATININE 1.30 (H) 09/03/2021 01:19 PM   CREATININE 1.20 08/04/2021 09:59 AM   GFRNONAA >60 09/03/2021 01:19 PM   GFRAA 74 12/14/2019 10:50 AM       Latest Ref Rng & Units 10/14/2021    12:00 AM 09/03/2021    1:19 PM 08/04/2021    9:59 AM  BMP  Glucose 70 - 99 mg/dL  91  82   BUN 4 - '21 13     12  10   '$ Creatinine 0.6 - 1.3 1.1     1.30  1.20   BUN/Creat Ratio 10 - 24   8   Sodium 137 - 147 138     136  139   Potassium 3.5 - 5.1 mEq/L 4.0     4.0  4.0   Chloride 99 - 108 101     103  103   CO2 13 - '22 29     25  21   '$ Calcium 8.7 - 10.7 12.0     9.5  9.5      This result is from an external source.     Current antihypertensive regimen:  Amlodipine 5 mg  daily (patient stated he was to stop medication since BP was low and to start back once BP got back normal. Patient is now back taking medication) Metoprolol Tart 25 mg BID   Patient verbally confirms he is taking the above medications as directed. Yes  How often are you checking your Blood Pressure? daily  he checks his blood pressure in the morning before taking his medication.  Current home BP readings: 09-14 133/78 68, 09-13 133/76 66, 09-12 137/77 67 Wrist or arm cuff: Arm cuff Caffeine intake: Patient stated he drinks soda/tea but not often Salt intake: Limited OTC medications including pseudoephedrine or NSAIDs? None  Any readings above 180/120? No  What recent interventions/DTPs have been made by any provider to improve Blood Pressure control since last CPP Visit:  Educated on BP goals and benefits of medications for prevention of heart attack, stroke and kidney damage; -Counseled to monitor BP at home weekly, document, and provide log at future appointments -December 2022: Updated care plan -Recommended to continue current medication.   Any recent hospitalizations or ED visits since last visit with CPP? No  What diet changes have been made to improve Blood Pressure Control?  Patient stated he limits his sodium intake and drinks plenty of water daily.  What exercise is being done to improve your Blood Pressure Control?  Patient stated he walks some days.  Adherence Review: Is the patient  currently on ACE/ARB medication? No Does the patient have >5 day gap between last estimated fill dates? CPP to review  Care Gaps: Last annual wellness visit? None Shingrix overdue PNA Vac overdue Tdap overdue Covid booster overdue  Star Rating Drugs: Rosuvastatin 20 mg- Last filled 09-24-2021 90 DS  [place] Clinical Pharmacist Assistant [phone]

## 2021-11-13 ENCOUNTER — Other Ambulatory Visit: Payer: Self-pay

## 2021-11-13 MED ORDER — TESTOSTERONE CYPIONATE 200 MG/ML IJ SOLN: 200.0000 mg | INTRAMUSCULAR | 2 refills | Status: DC

## 2021-11-15 LAB — COMPREHENSIVE METABOLIC PANEL
ALT: 23 IU/L (ref 0–44)
AST: 31 IU/L (ref 0–40)
Albumin/Globulin Ratio: 1.5 (ref 1.2–2.2)
Albumin: 4.8 g/dL (ref 3.9–4.9)
Alkaline Phosphatase: 73 IU/L (ref 44–121)
BUN/Creatinine Ratio: 10 (ref 10–24)
BUN: 11 mg/dL (ref 8–27)
Bilirubin Total: 0.5 mg/dL (ref 0.0–1.2)
CO2: 19 mmol/L — ABNORMAL LOW (ref 20–29)
Calcium: 9.5 mg/dL (ref 8.6–10.2)
Chloride: 104 mmol/L (ref 96–106)
Creatinine, Ser: 1.08 mg/dL (ref 0.76–1.27)
Globulin, Total: 3.1 g/dL (ref 1.5–4.5)
Glucose: 74 mg/dL (ref 70–99)
Potassium: 4.1 mmol/L (ref 3.5–5.2)
Sodium: 140 mmol/L (ref 134–144)
Total Protein: 7.9 g/dL (ref 6.0–8.5)
eGFR: 75 mL/min/{1.73_m2} (ref >59)

## 2021-11-15 LAB — CBC WITH DIFFERENTIAL/PLATELET
Basophils Absolute: 0.1 10*3/uL (ref 0.0–0.2)
Basos: 1 %
EOS (ABSOLUTE): 0.1 10*3/uL (ref 0.0–0.4)
Eos: 3 %
Hematocrit: 42.5 % (ref 37.5–51.0)
Hemoglobin: 13.9 g/dL (ref 13.0–17.7)
Immature Grans (Abs): 0 10*3/uL (ref 0.0–0.1)
Immature Granulocytes: 0 %
Lymphocytes Absolute: 2 10*3/uL (ref 0.7–3.1)
Lymphs: 45 %
MCH: 26.5 pg — ABNORMAL LOW (ref 26.6–33.0)
MCHC: 32.7 g/dL (ref 31.5–35.7)
MCV: 81 fL (ref 79–97)
Monocytes Absolute: 0.3 10*3/uL (ref 0.1–0.9)
Monocytes: 7 %
Neutrophils Absolute: 2 10*3/uL (ref 1.4–7.0)
Neutrophils: 44 %
Platelets: 141 10*3/uL — ABNORMAL LOW (ref 150–450)
RBC: 5.24 x10E6/uL (ref 4.14–5.80)
RDW: 14.3 % (ref 11.6–15.4)
WBC: 4.5 10*3/uL (ref 3.4–10.8)

## 2021-11-15 LAB — IRON,TIBC AND FERRITIN PANEL
Ferritin: 41 ng/mL (ref 30–400)
Iron Saturation: 21 % (ref 15–55)
Iron: 80 ug/dL (ref 38–169)
Total Iron Binding Capacity: 378 ug/dL (ref 250–450)
UIBC: 298 ug/dL (ref 111–343)

## 2021-11-15 LAB — LIPID PANEL
Chol/HDL Ratio: 3.6 ratio (ref 0.0–5.0)
Cholesterol, Total: 114 mg/dL (ref 100–199)
HDL: 32 mg/dL — ABNORMAL LOW (ref >39)
LDL Chol Calc (NIH): 64 mg/dL (ref 0–99)
Triglycerides: 92 mg/dL (ref 0–149)
VLDL Cholesterol Cal: 18 mg/dL (ref 5–40)

## 2021-11-15 LAB — TESTOSTERONE,FREE AND TOTAL
Testosterone, Free: 22.3 pg/mL — ABNORMAL HIGH (ref 6.6–18.1)
Testosterone: 966 ng/dL — ABNORMAL HIGH (ref 264–916)

## 2021-11-15 LAB — PSA: Prostate Specific Ag, Serum: 0.6 ng/mL (ref 0.0–4.0)

## 2021-11-15 LAB — CARDIOVASCULAR RISK ASSESSMENT

## 2021-11-15 NOTE — Progress Notes (Signed)
Blood count normal except platelets low. Fairly stable.   Iron studies normal. Liver function normal.  Kidney function normal.  Cholesterol: good PSA normal. Testosterone levels too high. Decrease injection to 150 mg every 2 weeks.

## 2021-11-17 ENCOUNTER — Ambulatory Visit (INDEPENDENT_AMBULATORY_CARE_PROVIDER_SITE_OTHER): Payer: Medicare PPO

## 2021-11-17 DIAGNOSIS — Z0000 Encounter for general adult medical examination without abnormal findings: Secondary | ICD-10-CM

## 2021-11-18 ENCOUNTER — Ambulatory Visit (INDEPENDENT_AMBULATORY_CARE_PROVIDER_SITE_OTHER): Payer: Medicare PPO

## 2021-11-18 DIAGNOSIS — E291 Testicular hypofunction: Secondary | ICD-10-CM | POA: Diagnosis not present

## 2021-11-18 MED ORDER — TESTOSTERONE CYPIONATE 200 MG/ML IM SOLN
200.0000 mg | INTRAMUSCULAR | Status: DC
  Administered 2021-11-18 – 2022-07-28 (×6): 200 mg via INTRAMUSCULAR

## 2021-11-18 NOTE — Progress Notes (Signed)
   Testosterone injection given per order, patient tolerated well.   Erie Noe, LPN 0:34 PM

## 2021-11-20 ENCOUNTER — Telehealth (HOSPITAL_COMMUNITY): Payer: Self-pay | Admitting: *Deleted

## 2021-11-20 ENCOUNTER — Telehealth (HOSPITAL_COMMUNITY): Payer: Self-pay | Admitting: Emergency Medicine

## 2021-11-20 NOTE — Telephone Encounter (Signed)
Attempted to call patient regarding upcoming cardiac CT appointment. °Left message on voicemail with name and callback number °Bon Dowis RN Navigator Cardiac Imaging °[facility] °[phone] Office °[phone] Cell ° °

## 2021-11-20 NOTE — Telephone Encounter (Signed)
Reaching out to patient to offer assistance regarding upcoming cardiac imaging study; pt verbalizes understanding of appt date/time, parking situation and where to check in, medications ordered, and verified current allergies; name and call back number provided for further questions should they arise  Gordy Clement RN Navigator Cardiac Imaging [facility] [phone] office [phone] cell  Patient to take '50mg'$  metoprolol tartrate two hours prior cardiac CT scan. He is aware to arrive at 3:30pm.

## 2021-11-23 ENCOUNTER — Ambulatory Visit (HOSPITAL_COMMUNITY)
Admission: RE | Admit: 2021-11-23 | Discharge: 2021-11-23 | Disposition: A | Discharge status: Home / Self Care | Payer: Medicare PPO | Source: Ambulatory Visit | Attending: Cardiology | Admitting: Cardiology

## 2021-11-23 DIAGNOSIS — M47814 Spondylosis without myelopathy or radiculopathy, thoracic region: Secondary | ICD-10-CM | POA: Insufficient documentation

## 2021-11-23 DIAGNOSIS — R079 Chest pain, unspecified: Secondary | ICD-10-CM | POA: Diagnosis not present

## 2021-11-23 DIAGNOSIS — I2510 Atherosclerotic heart disease of native coronary artery without angina pectoris: Secondary | ICD-10-CM | POA: Insufficient documentation

## 2021-11-23 DIAGNOSIS — I700 Atherosclerosis of aorta: Secondary | ICD-10-CM | POA: Diagnosis not present

## 2021-11-23 MED ORDER — NITROGLYCERIN 0.4 MG SL SUBL
SUBLINGUAL_TABLET | SUBLINGUAL | Status: AC
  Filled 2021-11-23: qty 2

## 2021-11-23 MED ORDER — NITROGLYCERIN 0.4 MG SL SUBL
0.8000 mg | SUBLINGUAL_TABLET | Freq: Once | SUBLINGUAL | Status: AC
  Administered 2021-11-23: 0.8 mg via SUBLINGUAL

## 2021-11-23 MED ORDER — IOHEXOL 350 MG/ML SOLN
95.0000 mL | Freq: Once | INTRAVENOUS | Status: AC | PRN
  Administered 2021-11-23: 95 mL via INTRAVENOUS

## 2021-11-23 NOTE — Progress Notes (Signed)
Subjective:   Mark Mejia is a 66 y.o. male who presents for Medicare Annual/Subsequent preventive examination. I connected with  Mark Mejia on 11/23/21 by a audio enabled telemedicine application and verified that I am speaking with the correct person using two identifiers.  Patient Location: Home  Provider Location: Office/Clinic  I discussed the limitations of evaluation and management by telemedicine. The patient expressed understanding and agreed to proceed.   Cardiac Risk Factors include: advanced age (>32mn, >>41women);male gender     Objective:    There were no vitals filed for this visit. There is no height or weight on file to calculate BMI.     11/20/2021    9:41 AM 09/03/2021   12:56 PM 02/17/2021   11:16 AM 01/29/2021    7:57 PM 01/01/2020    3:21 PM 06/13/2019   10:45 AM 05/05/2018    9:49 AM  Advanced Directives  Does Patient Have a Medical Advance Directive? Yes Yes Yes Yes Yes Yes No  Type of ACorporate treasurerof [place]iver [place]Living will [place] will [place] will Living will [place] will   Does patient want to make changes to medical advance directive? No - Patient declined   No - Patient declined Yes (Inpatient - patient defers changing a medical advance directive and declines information at this time)    Copy of [place] Chart?    Yes - validated most recent copy scanned in chart (See row information)     Would patient like information on creating a medical advance directive?       No - Patient declined    Current Medications (verified) Outpatient Encounter Medications as of 11/17/2021  Medication Sig   amLODipine (NORVASC) 5 MG tablet TAKE 1 TABLET EVERY DAY   aspirin EC 81 MG tablet Take 81 mg by mouth daily.    dorzolamide-timolol (COSOPT) 22.3-6.8 MG/ML ophthalmic solution Place 1 drop into the right eye 2 (two) times daily.    eszopiclone (LUNESTA) 2 MG TABS tablet Take 1 tablet (2 mg total) by mouth at bedtime as needed for sleep. Take immediately before bedtime   furosemide (LASIX) 20 MG tablet TAKE 1 TABLET EVERY DAY   Glycerin-Hypromellose-PEG 400 ([facility] DRY EYE RELIEF OP) Place 2 drops into both eyes daily as needed (for dry eyes).   HYDROcodone-acetaminophen (NORCO/VICODIN) 5-325 MG tablet Take 1 tablet by mouth every 6 (six) hours as needed for moderate pain.   latanoprost (XALATAN) 0.005 % ophthalmic solution Place 1 drop into the right eye at bedtime.   levothyroxine (SYNTHROID) 88 MCG tablet Take 1 tablet (88 mcg total) by mouth daily before breakfast.   meloxicam (MOBIC) 7.5 MG tablet TAKE 1 TABLET TWICE DAILY   nitroGLYCERIN (NITROSTAT) 0.4 MG SL tablet Place 1 tablet (0.4 mg total) under the tongue every 5 (five) minutes as needed for chest pain.   omeprazole (PRILOSEC) 40 MG capsule TAKE 1 CAPSULE EVERY DAY   rosuvastatin (CRESTOR) 20 MG tablet Take 1 tablet (20 mg total) by mouth daily.   sildenafil (VIAGRA) 100 MG tablet Take 100 mg by mouth daily as needed for erectile dysfunction.   tamsulosin (FLOMAX) 0.4 MG CAPS capsule TAKE 2 CAPSULES EVERY DAY   Testosterone Cypionate 200 MG/ML SOLN Inject 200 mg as directed every 14 (fourteen) days.   tiZANidine (ZANAFLEX) 4 MG tablet TAKE 1 TABLET EVERY 6 HOURS AS NEEDED FOR MUSCLE SPASM(S)   Vitamin  D, Ergocalciferol, (DRISDOL) 1.25 MG (50000 UNIT) CAPS capsule TAKE 1 CAPSULE EVERY SUNDAY. (Patient taking differently: Take 50,000 Units by mouth every 7 (seven) days.)   metoprolol tartrate (LOPRESSOR) 25 MG tablet Take 1 tablet (25 mg total) by mouth 2 (two) times daily.   No facility-administered encounter medications on file as of 11/17/2021.    Allergies (verified) Patient has no known allergies.   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   Hypertrophic scar of skin 09/29/2016   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   Mixed hyperlipidemia    Peripheral vascular disease, asymptomatic ([place]) 05/23/2017   80-99% stenosis in the left internal coronary artery   Precordial chest pain 12/26/2015   Shortness of breath 12/26/2015   Testicular hypofunction    Vitamin D deficiency    Past Surgical History:  Procedure Laterality Date   GASTROSTOMY W/ FEEDING TUBE     LEFT HEART CATH AND CORONARY ANGIOGRAPHY N/A 05/25/2017   Procedure: LEFT HEART CATH AND CORONARY ANGIOGRAPHY;  Surgeon: Martinique, Peter M, MD;  Location: [place] [facility];  Service: Cardiovascular;  Laterality: N/A;   PORTACATH PLACEMENT     THROAT SURGERY     Family History  Problem Relation Age of Onset   Arthritis Mother    Hypertension Mother    Cancer - Other Maternal Grandmother    Social History   Socioeconomic History   Marital status: Married    Spouse name: Mark Mejia   Number of children: 1   Years of education: college   Highest education level: Not on file  Occupational History   Occupation: Retired  Tobacco Use   Smoking status: Never   Smokeless tobacco: Never  Vaping Use   Vaping Use: Never used  Substance and Sexual Activity   Alcohol use: No   Drug use: No   Sexual activity: Not on file  Other Topics Concern   Not on file  Social History Narrative   Currently retired/disabled   Social Determinants of Health   Financial Resource Strain: Low Risk  (10/01/2021)    Overall Financial Resource Strain (CARDIA)    Difficulty of Paying Living Expenses: Not hard at all  Food Insecurity: No Food Insecurity (02/17/2021)   Hunger Vital Sign    Worried About Running Out of Food in the Last Year: Never true    [place] in the Last Year: Never true  Transportation Needs: No Transportation Needs (10/01/2021)   PRAPARE - Hydrologist (Medical): No    Lack of Transportation (Non-Medical): No  Physical Activity: Not on file  Stress: Not on file  Social Connections: Not on file    Tobacco Counseling Counseling given: Not Answered   Clinical Intake:  Pre-visit preparation completed: Yes Pain : No/denies pain   Nutritional Risks: None Diabetes: No How often do you need to have someone  help you when you read instructions, pamphlets, or other written materials from your doctor or pharmacy?: 1 - Never Interpreter Needed?: No    Activities of Daily Living    11/20/2021    9:42 AM  In your present state of health, do you have any difficulty performing the following activities:  Hearing? 0  Vision? 0  Difficulty concentrating or making decisions? 0  Walking or climbing stairs? 1  Dressing or bathing? 0  Doing errands, shopping? 0  Preparing Food and eating ? N  Using the Toilet? N  In the past six months, have you accidently leaked urine? N  Do you have problems with loss of bowel control? N  Managing your Medications? N  Managing your Finances? N  Housekeeping or managing your Housekeeping? N    Patient Care Team: Rochel Brome, MD as PCP - General (Internal Medicine) Park Liter, MD as Consulting Physician (Cardiology) Lane Hacker, [facility] as Consulting Physician (Pharmacist)     Assessment:   This is a routine wellness examination for [place].  Hearing/Vision screen No results found.  Dietary issues and exercise activities discussed: Current Exercise Habits: Home exercise routine, Type of  exercise: walking, Time (Minutes): 30, Frequency (Times/Week): 4, Weekly Exercise (Minutes/Week): 120, Intensity: Mild, Exercise limited by: None identified   Depression Screen    11/20/2021    9:42 AM 02/17/2021   11:15 AM 01/02/2021    9:04 AM 11/26/2020   11:21 AM 07/03/2020    2:13 PM 01/01/2020    3:15 PM 08/02/2019    3:36 PM  PHQ 2/9 Scores  PHQ - 2 Score 0 0 0 0 0 0 0    Fall Risk    11/20/2021    9:41 AM 02/17/2021   11:14 AM 01/02/2021    9:04 AM 11/26/2020   11:21 AM 07/03/2020    2:12 PM  Fall Risk   Falls in the past year? 0 0 0 0 0  Number falls in past yr: 0 0 0 0 0  Injury with Fall? 0 0 0 0 0  Risk for fall due to : No Fall Risks   No Fall Risks   Follow up Falls evaluation completed;Falls prevention discussed  Falls evaluation completed Falls evaluation completed     FALL RISK PREVENTION PERTAINING TO THE HOME:  Any stairs in or around the home? Yes  If so, are there any without handrails? No  Home free of loose throw rugs in walkways, pet beds, electrical cords, etc? Yes  Adequate lighting in your home to reduce risk of falls? Yes   ASSISTIVE DEVICES UTILIZED TO PREVENT FALLS:  Use of a cane, walker or w/c? No  Grab bars in the bathroom? No  Shower chair or bench in shower? No  Elevated toilet seat or a handicapped toilet? No   Gait slow and steady without use of assistive device  Cognitive Function:    08/04/2021    9:35 AM  MMSE - Mini Mental State Exam  Orientation to time 4  Orientation to Place 5  Registration 3  Attention/ Calculation 5  Recall 3  Language- name 2 objects 2  Language- repeat 1  Language- follow 3 step command 3  Language- read & follow direction 1  Write a sentence 1  Copy design 1  Total score 29        11/20/2021    9:45 AM 01/01/2020    3:18 PM  6CIT Screen  What Year? 0 points 0 points  What month? 0 points 3 points  What time? 0 points 0 points  Count back from 20 0 points 0 points  Months in reverse 2 points 0  points  Repeat phrase 0 points 0 points  Total Score 2 points 3 points    Immunizations Immunization History  Administered Date(s) Administered   Fluad Quad(high Dose 65+) 12/14/2019, 11/26/2020, 11/09/2021   Influenza-Unspecified 12/01/2018   Moderna Covid-19 Vaccine Bivalent Booster 38yr & up 11/26/2020   Moderna Sars-Covid-2 Vaccination 04/27/2019, 05/30/2019, 01/18/2020   Tdap 05/31/2011    TDAP status: Up to date  Flu Vaccine status: Up to date  Pneumococcal vaccine status: Due, Education has been provided regarding the importance of this vaccine. Advised may receive this vaccine at local pharmacy or Health Dept. Aware to provide a copy of the vaccination record if obtained from local pharmacy or Health Dept. Verbalized acceptance and understanding.  Covid-19 vaccine status: Declined, Education has been provided regarding the importance of this vaccine but patient still declined. Advised may receive this vaccine at local pharmacy or Health Dept.or vaccine clinic. Aware to provide a copy of the vaccination record if obtained from local pharmacy or Health Dept. Verbalized acceptance and understanding.  Qualifies for Shingles Vaccine? Yes   Zostavax completed No   Shingrix Completed?: No.    Education has been provided regarding the importance of this vaccine. Patient has been advised to call insurance company to determine out of pocket expense if they have not yet received this vaccine. Advised may also receive vaccine at local pharmacy or Health Dept. Verbalized acceptance and understanding.  Screening Tests Health Maintenance  Topic Date Due   Zoster Vaccines- Shingrix (1 of 2) Never done   Pneumonia Vaccine 67 Years old (1 - PCV) Never done   COVID-19 Vaccine (5 - Moderna risk series) 01/21/2021   TETANUS/TDAP  05/30/2021   COLONOSCOPY (Pts 45-469yrInsurance coverage will need to be confirmed)  01/14/2023   INFLUENZA VACCINE  Completed   Hepatitis C Screening  Completed    HPV VACCINES  Aged Out    Health Maintenance  Health Maintenance Due  Topic Date Due   Zoster Vaccines- Shingrix (1 of 2) Never done   Pneumonia Vaccine 6589Years old (1 - PCV) Never done   COVID-19 Vaccine (5 - Moderna risk series) 01/21/2021   TETANUS/TDAP  05/30/2021    Colorectal cancer screening: Type of screening: Colonoscopy. Completed 01/13/2018. Repeat every 10 years  Lung Cancer Screening: (Low Dose CT Chest recommended if Age 67-80ears, 30 pack-year currently smoking OR have quit w/in 15years.) does not qualify.   Additional Screening:  Vision Screening: Recommended annual ophthalmology exams for early detection of glaucoma and other disorders of the eye. Is the patient up to date with their annual eye exam?  Yes  Who is the provider or what is the name of the office in which the patient attends annual eye exams? [facility] not [facility] with a provider, would they like to be referred to a provider to establish care? No .   Dental Screening: Recommended annual dental exams for proper oral hygiene  Community Resource Referral / Chronic Care Management: CRR required this visit?  No   CCM required this visit?  No      Plan:     I have personally reviewed and noted the following in the patient's chart:   Medical and social history Use of alcohol, tobacco or illicit drugs  Current medications and supplements including opioid  prescriptions.  Functional ability and status Nutritional status Physical activity Advanced directives List of other physicians Hospitalizations, surgeries, and ER visits in previous 12 months Vitals Screenings to include cognitive, depression, and falls Referrals and appointments  In addition, I have reviewed and discussed with patient certain preventive protocols, quality metrics, and best practice recommendations. A written personalized care plan for preventive services as well as general preventive health  recommendations were provided to patient.     Erie Noe, LPN   [id number]/94/[id number]

## 2021-11-23 NOTE — Patient Instructions (Signed)
Health Maintenance After Age 67 After age 67, you are at a higher risk for certain long-term diseases and infections as well as injuries from falls. Falls are a major cause of broken bones and head injuries in people who are older than age 67. Getting regular preventive care can help to keep you healthy and well. Preventive care includes getting regular testing and making lifestyle changes as recommended by your health care provider. Talk with your health care provider about: Which screenings and tests you should have. A screening is a test that checks for a disease when you have no symptoms. A diet and exercise plan that is right for you. What should I know about screenings and tests to prevent falls? Screening and testing are the best ways to find a health problem early. Early diagnosis and treatment give you the best chance of managing medical conditions that are common after age 67. Certain conditions and lifestyle choices may make you more likely to have a fall. Your health care provider may recommend: Regular vision checks. Poor vision and conditions such as cataracts can make you more likely to have a fall. If you wear glasses, make sure to get your prescription updated if your vision changes. Medicine review. Work with your health care provider to regularly review all of the medicines you are taking, including over-the-counter medicines. Ask your health care provider about any side effects that may make you more likely to have a fall. Tell your health care provider if any medicines that you take make you feel dizzy or sleepy. Strength and balance checks. Your health care provider may recommend certain tests to check your strength and balance while standing, walking, or changing positions. Foot health exam. Foot pain and numbness, as well as not wearing proper footwear, can make you more likely to have a fall. Screenings, including: Osteoporosis screening. Osteoporosis is a condition that causes  the bones to get weaker and break more easily. Blood pressure screening. Blood pressure changes and medicines to control blood pressure can make you feel dizzy. Depression screening. You may be more likely to have a fall if you have a fear of falling, feel depressed, or feel unable to do activities that you used to do. Alcohol use screening. Using too much alcohol can affect your balance and may make you more likely to have a fall. Follow these instructions at home: Lifestyle Do not drink alcohol if: Your health care provider tells you not to drink. If you drink alcohol: Limit how much you have to: 0-1 drink a day for women. 0-2 drinks a day for men. Know how much alcohol is in your drink. In the [place] one drink equals one 12 oz bottle of beer (355 mL), one 5 oz glass of wine (148 mL), or one 1 oz glass of hard liquor (44 mL). Do not use any products that contain nicotine or tobacco. These products include cigarettes, chewing tobacco, and vaping devices, such as e-cigarettes. If you need help quitting, ask your health care provider. Activity  Follow a regular exercise program to stay fit. This will help you maintain your balance. Ask your health care provider what types of exercise are appropriate for you. If you need a cane or walker, use it as recommended by your health care provider. Wear supportive shoes that have nonskid soles. Safety  Remove any tripping hazards, such as rugs, cords, and clutter. Install safety equipment such as grab bars in bathrooms and safety rails on stairs. Keep rooms and walkways   well-lit. General instructions Talk with your health care provider about your risks for falling. Tell your health care provider if: You fall. Be sure to tell your health care provider about all falls, even ones that seem minor. You feel dizzy, tiredness (fatigue), or off-balance. Take over-the-counter and prescription medicines only as told by your health care provider. These include  supplements. Eat a healthy diet and maintain a healthy weight. A healthy diet includes low-fat dairy products, low-fat (lean) meats, and fiber from whole grains, beans, and lots of fruits and vegetables. Stay current with your vaccines. Schedule regular health, dental, and eye exams. Summary Having a healthy lifestyle and getting preventive care can help to protect your health and wellness after age 67. Screening and testing are the best way to find a health problem early and help you avoid having a fall. Early diagnosis and treatment give you the best chance for managing medical conditions that are more common for people who are older than age 67. Falls are a major cause of broken bones and head injuries in people who are older than age 67. Take precautions to prevent a fall at home. Work with your health care provider to learn what changes you can make to improve your health and wellness and to prevent falls. This information is not intended to replace advice given to you by your health care provider. Make sure you discuss any questions you have with your health care provider. Document Revised: 07/07/2020 Document Reviewed: 07/07/2020 Elsevier Patient Education  2023 Elsevier Inc.  

## 2021-11-26 ENCOUNTER — Encounter: Payer: Self-pay | Admitting: Cardiology

## 2021-11-26 ENCOUNTER — Telehealth: Payer: Self-pay

## 2021-11-26 ENCOUNTER — Ambulatory Visit: Payer: Medicare PPO | Attending: Cardiology | Admitting: Cardiology

## 2021-11-26 VITALS — BP 130/74 | HR 58 | Ht 70.0 in | Wt 211.6 lb

## 2021-11-26 DIAGNOSIS — I2510 Atherosclerotic heart disease of native coronary artery without angina pectoris: Secondary | ICD-10-CM | POA: Diagnosis not present

## 2021-11-26 DIAGNOSIS — I5030 Unspecified diastolic (congestive) heart failure: Secondary | ICD-10-CM | POA: Diagnosis not present

## 2021-11-26 DIAGNOSIS — E782 Mixed hyperlipidemia: Secondary | ICD-10-CM | POA: Diagnosis not present

## 2021-11-26 DIAGNOSIS — I110 Hypertensive heart disease with heart failure: Secondary | ICD-10-CM

## 2021-11-26 NOTE — Telephone Encounter (Signed)
Patient notified of results.

## 2021-11-26 NOTE — Patient Instructions (Addendum)
Medication Instructions:  Your physician recommends that you continue on your current medications as directed. Please refer to the Current Medication list given to you today.  *If you need a refill on your cardiac medications before your next appointment, please call your pharmacy*   Lab Work: None Ordered If you have labs (blood work) drawn today and your tests are completely normal, you will receive your results only by: MyChart Message (if you have MyChart) OR A paper copy in the mail If you have any lab test that is abnormal or we need to change your treatment, we will call you to review the results.   Testing/Procedures: Your physician has requested that you have a carotid duplex. This test is an ultrasound of the carotid arteries in your neck. It looks at blood flow through these arteries that supply the brain with blood. Allow one hour for this exam. There are no restrictions or special instructions.    Follow-Up: At [facility], you and your health needs are our priority.  As part of our continuing mission to provide you with exceptional heart care, we have created designated Provider Care Teams.  These Care Teams include your primary Cardiologist (physician) and Advanced Practice Providers (APPs -  Physician Assistants and Nurse Practitioners) who all work together to provide you with the care you need, when you need it.  We recommend signing up for the patient portal called "MyChart".  Sign up information is provided on this After Visit Summary.  MyChart is used to connect with patients for Virtual Visits (Telemedicine).  Patients are able to view lab/test results, encounter notes, upcoming appointments, etc.  Non-urgent messages can be sent to your provider as well.   To learn more about what you can do with MyChart, go to [email or web address].    Your next appointment:   12 month(s)  The format for your next appointment:   In Person  Provider:   Robert Krasowski, MD     Other Instructions NA  

## 2021-11-26 NOTE — Progress Notes (Signed)
Cardiology Office Note:    Date:  11/26/2021   ID:  Mark Mejia, DOB [birth year], MRN [id number]  PCP:  Rochel Brome, MD  Cardiologist:  Jenne Campus, MD    Referring MD: Rochel Brome, MD   Chief Complaint  Patient presents with   Results    CT    History of Present Illness:    Mark Mejia is a 66 y.o. 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  He presented to me last time with atypical symptoms he was playing basketball became very hot started having some chest pain.  We eventually ended up doing coronary CT angio and coronary CT angio showed only very minimal disease 0 to 25%.  He did have cardiac catheterization few years ago which show only nonobstructive disease so I think we can conclude that his symptomatology was likely not related to his heart problems.  Since that timeHe seems to be doing well.  He thinks he simply got overheated that day which could be the case.  He denies have any TIA/CVA-like symptoms, no palpitations.  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   Hypertrophic scar of skin 09/29/2016   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   Mixed hyperlipidemia    Peripheral vascular disease, asymptomatic ([place]) 05/23/2017   80-99% stenosis in the left  internal coronary artery   Precordial chest pain 12/26/2015   Shortness of breath 12/26/2015   Testicular hypofunction    Vitamin D deficiency     Past Surgical History:  Procedure Laterality Date   GASTROSTOMY W/ FEEDING TUBE     LEFT HEART CATH AND CORONARY ANGIOGRAPHY N/A 05/25/2017   Procedure: LEFT HEART CATH AND CORONARY ANGIOGRAPHY;  Surgeon: Martinique, Peter M, MD;  Location: [facility];  Service: Cardiovascular;  Laterality: N/A;   PORTACATH PLACEMENT     THROAT SURGERY      Current Medications: Current Meds  Medication Sig   amLODipine (NORVASC) 5 MG tablet TAKE 1 TABLET EVERY DAY   aspirin EC 81 MG tablet Take 81 mg by mouth daily.    dorzolamide-timolol (COSOPT) 22.3-6.8 MG/ML ophthalmic solution Place 1 drop into the right eye 2 (two) times daily.   eszopiclone (LUNESTA) 2 MG TABS tablet Take 1 tablet (2 mg total) by mouth at bedtime as needed for sleep. Take immediately before bedtime   furosemide (LASIX) 20 MG tablet TAKE 1 TABLET EVERY DAY   Glycerin-Hypromellose-PEG 400 ([facility] DRY EYE RELIEF OP) Place 2 drops into both eyes daily as needed (for dry eyes).   HYDROcodone-acetaminophen (NORCO/VICODIN) 5-325 MG tablet Take 1 tablet by mouth every 6 (six) hours as needed for moderate pain.   latanoprost (XALATAN) 0.005 % ophthalmic solution Place 1 drop into the right eye at bedtime.   levothyroxine (SYNTHROID) 88 MCG  tablet Take 1 tablet (88 mcg total) by mouth daily before breakfast.   meloxicam (MOBIC) 7.5 MG tablet TAKE 1 TABLET TWICE DAILY   metoprolol tartrate (LOPRESSOR) 25 MG tablet Take 1 tablet (25 mg total) by mouth 2 (two) times daily.   nitroGLYCERIN (NITROSTAT) 0.4 MG SL tablet Place 1 tablet (0.4 mg total) under the tongue every 5 (five) minutes as needed for chest pain.   omeprazole (PRILOSEC) 40 MG capsule TAKE 1 CAPSULE EVERY DAY   rosuvastatin (CRESTOR) 20 MG tablet Take 1 tablet (20 mg total) by mouth daily.   sildenafil (VIAGRA) 100 MG tablet Take  100 mg by mouth daily as needed for erectile dysfunction.   tamsulosin (FLOMAX) 0.4 MG CAPS capsule TAKE 2 CAPSULES EVERY DAY   Testosterone Cypionate 200 MG/ML SOLN Inject 200 mg as directed every 14 (fourteen) days.   tiZANidine (ZANAFLEX) 4 MG tablet TAKE 1 TABLET EVERY 6 HOURS AS NEEDED FOR MUSCLE SPASM(S)   Vitamin D, Ergocalciferol, (DRISDOL) 1.25 MG (50000 UNIT) CAPS capsule TAKE 1 CAPSULE EVERY SUNDAY. (Patient taking differently: Take 50,000 Units by mouth every 7 (seven) days.)   Current Facility-Administered Medications for the 11/26/21 encounter (Office Visit) with Park Liter, MD  Medication   testosterone cypionate (DEPOTESTOSTERONE CYPIONATE) injection 200 mg     Allergies:   Patient has no known allergies.   Social History   Socioeconomic History   Marital status: Married    Spouse name: [name]   Number of children: 1   Years of education: college   Highest education level: Not on file  Occupational History   Occupation: Retired  Tobacco Use   Smoking status: Never   Smokeless tobacco: Never  Vaping Use   Vaping Use: Never used  Substance and Sexual Activity   Alcohol use: No   Drug use: No   Sexual activity: Not on file  Other Topics Concern   Not on file  Social History Narrative   Currently retired/disabled   Social Determinants of Health   Financial Resource Strain: Low Risk  (10/01/2021)   Overall Financial Resource Strain (CARDIA)    Difficulty of Paying Living Expenses: Not hard at all  Food Insecurity: No Food Insecurity (02/17/2021)   Hunger Vital Sign    Worried About Running Out of Food in the Last Year: Never true    [place] in the Last Year: Never true  Transportation Needs: No Transportation Needs (10/01/2021)   PRAPARE - Hydrologist (Medical): No    Lack of Transportation (Non-Medical): No  Physical Activity: Not on file  Stress: Not on file  Social Connections: Not on file     Family  History: The patient's family history includes Arthritis in his mother; Cancer - Other in his maternal grandmother; Hypertension in his mother. ROS:   Please see the history of present illness.    All 14 point review of systems negative except as described per history of present illness  EKGs/Labs/Other Studies Reviewed:      Recent Labs: 08/04/2021: TSH 6.480 11/09/2021: ALT 23; BUN 11; Creatinine, Ser 1.08; Hemoglobin 13.9; Platelets 141; Potassium 4.1; Sodium 140  Recent Lipid Panel    Component Value Date/Time   CHOL 114 11/09/2021 1608   TRIG 92 11/09/2021 1608   HDL 32 (L) 11/09/2021 1608   CHOLHDL 3.6 11/09/2021 1608   LDLCALC 64 11/09/2021 1608    Physical Exam:    VS:  BP 130/74 (BP Location: Left Arm,  Patient Position: Sitting)   Pulse (!) 58   Ht '5\' 10"'$  (1.778 m)   Wt 211 lb 9.6 oz (96 kg)   SpO2 91%   BMI 30.36 kg/m     Wt Readings from Last 3 Encounters:  11/26/21 211 lb 9.6 oz (96 kg)  11/09/21 206 lb (93.4 kg)  10/15/21 206 lb 0.6 oz (93.5 kg)     GEN:  Well nourished, well developed in no acute distress HEENT: Normal NECK: No JVD; No carotid bruits LYMPHATICS: No lymphadenopathy CARDIAC: RRR, no murmurs, no rubs, no gallops RESPIRATORY:  Clear to auscultation without rales, wheezing or rhonchi  ABDOMEN: Soft, non-tender, non-distended MUSCULOSKELETAL:  No edema; No deformity  SKIN: Warm and dry LOWER EXTREMITIES: no swelling NEUROLOGIC:  Alert and oriented x 3 PSYCHIATRIC:  Normal affect   ASSESSMENT:    1. Coronary artery disease involving native coronary artery of native heart without angina pectoris   2. 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 Mixed hyperlipidemia    PLAN:    In order of problems listed above:    Coronary disease only minimal on recent coronary CT angio, will continue risk factors modifications as well as antiplatelet therapy. Peripheral vascular disease in form of carotic arterial disease.  We  will schedule him to have carotic ultrasound. Dyslipidemia he is taking Crestor 20 which I will continue I did review K PN which show me data from November 09, 2021 LDL 64 HDL 32 good numbers continue present management Essential hypertension: Blood pressure well controlled.   Medication Adjustments/Labs and Tests Ordered: Current medicines are reviewed at length with the patient today.  Concerns regarding medicines are outlined above.  No orders of the defined types were placed in this encounter.  Medication changes: No orders of the defined types were placed in this encounter.   Signed, Park Liter, MD, [facility] 11/26/2021 2:08 PM    [place]

## 2021-11-26 NOTE — Telephone Encounter (Signed)
-----   Message from Park Liter, MD sent at 11/24/2021  1:37 PM EDT ----- Coronary CT angio showed only minimal, nonobstructive disease.  This is good news

## 2021-12-02 ENCOUNTER — Ambulatory Visit (INDEPENDENT_AMBULATORY_CARE_PROVIDER_SITE_OTHER): Payer: Medicare PPO

## 2021-12-02 DIAGNOSIS — E291 Testicular hypofunction: Secondary | ICD-10-CM | POA: Diagnosis not present

## 2021-12-02 NOTE — Progress Notes (Signed)
   Testosterone injection given per order, patient tolerated well.   Erie Noe, LPN 0:96 PM

## 2021-12-06 ENCOUNTER — Other Ambulatory Visit: Payer: Self-pay | Admitting: Cardiology

## 2021-12-09 ENCOUNTER — Ambulatory Visit: Payer: Medicare PPO

## 2021-12-14 ENCOUNTER — Other Ambulatory Visit: Payer: Self-pay | Admitting: Family Medicine

## 2021-12-14 ENCOUNTER — Telehealth: Payer: Self-pay

## 2021-12-14 NOTE — Chronic Care Management (AMB) (Signed)
Chronic Care Management Pharmacy Assistant   Name: Mark Mejia  MRN: [id number] DOB: 10-07-1955  Reason for Encounter: Disease State/ Hypertension  Recent office visits:  12-02-2021 Erie Noe, LPN. Testosterone injection given.  11-18-2021 Erie Noe, LPN. Testosterone injection given.  11-17-2021 Erie Noe, LPN. Annual wellness visit.  Recent consult visits:  11-26-2021 Park Liter, MD (Cardiology). VAS US CAROTID ordered. EKG completed  Hospital visits:  Medication Reconciliation was completed by comparing discharge summary, patient's EMR and Pharmacy list, and upon discussion with patient.   Admitted to the hospital on 09-03-2021 due to Community acquired pneumonia. Discharge date was 09-03-2021. Discharged from [place]?Medications Started at [facility] Discharge:?? Amoxicillin 1,000 mg twice daily Doxycycline 100 mg twice daily   Medication Changes at Hospital Discharge: None   Medications Discontinued at Hospital Discharge: None   Medications that remain the same after Hospital Discharge:??  -All other medications will remain the same.     Medications: Outpatient Encounter Medications as of 12/14/2021  Medication Sig   amLODipine (NORVASC) 5 MG tablet TAKE 1 TABLET EVERY DAY   aspirin EC 81 MG tablet Take 81 mg by mouth daily.    dorzolamide-timolol (COSOPT) 22.3-6.8 MG/ML ophthalmic solution Place 1 drop into the right eye 2 (two) times daily.   eszopiclone (LUNESTA) 2 MG TABS tablet Take 1 tablet (2 mg total) by mouth at bedtime as needed for sleep. Take immediately before bedtime   furosemide (LASIX) 20 MG tablet TAKE 1 TABLET EVERY DAY   Glycerin-Hypromellose-PEG 400 ([facility] DRY EYE RELIEF OP) Place 2 drops into both eyes daily as needed (for dry eyes).   HYDROcodone-acetaminophen (NORCO/VICODIN) 5-325 MG tablet Take 1 tablet by mouth every 6 (six) hours as needed for moderate pain.   latanoprost (XALATAN)  0.005 % ophthalmic solution Place 1 drop into the right eye at bedtime.   levothyroxine (SYNTHROID) 88 MCG tablet Take 1 tablet (88 mcg total) by mouth daily before breakfast.   meloxicam (MOBIC) 7.5 MG tablet TAKE 1 TABLET TWICE DAILY   metoprolol tartrate (LOPRESSOR) 25 MG tablet Take 1 tablet (25 mg total) by mouth 2 (two) times daily.   nitroGLYCERIN (NITROSTAT) 0.4 MG SL tablet Place 1 tablet (0.4 mg total) under the tongue every 5 (five) minutes as needed for chest pain.   omeprazole (PRILOSEC) 40 MG capsule TAKE 1 CAPSULE EVERY DAY   rosuvastatin (CRESTOR) 20 MG tablet TAKE 1 TABLET EVERY DAY   sildenafil (VIAGRA) 100 MG tablet Take 100 mg by mouth daily as needed for erectile dysfunction.   tamsulosin (FLOMAX) 0.4 MG CAPS capsule TAKE 2 CAPSULES EVERY DAY   Testosterone Cypionate 200 MG/ML SOLN Inject 200 mg as directed every 14 (fourteen) days.   tiZANidine (ZANAFLEX) 4 MG tablet TAKE 1 TABLET EVERY 6 HOURS AS NEEDED FOR MUSCLE SPASM(S)   Vitamin D, Ergocalciferol, (DRISDOL) 1.25 MG (50000 UNIT) CAPS capsule TAKE 1 CAPSULE EVERY SUNDAY. (Patient taking differently: Take 50,000 Units by mouth every 7 (seven) days.)   Facility-Administered Encounter Medications as of 12/14/2021  Medication   testosterone cypionate (DEPOTESTOSTERONE CYPIONATE) injection 200 mg   Recent Office Vitals: BP Readings from Last 3 Encounters:  11/26/21 130/74  11/23/21 122/69  11/09/21 114/64   Pulse Readings from Last 3 Encounters:  11/26/21 (!) 58  11/23/21 62  11/09/21 66    Wt Readings from Last 3 Encounters:  11/26/21 211 lb 9.6 oz (96 kg)  11/09/21 206 lb (93.4  kg)  10/15/21 206 lb 0.6 oz (93.5 kg)     Kidney Function Lab Results  Component Value Date/Time   CREATININE 1.08 11/09/2021 04:08 PM   CREATININE 1.1 10/14/2021 12:00 AM   CREATININE 1.30 (H) 09/03/2021 01:19 PM   GFRNONAA >60 09/03/2021 01:19 PM   GFRAA 74 12/14/2019 10:50 AM       Latest Ref Rng & Units 11/09/2021    4:08  PM 10/14/2021   12:00 AM 09/03/2021    1:19 PM  BMP  Glucose 70 - 99 mg/dL 74   91   BUN 8 - 27 mg/dL '11  13     12   '$ Creatinine 0.76 - 1.27 mg/dL 1.08  1.1     1.30   BUN/Creat Ratio 10 - 24 10     Sodium 134 - 144 mmol/L 140  138     136   Potassium 3.5 - 5.2 mmol/L 4.1  4.0     4.0   Chloride 96 - 106 mmol/L 104  101     103   CO2 20 - 29 mmol/L '19  29     25   '$ Calcium 8.6 - 10.2 mg/dL 9.5  12.0     9.5      This result is from an external source.     Current antihypertensive regimen:  Amlodipine 5 mg  daily Metoprolol Tart 25 mg BID   Patient verbally confirms he is taking the above medications as directed. Yes  How often are you checking your Blood Pressure? daily  he checks his blood pressure in the morning before taking his medication.  Current home BP readings: 10-16 133/76 68, 10-15 140/77 66, 10-14 137/76 66 Wrist or arm cuff: Arm cuff Caffeine intake: Patient stated tea some days Salt intake: Limited OTC medications including pseudoephedrine or NSAIDs? None  Any readings above 180/120? No  What recent interventions/DTPs have been made by any provider to improve Blood Pressure control since last CPP Visit:  Educated on BP goals and benefits of medications for prevention of heart attack, stroke and kidney damage; -Counseled to monitor BP at home weekly, document, and provide log at future appointments -December 2022: Updated care plan -Recommended to continue current medication.   Any recent hospitalizations or ED visits since last visit with CPP? No  What diet changes have been made to improve Blood Pressure Control?  Patient stated he limits his sodium intake and drinks plenty of water daily.  What exercise is being done to improve your Blood Pressure Control?  Patient stated he walks some days.  Adherence Review: Is the patient currently on ACE/ARB medication? No Does the patient have >5 day gap between last estimated fill dates? CPP to review  Care  Gaps: Last annual wellness visit? 11-17-2021  Star Rating Drugs: Rosuvastatin 20 mg- Last filled 12-07-2021 90 DS  [place] Clinical Pharmacist Assistant [phone]

## 2021-12-16 ENCOUNTER — Ambulatory Visit (INDEPENDENT_AMBULATORY_CARE_PROVIDER_SITE_OTHER): Payer: Medicare PPO

## 2021-12-16 DIAGNOSIS — E291 Testicular hypofunction: Secondary | ICD-10-CM

## 2021-12-16 NOTE — Progress Notes (Signed)
   Testosterone injection given per order, patient tolerated well.   Erie Noe, LPN 3:69 PM

## 2021-12-21 ENCOUNTER — Ambulatory Visit: Payer: Medicare PPO

## 2021-12-30 ENCOUNTER — Ambulatory Visit (INDEPENDENT_AMBULATORY_CARE_PROVIDER_SITE_OTHER): Payer: Medicare PPO

## 2021-12-30 DIAGNOSIS — E291 Testicular hypofunction: Secondary | ICD-10-CM

## 2021-12-30 NOTE — Progress Notes (Signed)
   Testosterone injection given per order, patient tolerated well.   Erie Noe, LPN 6:28 PM

## 2022-01-05 ENCOUNTER — Ambulatory Visit: Payer: Medicare PPO | Attending: Cardiology

## 2022-01-05 DIAGNOSIS — I2510 Atherosclerotic heart disease of native coronary artery without angina pectoris: Secondary | ICD-10-CM

## 2022-01-05 DIAGNOSIS — I6523 Occlusion and stenosis of bilateral carotid arteries: Secondary | ICD-10-CM

## 2022-01-14 ENCOUNTER — Telehealth: Payer: Self-pay

## 2022-01-14 NOTE — Progress Notes (Signed)
Chronic Care Management Pharmacy Assistant   Name: Mark Mejia  MRN: [id number] DOB: [birth year]   Reason for Encounter: Disease State/ Hypertension  Recent office visits:  12-30-2021 Erie Noe, LPN. Testosterone injection given   12-16-2021 Erie Noe, LPN. Testosterone injection given   Recent consult visits:  None  Hospital visits:  Medication Reconciliation was completed by comparing discharge summary, patient's EMR and Pharmacy list, and upon discussion with patient.   Admitted to the hospital on 09-03-2021 due to Community acquired pneumonia. Discharge date was 09-03-2021. Discharged from [place]?Medications Started at [facility] Discharge:?? Amoxicillin 1,000 mg twice daily Doxycycline 100 mg twice daily   Medication Changes at Hospital Discharge: None   Medications Discontinued at Hospital Discharge: None   Medications that remain the same after Hospital Discharge:??  -All other medications will remain the same.    Medications: Outpatient Encounter Medications as of 01/14/2022  Medication Sig   amLODipine (NORVASC) 5 MG tablet TAKE 1 TABLET EVERY DAY   aspirin EC 81 MG tablet Take 81 mg by mouth daily.    dorzolamide-timolol (COSOPT) 22.3-6.8 MG/ML ophthalmic solution Place 1 drop into the right eye 2 (two) times daily.   eszopiclone (LUNESTA) 2 MG TABS tablet Take 1 tablet (2 mg total) by mouth at bedtime as needed for sleep. Take immediately before bedtime   furosemide (LASIX) 20 MG tablet TAKE 1 TABLET EVERY DAY   Glycerin-Hypromellose-PEG 400 ([facility] DRY EYE RELIEF OP) Place 2 drops into both eyes daily as needed (for dry eyes).   HYDROcodone-acetaminophen (NORCO/VICODIN) 5-325 MG tablet Take 1 tablet by mouth every 6 (six) hours as needed for moderate pain.   latanoprost (XALATAN) 0.005 % ophthalmic solution Place 1 drop into the right eye at bedtime.   levothyroxine (SYNTHROID) 88 MCG tablet Take 1 tablet (88 mcg total)  by mouth daily before breakfast.   meloxicam (MOBIC) 7.5 MG tablet TAKE 1 TABLET TWICE DAILY   metoprolol tartrate (LOPRESSOR) 25 MG tablet Take 1 tablet (25 mg total) by mouth 2 (two) times daily.   nitroGLYCERIN (NITROSTAT) 0.4 MG SL tablet Place 1 tablet (0.4 mg total) under the tongue every 5 (five) minutes as needed for chest pain.   omeprazole (PRILOSEC) 40 MG capsule TAKE 1 CAPSULE EVERY DAY   rosuvastatin (CRESTOR) 20 MG tablet TAKE 1 TABLET EVERY DAY   sildenafil (VIAGRA) 100 MG tablet Take 100 mg by mouth daily as needed for erectile dysfunction.   tamsulosin (FLOMAX) 0.4 MG CAPS capsule TAKE 2 CAPSULES EVERY DAY   Testosterone Cypionate 200 MG/ML SOLN Inject 200 mg as directed every 14 (fourteen) days.   tiZANidine (ZANAFLEX) 4 MG tablet TAKE 1 TABLET EVERY 6 HOURS AS NEEDED FOR MUSCLE SPASM(S)   Vitamin D, Ergocalciferol, (DRISDOL) 1.25 MG (50000 UNIT) CAPS capsule TAKE 1 CAPSULE EVERY SUNDAY. (Patient taking differently: Take 50,000 Units by mouth every 7 (seven) days.)   Facility-Administered Encounter Medications as of 01/14/2022  Medication   testosterone cypionate (DEPOTESTOSTERONE CYPIONATE) injection 200 mg   Recent Office Vitals: BP Readings from Last 3 Encounters:  11/26/21 130/74  11/23/21 122/69  11/09/21 114/64   Pulse Readings from Last 3 Encounters:  11/26/21 (!) 58  11/23/21 62  11/09/21 66    Wt Readings from Last 3 Encounters:  11/26/21 211 lb 9.6 oz (96 kg)  11/09/21 206 lb (93.4 kg)  10/15/21 206 lb 0.6 oz (93.5 kg)     Kidney Function Lab Results  Component Value Date/Time   CREATININE 1.08 11/09/2021 04:08 PM   CREATININE 1.1 10/14/2021 12:00 AM   CREATININE 1.30 (H) 09/03/2021 01:19 PM   GFRNONAA >60 09/03/2021 01:19 PM   GFRAA 74 12/14/2019 10:50 AM       Latest Ref Rng & Units 11/09/2021    4:08 PM 10/14/2021   12:00 AM 09/03/2021    1:19 PM  BMP  Glucose 70 - 99 mg/dL 74   91   BUN 8 - 27 mg/dL '11  13     12   '$ Creatinine 0.76 - 1.27  mg/dL 1.08  1.1     1.30   BUN/Creat Ratio 10 - 24 10     Sodium 134 - 144 mmol/L 140  138     136   Potassium 3.5 - 5.2 mmol/L 4.1  4.0     4.0   Chloride 96 - 106 mmol/L 104  101     103   CO2 20 - 29 mmol/L '19  29     25   '$ Calcium 8.6 - 10.2 mg/dL 9.5  12.0     9.5      This result is from an external source.     Current antihypertensive regimen:  Amlodipine 5 mg  daily Metoprolol Tart 25 mg BID   Patient verbally confirms he is taking the above medications as directed. Yes  How often are you checking your Blood Pressure? daily  he checks his blood pressure in the morning before taking his medication.  Current home BP readings: 11-16 136/79 66, 11-15 126/78 68, 11-14 133/78 69, 11-13 136/79 70 Wrist or arm cuff:  Arm cuff  Caffeine intake: Patient stated tea some days  Salt intake: Limited  OTC medications including pseudoephedrine or NSAIDs? None  Any readings above 180/120? No  What recent interventions/DTPs have been made by any provider to improve Blood Pressure control since last CPP Visit:  Educated on BP goals and benefits of medications for prevention of heart attack, stroke and kidney damage; -Counseled to monitor BP at home weekly, document, and provide log at future appointments -December 2022: Updated care plan -Recommended to continue current medication.   Any recent hospitalizations or ED visits since last visit with CPP? No  What diet changes have been made to improve Blood Pressure Control?  Patient stated he limits his sodium intake and drinks plenty of water daily.   What exercise is being done to improve your Blood Pressure Control?  Patient stated he walks some days.   Adherence Review: Is the patient currently on ACE/ARB medication? No Does the patient have >5 day gap between last estimated fill dates? CPP to review  Care Gaps: Last annual wellness visit? 11-17-2021  Shingirx overdue PNA Vac overdue Covid booster overdue Tdap overdue  Star  Rating Drugs: Rosuvastatin 20 mg- Last filled 12-07-2021 90 DS. Previous filled 09-24-2021 90 DS.   [place] Pharmacist Assistant [phone]

## 2022-01-21 ENCOUNTER — Other Ambulatory Visit: Payer: Self-pay | Admitting: Cardiology

## 2022-01-29 ENCOUNTER — Other Ambulatory Visit: Payer: Self-pay | Admitting: Family Medicine

## 2022-02-01 ENCOUNTER — Other Ambulatory Visit: Payer: Self-pay | Admitting: Family Medicine

## 2022-02-01 DIAGNOSIS — F5101 Primary insomnia: Secondary | ICD-10-CM

## 2022-02-02 MED ORDER — LEVOTHYROXINE SODIUM 88 MCG PO TABS: 88.0000 ug | ORAL_TABLET | Freq: Every day | ORAL | 0 refills | Status: DC

## 2022-02-11 ENCOUNTER — Other Ambulatory Visit (HOSPITAL_COMMUNITY): Payer: Self-pay

## 2022-02-16 ENCOUNTER — Ambulatory Visit (INDEPENDENT_AMBULATORY_CARE_PROVIDER_SITE_OTHER): Payer: Medicare PPO | Admitting: Family Medicine

## 2022-02-16 VITALS — BP 136/68 | HR 68 | Temp 97.4°F | Resp 16 | Ht 70.0 in | Wt 211.0 lb

## 2022-02-16 DIAGNOSIS — E291 Testicular hypofunction: Secondary | ICD-10-CM | POA: Diagnosis not present

## 2022-02-16 DIAGNOSIS — I110 Hypertensive heart disease with heart failure: Secondary | ICD-10-CM | POA: Diagnosis not present

## 2022-02-16 DIAGNOSIS — Z23 Encounter for immunization: Secondary | ICD-10-CM

## 2022-02-16 DIAGNOSIS — I5030 Unspecified diastolic (congestive) heart failure: Secondary | ICD-10-CM | POA: Diagnosis not present

## 2022-02-16 DIAGNOSIS — I700 Atherosclerosis of aorta: Secondary | ICD-10-CM | POA: Diagnosis not present

## 2022-02-16 DIAGNOSIS — E782 Mixed hyperlipidemia: Secondary | ICD-10-CM

## 2022-02-16 DIAGNOSIS — I2510 Atherosclerotic heart disease of native coronary artery without angina pectoris: Secondary | ICD-10-CM

## 2022-02-16 DIAGNOSIS — K219 Gastro-esophageal reflux disease without esophagitis: Secondary | ICD-10-CM

## 2022-02-16 MED ORDER — FAMOTIDINE 20 MG PO TABS: 20.0000 mg | ORAL_TABLET | Freq: Every day | ORAL | 1 refills | Status: DC

## 2022-02-16 NOTE — Patient Instructions (Signed)
Start on pepcid 20 mg before bed

## 2022-02-16 NOTE — Progress Notes (Signed)
Subjective:  Patient ID: Mark Mejia, male    DOB: 1955/06/04  Age: 66 y.o. MRN: [id number]  Chief Complaint  Patient presents with   Hyperlipidemia   Hypertension   Chronic Kidney Disease    HPI Hypertension: Checking bp daily. 130-147/60-80s. ON aspirin 81 mg daily, Metoprolol 25 mg twice a day, and amlodipine 5 mg daily.   Hypothyroidism: Takes Levothyroxine 88 mcg daily.   Hyperlipidemia: Rosuvastatin 20 mg daily.  GERD: Omeprazole 40 mg daily in the afternoon. Has drainage in throat that occurs at night.   Hypogonadism male: Testosterone cypionate 200 mg every 14 days.   BPH: Tamsulosin 0.4 mg take 2 tablets twice a day.  Current Outpatient Medications on File Prior to Visit  Medication Sig Dispense Refill   amLODipine (NORVASC) 5 MG tablet TAKE 1 TABLET EVERY DAY 90 tablet 0   aspirin EC 81 MG tablet Take 81 mg by mouth daily.      dorzolamide-timolol (COSOPT) 22.3-6.8 MG/ML ophthalmic solution Place 1 drop into the right eye 2 (two) times daily.     eszopiclone (LUNESTA) 2 MG TABS tablet TAKE 1 TABLET IMMEDIATELY BEFORE BEDTIME AS NEEDED FOR SLEEP 30 tablet 5   furosemide (LASIX) 20 MG tablet TAKE 1 TABLET EVERY DAY 90 tablet 1   Glycerin-Hypromellose-PEG 400 ([facility] DRY EYE RELIEF OP) Place 2 drops into both eyes daily as needed (for dry eyes).     HYDROcodone-acetaminophen (NORCO/VICODIN) 5-325 MG tablet Take 1 tablet by mouth every 6 (six) hours as needed for moderate pain. 30 tablet 0   latanoprost (XALATAN) 0.005 % ophthalmic solution Place 1 drop into the right eye at bedtime.     levothyroxine (SYNTHROID) 88 MCG tablet Take 1 tablet (88 mcg total) by mouth daily before breakfast. 90 tablet 0   meloxicam (MOBIC) 7.5 MG tablet TAKE 1 TABLET TWICE DAILY 180 tablet 1   metoprolol tartrate (LOPRESSOR) 25 MG tablet Take 1 tablet (25 mg total) by mouth 2 (two) times daily. 180 tablet 2   nitroGLYCERIN (NITROSTAT) 0.4 MG SL tablet Place 1 tablet (0.4 mg total) under the  tongue every 5 (five) minutes as needed for chest pain. 25 tablet 1   omeprazole (PRILOSEC) 40 MG capsule TAKE 1 CAPSULE EVERY DAY 90 capsule 1   rosuvastatin (CRESTOR) 20 MG tablet TAKE 1 TABLET EVERY DAY 90 tablet 0   sildenafil (VIAGRA) 100 MG tablet Take 100 mg by mouth daily as needed for erectile dysfunction.     tamsulosin (FLOMAX) 0.4 MG CAPS capsule TAKE 2 CAPSULES EVERY DAY 180 capsule 0   Testosterone Cypionate 200 MG/ML SOLN Inject 200 mg as directed every 14 (fourteen) days. 5 mL 2   tiZANidine (ZANAFLEX) 4 MG tablet TAKE 1 TABLET EVERY 6 HOURS AS NEEDED FOR MUSCLE SPASM(S) 90 tablet 1   Vitamin D, Ergocalciferol, (DRISDOL) 1.25 MG (50000 UNIT) CAPS capsule TAKE 1 CAPSULE EVERY SUNDAY. (Patient taking differently: Take 50,000 Units by mouth every 7 (seven) days.) 12 capsule 1   Current Facility-Administered Medications on File Prior to Visit  Medication Dose Route Frequency Provider Last Rate Last Admin   testosterone cypionate (DEPOTESTOSTERONE CYPIONATE) injection 200 mg  200 mg Intramuscular Q14 Days Drinda Belgard, MD   200 mg at 12/30/21 1605   Past Medical History:  Diagnosis Date   Angina pectoris ([place]) 05/25/2017   Atrophy of thyroid    Benign prostatic hyperplasia with nocturia 06/14/2019   CAD (coronary artery disease)    Cancer of cervical esophagus ([place])  12/06/2012   Chest pain of uncertain etiology 1/96/2229   Encounter for adjustment and management of vascular access device 03/10/2016   Encounter for annual wellness exam in Medicare patient 03/10/2016   GERD (gastroesophageal reflux disease)    Hepatitis C    History of esophageal cancer    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[place]) 06/14/2019   Hypertrophic scar of skin 09/29/2016   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   Mixed hyperlipidemia    Peripheral vascular disease, asymptomatic ([place]) 05/23/2017   80-99% stenosis in the left internal  coronary artery   Precordial chest pain 12/26/2015   Shortness of breath 12/26/2015   Testicular hypofunction    Vitamin D deficiency    Past Surgical History:  Procedure Laterality Date   GASTROSTOMY W/ FEEDING TUBE     LEFT HEART CATH AND CORONARY ANGIOGRAPHY N/A 05/25/2017   Procedure: LEFT HEART CATH AND CORONARY ANGIOGRAPHY;  Surgeon: Martinique, Peter M, MD;  Location: [place] [facility];  Service: Cardiovascular;  Laterality: N/A;   PORTACATH PLACEMENT     THROAT SURGERY      Family History  Problem Relation Age of Onset   Arthritis Mother    Hypertension Mother    Cancer - Other Maternal Grandmother    Social History   Socioeconomic History   Marital status: Married    Spouse name: [name]   Number of children: 1   Years of education: college   Highest education level: Not on file  Occupational History   Occupation: Retired  Tobacco Use   Smoking status: Never   Smokeless tobacco: Never  Vaping Use   Vaping Use: Never used  Substance and Sexual Activity   Alcohol use: No   Drug use: No   Sexual activity: Not on file  Other Topics Concern   Not on file  Social History Narrative   Currently retired/disabled   Social Determinants of Health   Financial Resource Strain: Low Risk  (10/01/2021)   Overall Financial Resource Strain (CARDIA)    Difficulty of Paying Living Expenses: Not hard at all  Food Insecurity: No Food Insecurity (02/17/2021)   Hunger Vital Sign    Worried About Running Out of Food in the Last Year: Never true    [place] in the Last Year: Never true  Transportation Needs: No Transportation Needs (10/01/2021)   PRAPARE - Hydrologist (Medical): No    Lack of Transportation (Non-Medical): No  Physical Activity: Not on file  Stress: Not on file  Social Connections: Not on file    Review of Systems  Constitutional:  Negative for chills and fever.  HENT:  Negative for congestion, rhinorrhea and sore throat.    Respiratory:  Negative for cough and shortness of breath.   Cardiovascular:  Negative for chest pain and palpitations.  Gastrointestinal:  Negative for abdominal pain, constipation, diarrhea, nausea and vomiting.  Genitourinary:  Positive for frequency. Negative for dysuria and urgency.  Musculoskeletal:  Positive for back pain. Negative for arthralgias and myalgias (leg pain).  Neurological:  Negative for dizziness and headaches.  Psychiatric/Behavioral:  Negative for dysphoric mood. The patient is not nervous/anxious.      Objective:  BP 136/68   Pulse 68   Temp (!) 97.4 F (36.3 C)   Resp 16   Ht '5\' 10"'$  (1.778 m)   Wt 211 lb (95.7 kg)   BMI 30.28 kg/m  02/16/2022    3:07 PM 11/26/2021    1:51 PM 11/23/2021    4:06 PM  BP/Weight  Systolic BP 557 322 025  Diastolic BP 68 74 69  Wt. (Lbs) 211 211.6   BMI 30.28 kg/m2 30.36 kg/m2     Physical Exam Vitals reviewed.  Constitutional:      Appearance: Normal appearance.  Cardiovascular:     Rate and Rhythm: Normal rate and regular rhythm.     Heart sounds: Normal heart sounds.  Pulmonary:     Effort: Pulmonary effort is normal.     Breath sounds: Normal breath sounds. No wheezing, rhonchi or rales.  Abdominal:     General: Bowel sounds are normal.     Palpations: Abdomen is soft.     Tenderness: There is no abdominal tenderness.  Neurological:     Mental Status: He is alert and oriented to person, place, and time.  Psychiatric:        Mood and Affect: Mood normal.        Behavior: Behavior normal.     Diabetic Foot Exam - Simple   No data filed      Lab Results  Component Value Date   WBC 4.2 02/16/2022   HGB 14.5 02/16/2022   HCT 44.8 02/16/2022   PLT 142 (L) 02/16/2022   GLUCOSE 87 02/16/2022   CHOL 114 11/09/2021   TRIG 92 11/09/2021   HDL 32 (L) 11/09/2021   LDLCALC 64 11/09/2021   ALT 24 02/16/2022   AST 34 02/16/2022   NA 140 02/16/2022   K 4.2 02/16/2022   CL 103 02/16/2022   CREATININE  1.23 02/16/2022   BUN 13 02/16/2022   CO2 23 02/16/2022   TSH 6.480 (H) 08/04/2021   INR 1.1 05/23/2017      Assessment & Plan:   Problem List Items Addressed This Visit       Cardiovascular and Mediastinum   CAD (coronary artery disease)CAD RAD 1    Continue aspirin, metoprolol, crestor, ntg.      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  No changes to medicines. Continue amlodipine 5 mg daily, lasix 20 mg daily, metoprolol 25 mg one oral twice daily,  Continue to work on eating a healthy diet and exercise.  Labs drawn today.        Relevant Orders   CBC with Differential/Platelet (Completed)   Comprehensive metabolic panel (Completed)   Aortic atherosclerosis ([place])    The current medical regimen is effective;  continue present plan and medications.         Digestive   GERD (gastroesophageal reflux disease)    Not at goal.  Continue omeprazole 40 mg daily. Add famotidine 20 mg before bed.       Relevant Medications   famotidine (PEPCID) 20 MG tablet     Endocrine   Male hypogonadism    Check testosterone levels.  Decrease testosterone shots to 100 mg once every 2 weeks.       Relevant Orders   Testosterone,Free and Total (Completed)     Other   Mixed hyperlipidemia    Well controlled.  No changes to medicines. Continue crestor 20 mg before bed.  Continue to work on eating a healthy diet and exercise.  Labs drawn today.        Other Visit Diagnoses     Need for COVID-19 vaccine       Relevant Orders   [employer]  Fall 2023 Covid-19 Vaccine 85yr and older (Completed)   Need for vaccination       Relevant Orders   Pneumococcal conjugate vaccine 20-valent (Completed)     .  Meds ordered this encounter  Medications   famotidine (PEPCID) 20 MG tablet    Sig: Take 1 tablet (20 mg total) by mouth at bedtime.    Dispense:  90 tablet    Refill:  1    Orders Placed This Encounter  Procedures    [place] 2023 Covid-19 Vaccine 182yrand older   Pneumococcal conjugate vaccine 20-valent   CBC with Differential/Platelet   Comprehensive metabolic panel   Testosterone,Free and Total     Follow-up: Return in about 4 months (around 06/18/2022) for chronic fasting.  An After Visit Summary was printed and given to the patient.  KiRochel BromeMD [facility] ([phone]

## 2022-02-19 LAB — CBC WITH DIFFERENTIAL/PLATELET
Basophils Absolute: 0.1 10*3/uL (ref 0.0–0.2)
Basos: 1 %
EOS (ABSOLUTE): 0.1 10*3/uL (ref 0.0–0.4)
Eos: 3 %
Hematocrit: 44.8 % (ref 37.5–51.0)
Hemoglobin: 14.5 g/dL (ref 13.0–17.7)
Immature Grans (Abs): 0 10*3/uL (ref 0.0–0.1)
Immature Granulocytes: 0 %
Lymphocytes Absolute: 1.9 10*3/uL (ref 0.7–3.1)
Lymphs: 45 %
MCH: 25.4 pg — ABNORMAL LOW (ref 26.6–33.0)
MCHC: 32.4 g/dL (ref 31.5–35.7)
MCV: 79 fL (ref 79–97)
Monocytes Absolute: 0.3 10*3/uL (ref 0.1–0.9)
Monocytes: 7 %
Neutrophils Absolute: 1.9 10*3/uL (ref 1.4–7.0)
Neutrophils: 44 %
Platelets: 142 10*3/uL — ABNORMAL LOW (ref 150–450)
RBC: 5.7 x10E6/uL (ref 4.14–5.80)
RDW: 16.4 % — ABNORMAL HIGH (ref 11.6–15.4)
WBC: 4.2 10*3/uL (ref 3.4–10.8)

## 2022-02-19 LAB — COMPREHENSIVE METABOLIC PANEL
ALT: 24 IU/L (ref 0–44)
AST: 34 IU/L (ref 0–40)
Albumin/Globulin Ratio: 1.4 (ref 1.2–2.2)
Albumin: 4.6 g/dL (ref 3.9–4.9)
Alkaline Phosphatase: 61 IU/L (ref 44–121)
BUN/Creatinine Ratio: 11 (ref 10–24)
BUN: 13 mg/dL (ref 8–27)
Bilirubin Total: 0.5 mg/dL (ref 0.0–1.2)
CO2: 23 mmol/L (ref 20–29)
Calcium: 9.7 mg/dL (ref 8.6–10.2)
Chloride: 103 mmol/L (ref 96–106)
Creatinine, Ser: 1.23 mg/dL (ref 0.76–1.27)
Globulin, Total: 3.3 g/dL (ref 1.5–4.5)
Glucose: 87 mg/dL (ref 70–99)
Potassium: 4.2 mmol/L (ref 3.5–5.2)
Sodium: 140 mmol/L (ref 134–144)
Total Protein: 7.9 g/dL (ref 6.0–8.5)
eGFR: 64 mL/min/{1.73_m2} (ref >59)

## 2022-02-19 LAB — TESTOSTERONE,FREE AND TOTAL
Testosterone, Free: 4.4 pg/mL — ABNORMAL LOW (ref 6.6–18.1)
Testosterone: 290 ng/dL (ref 264–916)

## 2022-02-22 ENCOUNTER — Encounter: Payer: Self-pay | Admitting: Family Medicine

## 2022-02-22 NOTE — Assessment & Plan Note (Addendum)
Not at goal.  Continue omeprazole 40 mg daily. Add famotidine 20 mg before bed.

## 2022-02-22 NOTE — Assessment & Plan Note (Signed)
Well controlled.  No changes to medicines. Continue amlodipine 5 mg daily, lasix 20 mg daily, metoprolol 25 mg one oral twice daily,  Continue to work on eating a healthy diet and exercise.  Labs drawn today.

## 2022-02-22 NOTE — Assessment & Plan Note (Signed)
>>  ASSESSMENT AND PLAN FOR AORTIC ATHEROSCLEROSIS (HCC) WRITTEN ON 02/22/2022 10:28 PM BY COX, KIRSTEN, MD  The current medical regimen is effective;  continue present plan and medications.

## 2022-02-22 NOTE — Assessment & Plan Note (Signed)
Well controlled.  No changes to medicines. Continue crestor 20 mg before bed.  Continue to work on eating a healthy diet and exercise.  Labs drawn today.

## 2022-02-22 NOTE — Assessment & Plan Note (Signed)
The current medical regimen is effective;  continue present plan and medications.  

## 2022-02-22 NOTE — Assessment & Plan Note (Addendum)
Continue aspirin, metoprolol, crestor, ntg.

## 2022-02-22 NOTE — Assessment & Plan Note (Signed)
Check testosterone levels.  Decrease testosterone shots to 100 mg once every 2 weeks.

## 2022-02-25 ENCOUNTER — Other Ambulatory Visit: Payer: Self-pay

## 2022-02-25 MED ORDER — TESTOSTERONE CYPIONATE 200 MG/ML IJ SOLN: 100.0000 mg | INTRAMUSCULAR | 2 refills | Status: DC

## 2022-03-10 ENCOUNTER — Telehealth: Payer: Self-pay

## 2022-03-10 NOTE — Progress Notes (Signed)
Care Management & Coordination Services Pharmacy Team  Reason for Encounter: Hypertension  Contacted patient on 03-10-2022 to discuss hypertension disease state.   Recent office visits:  02-16-2022 CoxElnita Maxwell, MD. MCH= 25.4, RDW= 16.4, Platelets= 142. Testosterone free= 4.4. START famotidine 20 mg at bedtime. Covid booster and PNA Vac given.  Recent consult visits:  None  Hospital visits:  None in previous 6 months  Medications: Outpatient Encounter Medications as of 03/10/2022  Medication Sig   amLODipine (NORVASC) 5 MG tablet TAKE 1 TABLET EVERY DAY   aspirin EC 81 MG tablet Take 81 mg by mouth daily.    dorzolamide-timolol (COSOPT) 22.3-6.8 MG/ML ophthalmic solution Place 1 drop into the right eye 2 (two) times daily.   eszopiclone (LUNESTA) 2 MG TABS tablet TAKE 1 TABLET IMMEDIATELY BEFORE BEDTIME AS NEEDED FOR SLEEP   famotidine (PEPCID) 20 MG tablet Take 1 tablet (20 mg total) by mouth at bedtime.   furosemide (LASIX) 20 MG tablet TAKE 1 TABLET EVERY DAY   Glycerin-Hypromellose-PEG 400 ([facility] DRY EYE RELIEF OP) Place 2 drops into both eyes daily as needed (for dry eyes).   HYDROcodone-acetaminophen (NORCO/VICODIN) 5-325 MG tablet Take 1 tablet by mouth every 6 (six) hours as needed for moderate pain.   latanoprost (XALATAN) 0.005 % ophthalmic solution Place 1 drop into the right eye at bedtime.   levothyroxine (SYNTHROID) 88 MCG tablet Take 1 tablet (88 mcg total) by mouth daily before breakfast.   meloxicam (MOBIC) 7.5 MG tablet TAKE 1 TABLET TWICE DAILY   metoprolol tartrate (LOPRESSOR) 25 MG tablet Take 1 tablet (25 mg total) by mouth 2 (two) times daily.   nitroGLYCERIN (NITROSTAT) 0.4 MG SL tablet Place 1 tablet (0.4 mg total) under the tongue every 5 (five) minutes as needed for chest pain.   omeprazole (PRILOSEC) 40 MG capsule TAKE 1 CAPSULE EVERY DAY   rosuvastatin (CRESTOR) 20 MG tablet TAKE 1 TABLET EVERY DAY   sildenafil (VIAGRA) 100 MG tablet Take 100 mg by mouth  daily as needed for erectile dysfunction.   tamsulosin (FLOMAX) 0.4 MG CAPS capsule TAKE 2 CAPSULES EVERY DAY   Testosterone Cypionate 200 MG/ML SOLN Inject 100 mg as directed every 14 (fourteen) days.   tiZANidine (ZANAFLEX) 4 MG tablet TAKE 1 TABLET EVERY 6 HOURS AS NEEDED FOR MUSCLE SPASM(S)   Vitamin D, Ergocalciferol, (DRISDOL) 1.25 MG (50000 UNIT) CAPS capsule TAKE 1 CAPSULE EVERY SUNDAY. (Patient taking differently: Take 50,000 Units by mouth every 7 (seven) days.)   Facility-Administered Encounter Medications as of 03/10/2022  Medication   testosterone cypionate (DEPOTESTOSTERONE CYPIONATE) injection 200 mg    Recent Office Vitals: BP Readings from Last 3 Encounters:  02/16/22 136/68  11/26/21 130/74  11/23/21 122/69   Pulse Readings from Last 3 Encounters:  02/16/22 68  11/26/21 (!) 58  11/23/21 62    Wt Readings from Last 3 Encounters:  02/16/22 211 lb (95.7 kg)  11/26/21 211 lb 9.6 oz (96 kg)  11/09/21 206 lb (93.4 kg)     Kidney Function Lab Results  Component Value Date/Time   CREATININE 1.23 02/16/2022 04:05 PM   CREATININE 1.08 11/09/2021 04:08 PM   GFRNONAA >60 09/03/2021 01:19 PM   GFRAA 74 12/14/2019 10:50 AM       Latest Ref Rng & Units 02/16/2022    4:05 PM 11/09/2021    4:08 PM 10/14/2021   12:00 AM  BMP  Glucose 70 - 99 mg/dL 87  74    BUN 8 - 27 mg/dL 13  11  13      Creatinine 0.76 - 1.27 mg/dL 1.23  1.08  1.1      BUN/Creat Ratio 10 - '24 11  10    '$ Sodium 134 - 144 mmol/L 140  140  138      Potassium 3.5 - 5.2 mmol/L 4.2  4.1  4.0      Chloride 96 - 106 mmol/L 103  104  101      CO2 20 - 29 mmol/L '23  19  29      '$ Calcium 8.6 - 10.2 mg/dL 9.7  9.5  12.0         This result is from an external source.     Current antihypertensive regimen:  Amlodipine 5 mg  daily Metoprolol Tart 25 mg BID  Patient verbally confirms he is taking the above medications as directed. Yes  How often are you checking your Blood Pressure? daily  he checks his  blood pressure in the morning before taking his medication.  Current home BP readings: 01-10 130/76 59, 01-09 136/75 62, 01-08 137/78 68, 01-07 134/78 67, 01-06 141/80 67 Wrist or arm cuff: Arm cuff Caffeine intake: Patient stated tea some days but it's been a while Salt intake: Limited   OTC medications including pseudoephedrine or NSAIDs?  Any readings above 180/120? No  What recent interventions/DTPs have been made by any provider to improve Blood Pressure control since last CPP Visit:  Educated on BP goals and benefits of medications for prevention of heart attack, stroke and kidney damage; -Counseled to monitor BP at home weekly, document, and provide log at future appointments -December 2022: Updated care plan -Recommended to continue current medication.   Any recent hospitalizations or ED visits since last visit with CPP? No  What diet changes have been made to improve Blood Pressure Control?  Patient stated he limits his sodium intake and drinks plenty of water daily.   What exercise is being done to improve your Blood Pressure Control?  Patient stated he walks some days and walks on a treadmill daily  Adherence Review: Is the patient currently on ACE/ARB medication? No Does the patient have >5 day gap between last estimated fill dates? Yes  Star Rating Drugs:  Rosuvastatin 20 mg- Last filled 02-18-2022 90 DS.  Previous 12-07-2021 90 DS.   Malecca Ishmael Holter

## 2022-03-14 ENCOUNTER — Other Ambulatory Visit: Payer: Self-pay | Admitting: Family Medicine

## 2022-03-15 ENCOUNTER — Other Ambulatory Visit: Payer: Self-pay | Admitting: Family Medicine

## 2022-03-15 DIAGNOSIS — N401 Enlarged prostate with lower urinary tract symptoms: Secondary | ICD-10-CM

## 2022-04-05 ENCOUNTER — Telehealth: Payer: Self-pay

## 2022-04-05 NOTE — Progress Notes (Signed)
Care Management & Coordination Services Pharmacy Team  Reason for Encounter: Appointment Reminder  Contacted patient to confirm telephone appointment with Arizona Constable, PharmD on 04/07/22 at 10:00am.  Unsuccessful outreach. Left voicemail for patient to return call. Left appt details on VM.   Chart review:  Recent office visits:  None  Recent consult visits:  None  Hospital visits:  None   Star Rating Drugs:  Medication:  Last Fill: Day Supply Rosuvastatin   02/18/22-12/07/21 90ds  Care Gaps: Annual wellness visit in last year? Yes, 11/17/21   Elray Mcgregor, [place] Pharmacist Assistant  [phone]

## 2022-04-07 ENCOUNTER — Ambulatory Visit: Payer: Medicare PPO

## 2022-04-07 ENCOUNTER — Telehealth: Payer: Medicare PPO

## 2022-04-07 NOTE — Patient Outreach (Signed)
Care Management & Coordination Services Pharmacy Note  04/07/2022 Name:  Mark Mejia MRN:  [id number] DOB:  Jul 24, 1955  Summary: -Pleasant 67 year old male presents for f/u CCM visit. He was born in [place] and moved to [place] in 1977 when he joined the [employer] and was stationed in [place]. We spoke extensively about our time in the service. Patient is also a Engineer, maintenance and loves watching them, however he has trouble staying up through the whole game -Is going to [place] this week for his birthday, his favourite restaurant   Recommendations/Changes made from today's visit: -Counseled patient to not sleep on right side. If he does sleep, to be upright and if he needs to be on his side to do the left side  Subjective: Mark Mejia is an 67 y.o. year old male who is a primary patient of Cox, Kirsten, MD.  The care coordination team was consulted for assistance with disease management and care coordination needs.    Engaged with patient by telephone for follow up visit.   Recent office visits:  02-16-2022 CoxElnita Maxwell, MD. MCH= 25.4, RDW= 16.4, Platelets= 142. Testosterone free= 4.4. START famotidine 20 mg at bedtime. Covid booster and PNA Vac given.   Recent consult visits:  None   Hospital visits:  None in previous 6 months   Objective:  Lab Results  Component Value Date   CREATININE 1.23 02/16/2022   BUN 13 02/16/2022   EGFR 64 02/16/2022   GFRNONAA >60 09/03/2021   GFRAA 74 12/14/2019   NA 140 02/16/2022   K 4.2 02/16/2022   CALCIUM 9.7 02/16/2022   CO2 23 02/16/2022   GLUCOSE 87 02/16/2022    No results found for: "HGBA1C", "FRUCTOSAMINE", "GFR", "MICROALBUR"  Last diabetic Eye exam: No results found for: "HMDIABEYEEXA"  Last diabetic Foot exam: No results found for: "HMDIABFOOTEX"   Lab Results  Component Value Date   CHOL 114 11/09/2021   HDL 32 (L) 11/09/2021   LDLCALC 64 11/09/2021   TRIG 92 11/09/2021   CHOLHDL 3.6 11/09/2021       Latest Ref Rng  & Units 02/16/2022    4:05 PM 11/09/2021    4:08 PM 10/14/2021   12:00 AM  Hepatic Function  Total Protein 6.0 - 8.5 g/dL 7.9  7.9    Albumin 3.9 - 4.9 g/dL 4.6  4.8  4.6      AST 0 - 40 IU/L 34  31  40      ALT 0 - 44 IU/L 24  23  29      $ Alk Phosphatase 44 - 121 IU/L 61  73  61      Total Bilirubin 0.0 - 1.2 mg/dL 0.5  0.5       This result is from an external source.    Lab Results  Component Value Date/Time   TSH 6.480 (H) 08/04/2021 09:59 AM   TSH 4.390 11/26/2020 12:07 PM       Latest Ref Rng & Units 02/16/2022    4:05 PM 11/09/2021    4:08 PM 10/14/2021   12:00 AM  CBC  WBC 3.4 - 10.8 x10E3/uL 4.2  4.5  4.3      Hemoglobin 13.0 - 17.7 g/dL 14.5  13.9  13.8      Hematocrit 37.5 - 51.0 % 44.8  42.5  41      Platelets 150 - 450 x10E3/uL 142  141  137  This result is from an external source.    Lab Results  Component Value Date/Time   VITAMINB12 670 10/14/2021 12:00 AM   VITAMINB12 731 08/04/2021 09:59 AM    Clinical ASCVD: Yes  The ASCVD Risk score (Arnett DK, et al., 2019) failed to calculate for the following reasons:   The valid total cholesterol range is 130 to 320 mg/dL    Other: (CHADS2VASc if Afib, MMRC or CAT for COPD, ACT, DEXA)     02/16/2022    3:11 PM 11/20/2021    9:42 AM 02/17/2021   11:15 AM  Depression screen PHQ 2/9  Decreased Interest 0 0 0  Down, Depressed, Hopeless 0 0 0  PHQ - 2 Score 0 0 0     Social History   Tobacco Use  Smoking Status Never  Smokeless Tobacco Never   BP Readings from Last 3 Encounters:  02/16/22 136/68  11/26/21 130/74  11/23/21 122/69   Pulse Readings from Last 3 Encounters:  02/16/22 68  11/26/21 (!) 58  11/23/21 62   Wt Readings from Last 3 Encounters:  02/16/22 211 lb (95.7 kg)  11/26/21 211 lb 9.6 oz (96 kg)  11/09/21 206 lb (93.4 kg)   BMI Readings from Last 3 Encounters:  02/16/22 30.28 kg/m  11/26/21 30.36 kg/m  11/09/21 29.56 kg/m    No Known Allergies  Medications  Reviewed Today     Reviewed by Rochel Brome, MD (Physician) on 02/22/22 at 2232  Med List Status: <None>   Medication Order Taking? Sig Documenting Provider Last Dose Status Informant  amLODipine (NORVASC) 5 MG tablet [id number] Yes TAKE 1 TABLET EVERY DAY Cox, Kirsten, MD Taking Active   aspirin EC 81 MG tablet [id number] Yes Take 81 mg by mouth daily.  [provider] Taking Active Self  dorzolamide-timolol (COSOPT) 22.3-6.8 MG/ML ophthalmic solution [id number] Yes Place 1 drop into the right eye 2 (two) times daily. [provider] Taking Active Self  eszopiclone (LUNESTA) 2 MG TABS tablet [id number] Yes TAKE 1 TABLET IMMEDIATELY BEFORE BEDTIME AS NEEDED FOR SLEEP Cox, Kirsten, MD Taking Active   famotidine (PEPCID) 20 MG tablet [id number] Yes Take 1 tablet (20 mg total) by mouth at bedtime. Cox, Kirsten, MD  Active   furosemide (LASIX) 20 MG tablet [id number] Yes TAKE 1 TABLET EVERY DAY Cox, Kirsten, MD Taking Active   Glycerin-Hypromellose-PEG 400 ([facility] DRY EYE RELIEF OP) [id number] Yes Place 2 drops into both eyes daily as needed (for dry eyes). [provider] Taking Active Self  HYDROcodone-acetaminophen (NORCO/VICODIN) 5-325 MG tablet [id number] Yes Take 1 tablet by mouth every 6 (six) hours as needed for moderate pain. Cox, Kirsten, MD Taking Active Self  latanoprost (XALATAN) 0.005 % ophthalmic solution [id number] Yes Place 1 drop into the right eye at bedtime. [provider] Taking Active Self  levothyroxine (SYNTHROID) 88 MCG tablet [id number] Yes Take 1 tablet (88 mcg total) by mouth daily before breakfast. Cox, Kirsten, MD Taking Active   meloxicam (MOBIC) 7.5 MG tablet [id number] Yes TAKE 1 TABLET TWICE DAILY Cox, Kirsten, MD Taking Active   metoprolol tartrate (LOPRESSOR) 25 MG tablet [id number] Yes Take 1 tablet (25 mg total) by mouth 2 (two) times daily. Park Liter, MD Taking Active   nitroGLYCERIN (NITROSTAT) 0.4 MG SL tablet [id number] Yes Place 1  tablet (0.4 mg total) under the tongue every 5 (five) minutes as needed for chest pain. Park Liter, MD Taking Active   omeprazole (PRILOSEC) 40 MG capsule [id number] Yes TAKE 1  CAPSULE EVERY DAY Park Liter, MD Taking Active   rosuvastatin (CRESTOR) 20 MG tablet [id number] Yes TAKE 1 TABLET EVERY DAY Bettina Gavia Hilton Cork, MD Taking Active   sildenafil (VIAGRA) 100 MG tablet [id number] Yes Take 100 mg by mouth daily as needed for erectile dysfunction. [provider] Taking Active Self  tamsulosin (FLOMAX) 0.4 MG CAPS capsule [id number] Yes TAKE 2 CAPSULES EVERY DAY Cox, Kirsten, MD Taking Active   testosterone cypionate (DEPOTESTOSTERONE CYPIONATE) injection 200 mg [id number]   Rochel Brome, MD  Active   Testosterone Cypionate 200 MG/ML SOLN [id number] Yes Inject 200 mg as directed every 14 (fourteen) days. Cox, Kirsten, MD Taking Active   tiZANidine (ZANAFLEX) 4 MG tablet [id number] Yes TAKE 1 TABLET EVERY 6 HOURS AS NEEDED FOR MUSCLE SPASM(S) Cox, Kirsten, MD Taking Active   Vitamin D, Ergocalciferol, (DRISDOL) 1.25 MG (50000 UNIT) CAPS capsule [id number] Yes TAKE 1 CAPSULE EVERY SUNDAY.  Patient taking differently: Take 50,000 Units by mouth every 7 (seven) days.   CoxElnita Maxwell, MD Taking Active Self            SDOH:  (Social Determinants of Health) assessments and interventions performed: Yes SDOH Interventions    Flowsheet Row Care Coordination from 04/07/2022 in [facility] from 10/01/2021 in [facility] from 04/16/2021 in [facility] from 02/17/2021 in [facility] from 12/25/2020 in [facility]  SDOH Interventions       Food Insecurity Interventions -- -- -- Intervention Not Indicated --  Housing Interventions -- -- -- Intervention Not Indicated --  Transportation Interventions Intervention  Not Indicated Intervention Not Indicated Intervention Not Indicated Intervention Not Indicated --  Financial Strain Interventions Intervention Not Indicated Intervention Not Indicated Intervention Not Indicated -- Intervention Not Indicated       Medication Assistance: None required.  Patient affirms current coverage meets needs.   Name and location of Current pharmacy:  [place], [address] [place] [address] Phone: [phone] Fax: [phone]  [place], [address] [place] [address] Phone: [phone] Fax: [phone]   Compliance/Adherence/Medication fill history: [facility]:  Rosuvastatin 20 mg- Last filled 02-18-2022 90 DS.  Previous 12-07-2021 90 DS.   Assessment/Plan  Heart Failure (Goal: manage symptoms and prevent exacerbations) -Managed by Dr. Agustin Cree BP Readings from Last 3 Encounters:  02/16/22 136/68  11/26/21 130/74  11/23/21 122/69   -Controlled -HF type: HFpEF (EF > 50%) -Current treatment: Amlodipine 73m Appropriate, Effective, Safe, Accessible Metoprolol Tart 255mBID Appropriate, Effective, Safe, Accessible -Medications previously tried: N/A  -Current home BP/HR readings:  August 2023 137/79 130/82 131/80 Feb 2024: 123/74 HR: 54 122/71 HR: 70 133/78 HR: 68 126/75 HR: 61 -Current home daily weights: Not doing -Current dietary habits: "Tries to eat healthy" -Current exercise habits: Patient stated he walks some days and walks on a treadmill daily -Educated on Benefits of medications for managing symptoms and prolonging life -Recommended to continue current medication    CAD (LDL goal < 100) The ASCVD Risk score (Arnett DK, et al., 2019) failed to calculate for the following reasons:   The valid total cholesterol range is 130 to 320 mg/dL Lab Results  Component Value Date   CHOL 114 11/09/2021   CHOL 75 (  L)  08/04/2021   CHOL 94 (L) 01/02/2021   Lab Results  Component Value Date   HDL 32 (L) 11/09/2021   HDL 26 (L) 08/04/2021   HDL 24 (L) 01/02/2021   Lab Results  Component Value Date   LDLCALC 64 11/09/2021   LDLCALC 35 08/04/2021   LDLCALC 52 01/02/2021   Lab Results  Component Value Date   TRIG 92 11/09/2021   TRIG 58 08/04/2021   TRIG 89 01/02/2021   Lab Results  Component Value Date   CHOLHDL 3.6 11/09/2021   CHOLHDL 2.9 08/04/2021   CHOLHDL 3.9 01/02/2021   No results found for: "LDLDIRECT" Last vitamin D No results found for: "25OHVITD2", "25OHVITD3", "VD25OH" Lab Results  Component Value Date   TSH 6.480 (H) 08/04/2021  -Controlled -Current treatment: ASA 73m Appropriate, Effective, Safe, Accessible Rosuvastatin 235mAppropriate, Effective, Safe, Accessible -Medications previously tried: None  -Current dietary habits: "Tries to eat healthy" -Current exercise habits: None due to pain -Educated on Cholesterol goals;  October 2022: Counseled to take ASA separate from Meloxicam, will take ASA PM and meloxicam first thing in AM -Recommended to continue current medication.    BPH (Goal: improve urination) -Not ideally controlled -Night time urination frequency:  October 2022: 2-3x/night but 8x/night without meds August 2023: 1-2x/night Feb 2024: 2x/night -Current treatment: Tamsulosin 0.69m669mS Appropriate, Effective, Safe, Accessible Kegels -Medications previously tried: N/A October 2022: Counsled on Kegels as supplemental therapy August 2023: Patient stated he's still doing kegel exercises and that he only gets up to pee once/night now. Occasionally it'll be 2x/night but only if he drank a lot that day and it's very rare Feb 2024: Not doing Kegel exercises   Chronic Pain -Controlled -Current treatment: Meloxicam 71m7m Appropriate, Effective, Safe, Accessible APAP Appropriate, Effective, Safe, Accessible Hydrocodone/APAP (Takes 1/6 months)  Appropriate, Effective, Safe, Accessible Tizanidine 69mg 48mropriate, Effective, Safe, Accessible -Medications previously tried:  -Pain Scale If pain is 3-4/10, will take APAP and bring to 1-2/10. If 8-9/10, will take Tizanidine and bring to 1-2/10.  Aggravating Factors: Lack of sleep, movement Pain Type: musculo-skeletal  -Recommended to continue current medication. Counseled not to take APAP and Hydro/APAP together   Thyroid (Goal TSH: 0.4-4.5 Lab Results  Component Value Date   TSH 6.480 (H) 08/04/2021  -Is patient taking Biotin supplement No  -Not ideally controlled -Current treatment: Levothyroxine 88mcg36mropriate, Effective, Safe, Accessible -Counseled to take medication on an empty stomach -Recommended to continue current medication  GERD (Goal: minimize symptoms of reflux ) -Uncontrolled -Current treatment  Omeprazole 40mg A106mpriate, Effective, Safe, Accessible Famotidine 20mg HS71mropriate, Effective, Safe, Accessible -Medications previously tried: none reported  -Triggering factors: sleep -Hx of Bleeds/ulcers: No per patient -Counseled on small meals, elevating head, and sleeping on left side Feb 2024: Sleeping on right side. Counseled to sleep on left and upright   CPP F/U Feb 2024  Zackeriah Kissler KArizona ConstableD.[email or web address]) [phone]

## 2022-04-08 DIAGNOSIS — H401113 Primary open-angle glaucoma, right eye, severe stage: Secondary | ICD-10-CM | POA: Diagnosis not present

## 2022-04-16 ENCOUNTER — Other Ambulatory Visit: Payer: Self-pay | Admitting: Family Medicine

## 2022-04-21 ENCOUNTER — Ambulatory Visit: Payer: Medicare PPO | Attending: Cardiology | Admitting: Cardiology

## 2022-04-21 ENCOUNTER — Encounter: Payer: Self-pay | Admitting: Cardiology

## 2022-04-21 VITALS — BP 122/70 | HR 66 | Ht 70.0 in | Wt 213.0 lb

## 2022-04-21 DIAGNOSIS — E782 Mixed hyperlipidemia: Secondary | ICD-10-CM

## 2022-04-21 DIAGNOSIS — I739 Peripheral vascular disease, unspecified: Secondary | ICD-10-CM | POA: Diagnosis not present

## 2022-04-21 DIAGNOSIS — I110 Hypertensive heart disease with heart failure: Secondary | ICD-10-CM | POA: Diagnosis not present

## 2022-04-21 DIAGNOSIS — I5030 Unspecified diastolic (congestive) heart failure: Secondary | ICD-10-CM

## 2022-04-21 DIAGNOSIS — I2510 Atherosclerotic heart disease of native coronary artery without angina pectoris: Secondary | ICD-10-CM | POA: Diagnosis not present

## 2022-04-21 NOTE — Addendum Note (Signed)
Addended by: Jacobo Forest D on: 04/21/2022 04:26 PM   Modules accepted: Orders

## 2022-04-21 NOTE — Progress Notes (Signed)
Cardiology Office Note:    Date:  04/21/2022   ID:  Mark Mejia, DOB 1955/10/03, MRN [id number]  PCP:  Mark Brome, MD  Cardiologist:  Mark Campus, MD    Referring MD: Mark Brome, MD   No chief complaint on file. Doing well  History of Present Illness:    Mark Mejia is a 67 y.o. male  with past medical history significant for atypical chest pain, essential hypertension, dyslipidemia, carotic arterial disease up to 69% stenosis on the left side. He presented to me last time with atypical symptoms he was playing basketball became very hot started having some chest pain. We eventually ended up doing coronary CT angio and coronary CT angio showed only very minimal disease 0 to 25%. He did have cardiac catheterization few years ago which show only nonobstructive disease so I think we can conclude that his symptomatology was likely not related to his heart problems  Comes today to months for follow-up.  Overall doing very well.  He denies any chest pain tightness squeezing pressure burning chest.  He did enjoy his retirement.  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[place]) 06/14/2019   Hypertrophic scar of skin 09/29/2016   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   Mixed hyperlipidemia    Peripheral vascular disease, asymptomatic ([place])  05/23/2017   80-99% stenosis in the left internal coronary artery   Precordial chest pain 12/26/2015   Shortness of breath 12/26/2015   Testicular hypofunction    Vitamin D deficiency     Past Surgical History:  Procedure Laterality Date   GASTROSTOMY W/ FEEDING TUBE     LEFT HEART CATH AND CORONARY ANGIOGRAPHY N/A 05/25/2017   Procedure: LEFT HEART CATH AND CORONARY ANGIOGRAPHY;  Surgeon: Martinique, Peter M, MD;  Location: [facility];  Service: Cardiovascular;  Laterality: N/A;   PORTACATH PLACEMENT     THROAT SURGERY      Current Medications: Current Meds  Medication Sig   amLODipine (NORVASC) 5 MG tablet TAKE 1 TABLET EVERY DAY   aspirin EC 81 MG tablet Take 81 mg by mouth daily.    dorzolamide-timolol (COSOPT) 22.3-6.8 MG/ML ophthalmic solution Place 1 drop into the right eye 2 (two) times daily.   eszopiclone (LUNESTA) 2 MG TABS tablet TAKE 1 TABLET IMMEDIATELY BEFORE BEDTIME AS NEEDED FOR SLEEP   famotidine (PEPCID) 20 MG tablet Take 1 tablet (20 mg total) by mouth at bedtime.   furosemide (LASIX) 20 MG tablet TAKE 1 TABLET EVERY DAY   Glycerin-Hypromellose-PEG 400 ([facility] DRY EYE RELIEF OP) Place 2 drops into both eyes daily as needed (for dry eyes).   HYDROcodone-acetaminophen (NORCO/VICODIN) 5-325 MG tablet Take 1 tablet by mouth every 6 (six) hours as needed for moderate pain.   latanoprost (XALATAN) 0.005 % ophthalmic solution Place 1 drop into the right eye  at bedtime.   levothyroxine (SYNTHROID) 88 MCG tablet TAKE 1 TABLET DAILY BEFORE BREAKFAST.   meloxicam (MOBIC) 7.5 MG tablet TAKE 1 TABLET TWICE DAILY   metoprolol tartrate (LOPRESSOR) 25 MG tablet Take 1 tablet (25 mg total) by mouth 2 (two) times daily.   nitroGLYCERIN (NITROSTAT) 0.4 MG SL tablet Place 1 tablet (0.4 mg total) under the tongue every 5 (five) minutes as needed for chest pain.   omeprazole (PRILOSEC) 40 MG capsule TAKE 1 CAPSULE EVERY DAY   rosuvastatin (CRESTOR) 20 MG tablet TAKE 1 TABLET EVERY DAY    sildenafil (VIAGRA) 100 MG tablet Take 100 mg by mouth daily as needed for erectile dysfunction.   tamsulosin (FLOMAX) 0.4 MG CAPS capsule TAKE 2 CAPSULES EVERY DAY   Testosterone Cypionate 200 MG/ML SOLN Inject 100 mg as directed every 14 (fourteen) days.   tiZANidine (ZANAFLEX) 4 MG tablet TAKE 1 TABLET EVERY 6 HOURS AS NEEDED FOR MUSCLE SPASM(S)   Vitamin D, Ergocalciferol, (DRISDOL) 1.25 MG (50000 UNIT) CAPS capsule TAKE 1 CAPSULE EVERY SUNDAY.   Current Facility-Administered Medications for the 04/21/22 encounter (Office Visit) with Mark Liter, MD  Medication   testosterone cypionate (DEPOTESTOSTERONE CYPIONATE) injection 200 mg     Allergies:   Patient has no known allergies.   Social History   Socioeconomic History   Marital status: Married    Spouse name: Mark Mejia   Number of children: 1   Years of education: college   Highest education level: Not on file  Occupational History   Occupation: Retired  Tobacco Use   Smoking status: Never   Smokeless tobacco: Never  Vaping Use   Vaping Use: Never used  Substance and Sexual Activity   Alcohol use: No   Drug use: No   Sexual activity: Not on file  Other Topics Concern   Not on file  Social History Narrative   Currently retired/disabled   Social Determinants of Health   Financial Resource Strain: Low Risk  (04/07/2022)   Overall Financial Resource Strain (CARDIA)    Difficulty of Paying Living Expenses: Not hard at all  Food Insecurity: No Food Insecurity (02/17/2021)   Hunger Vital Sign    Worried About Running Out of Food in the Last Year: Never true    [place] in the Last Year: Never true  Transportation Needs: No Transportation Needs (04/07/2022)   PRAPARE - Hydrologist (Medical): No    Lack of Transportation (Non-Medical): No  Physical Activity: Not on file  Stress: Not on file  Social Connections: Not on file     Family History: The patient's family history  includes Arthritis in his mother; Cancer - Other in his maternal grandmother; Hypertension in his mother. ROS:   Please see the history of present illness.    All 14 point review of systems negative except as described per history of present illness  EKGs/Labs/Other Studies Reviewed:      Recent Labs: 08/04/2021: TSH 6.480 02/16/2022: ALT 24; BUN 13; Creatinine, Ser 1.23; Hemoglobin 14.5; Platelets 142; Potassium 4.2; Sodium 140  Recent Lipid Panel    Component Value Date/Time   CHOL 114 11/09/2021 1608   TRIG 92 11/09/2021 1608   HDL 32 (L) 11/09/2021 1608   CHOLHDL 3.6 11/09/2021 1608   LDLCALC 64 11/09/2021 1608    Physical Exam:    VS:  BP 122/70 (BP Location: Left Arm, Patient Position: Sitting)   Pulse 66   Ht 5' 10"$  (1.778  Mejia)   Wt 213 lb (96.6 kg)   SpO2 98%   BMI 30.56 kg/Mejia     Wt Readings from Last 3 Encounters:  04/21/22 213 lb (96.6 kg)  02/16/22 211 lb (95.7 kg)  11/26/21 211 lb 9.6 oz (96 kg)     GEN:  Well nourished, well developed in no acute distress HEENT: Normal NECK: No JVD; No carotid bruits LYMPHATICS: No lymphadenopathy CARDIAC: RRR, no murmurs, no rubs, no gallops RESPIRATORY:  Clear to auscultation without rales, wheezing or rhonchi  ABDOMEN: Soft, non-tender, non-distended MUSCULOSKELETAL:  No edema; No deformity  SKIN: Warm and dry LOWER EXTREMITIES: no swelling NEUROLOGIC:  Alert and oriented x 3 PSYCHIATRIC:  Normal affect   ASSESSMENT:    1. Coronary artery disease involving native coronary artery of native heart without angina pectoris   2. 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 Mixed hyperlipidemia   4. Peripheral vascular disease, asymptomatic ([place])    PLAN:    In order of problems listed above:  Coronary disease stable only minimal on coronary CT angio.  On appropriate medication which include antiplatelets therapy as well as statin. Dyslipidemia I did review K PN which show me data from  November 09, 2021 LDL 64 HDL 32.  He is taking Crestor 20 which I will continue. Peripheral vascular disease concern about carotic arterial disease.  Appropriate medical therapy will schedule him to have carotic ultrasounds in April   Medication Adjustments/Labs and Tests Ordered: Current medicines are reviewed at length with the patient today.  Concerns regarding medicines are outlined above.  No orders of the defined types were placed in this encounter.  Medication changes: No orders of the defined types were placed in this encounter.   Signed, Mark Liter, MD, [facility] 04/21/2022 4:08 PM    [facility]

## 2022-04-21 NOTE — Patient Instructions (Addendum)
Medication Instructions:  Your physician recommends that you continue on your current medications as directed. Please refer to the Current Medication list given to you today.  *If you need a refill on your cardiac medications before your next appointment, please call your pharmacy*   Lab Work: None Ordered If you have labs (blood work) drawn today and your tests are completely normal, you will receive your results only by: [place] (if you have MyChart) OR A paper copy in the mail If you have any lab test that is abnormal or we need to change your treatment, we will call you to review the results.   Testing/Procedures: Your physician has requested that you have a carotid duplex. This test is an ultrasound of the carotid arteries in your neck. It looks at blood flow through these arteries that supply the brain with blood. Allow one hour for this exam. There are no restrictions or special instructions.    Follow-Up: At [facility], you and your health needs are our priority.  As part of our continuing mission to provide you with exceptional heart care, we have created designated Provider Care Teams.  These Care Teams include your primary Cardiologist (physician) and Advanced Practice Providers (APPs -  Physician Assistants and Nurse Practitioners) who all work together to provide you with the care you need, when you need it.  We recommend signing up for the patient portal called "MyChart".  Sign up information is provided on this After Visit Summary.  MyChart is used to connect with patients for Virtual Visits (Telemedicine).  Patients are able to view lab/test results, encounter notes, upcoming appointments, etc.  Non-urgent messages can be sent to your provider as well.   To learn more about what you can do with MyChart, go to [email or web address].    Your next appointment:   12 month(s)  The format for your next appointment:   In Person  Provider:   Jenne Campus, MD     Other Instructions NA

## 2022-04-26 ENCOUNTER — Telehealth: Payer: Self-pay

## 2022-04-26 ENCOUNTER — Encounter: Payer: Self-pay | Admitting: Family Medicine

## 2022-04-26 NOTE — Telephone Encounter (Signed)
Patient wife sent my chart message (see in chart) called patient wife back and spoke to her she stated his fever was 99.0, light headed, and cold chills. Stated this is happening every 4-6 weeks. Made patient an appointment for Thursday with Dr. Tobie Poet and recommended patient wife test him for Covid and if his symptoms get worst to take him to the nearest urgent care or ER.

## 2022-04-29 ENCOUNTER — Ambulatory Visit: Payer: Medicare PPO | Admitting: Family Medicine

## 2022-05-01 ENCOUNTER — Other Ambulatory Visit: Payer: Self-pay | Admitting: Family Medicine

## 2022-05-01 ENCOUNTER — Other Ambulatory Visit: Payer: Self-pay | Admitting: Cardiology

## 2022-05-04 ENCOUNTER — Ambulatory Visit: Payer: Medicare PPO | Attending: Cardiology

## 2022-05-04 DIAGNOSIS — I6523 Occlusion and stenosis of bilateral carotid arteries: Secondary | ICD-10-CM

## 2022-05-04 DIAGNOSIS — I739 Peripheral vascular disease, unspecified: Secondary | ICD-10-CM

## 2022-05-04 DIAGNOSIS — I2510 Atherosclerotic heart disease of native coronary artery without angina pectoris: Secondary | ICD-10-CM | POA: Diagnosis not present

## 2022-05-06 ENCOUNTER — Telehealth: Payer: Self-pay

## 2022-05-06 ENCOUNTER — Ambulatory Visit (INDEPENDENT_AMBULATORY_CARE_PROVIDER_SITE_OTHER): Payer: Medicare PPO | Admitting: Family Medicine

## 2022-05-06 VITALS — BP 130/78 | HR 68 | Temp 97.6°F | Wt 209.0 lb

## 2022-05-06 DIAGNOSIS — R509 Fever, unspecified: Secondary | ICD-10-CM

## 2022-05-06 LAB — POCT URINALYSIS DIP (CLINITEK)
Bilirubin, UA: NEGATIVE
Blood, UA: NEGATIVE
Glucose, UA: NEGATIVE mg/dL
Ketones, POC UA: NEGATIVE mg/dL
Leukocytes, UA: NEGATIVE
Nitrite, UA: NEGATIVE
POC PROTEIN,UA: NEGATIVE
Spec Grav, UA: 1.02 (ref 1.010–1.025)
Urobilinogen, UA: 0.2 E.U./dL
pH, UA: 6 (ref 5.0–8.0)

## 2022-05-06 NOTE — Progress Notes (Signed)
Acute Office Visit  Subjective:    Patient ID: Mark Mejia, male    DOB: [birth year], 67 y.o.   MRN: [id number]  Chief Complaint  Patient presents with   Chills    HPI: Patient is in today for fever ranging 101-102, lightheadedness and chills which primarily happen in the evenings and last for a few hours. Denies cough, shob, chest pain. Symptoms come and go over the past 2.5 months. Symptoms have happened 3-4 times in the last 2.5 months.  It last happened in 2 weeks. Took a covid 19 test and was negative.   BP taken 2.5 weeks ago showed 98/46,61 (laying), 118/69, 67 (sitting),  140/83, 64, 124/79, 66. All separated by at least 30 minutes.  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   Hypertrophic scar of skin 09/29/2016   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   Mixed hyperlipidemia    Peripheral vascular disease, asymptomatic ([place]) 05/23/2017   80-99% stenosis in the left internal coronary artery   Precordial chest pain 12/26/2015   Shortness of breath 12/26/2015   Testicular hypofunction    Vitamin D deficiency     Past Surgical History:  Procedure Laterality Date   GASTROSTOMY W/ FEEDING TUBE     LEFT HEART CATH AND CORONARY ANGIOGRAPHY N/A 05/25/2017   Procedure: LEFT HEART CATH AND CORONARY ANGIOGRAPHY;  Surgeon: Martinique, Peter M, MD;  Location: [facility];  Service:  Cardiovascular;  Laterality: N/A;   PORTACATH PLACEMENT     THROAT SURGERY      Family History  Problem Relation Age of Onset   Arthritis Mother    Hypertension Mother    Cancer - Other Maternal Grandmother     Social History   Socioeconomic History   Marital status: Married    Spouse name: [name]   Number of children: 1   Years of education: college   Highest education level: Not on file  Occupational History   Occupation: Retired  Tobacco Use   Smoking status: Never   Smokeless tobacco: Never  Vaping Use   Vaping Use: Never used  Substance and Sexual Activity   Alcohol use: No   Drug use: No   Sexual activity: Not on file  Other Topics Concern   Not on file  Social History Narrative   Currently retired/disabled   Social Determinants of Health   Financial Resource Strain: Low Risk  (04/07/2022)   Overall Financial Resource Strain (CARDIA)    Difficulty of Paying Living Expenses: Not hard at all  Food Insecurity: No Food Insecurity (02/17/2021)   Hunger Vital Sign    Worried About Running Out of Food in the Last Year: Never true    [place] in the Last Year: Never true  Transportation Needs: No Transportation Needs (04/07/2022)   PRAPARE - Hydrologist (Medical): No  Lack of Transportation (Non-Medical): No  Physical Activity: Not on file  Stress: Not on file  Social Connections: Not on file  Intimate Partner Violence: Not At Risk (02/17/2021)   Humiliation, Afraid, Rape, and Kick questionnaire    Fear of Current or Ex-Partner: No    Emotionally Abused: No    Physically Abused: No    Sexually Abused: No    Outpatient Medications Prior to Visit  Medication Sig Dispense Refill   amLODipine (NORVASC) 5 MG tablet TAKE 1 TABLET EVERY DAY 90 tablet 0   aspirin EC 81 MG tablet Take 81 mg by mouth daily.      dorzolamide-timolol (COSOPT) 22.3-6.8 MG/ML ophthalmic solution Place 1 drop into the right eye 2 (two) times daily.      eszopiclone (LUNESTA) 2 MG TABS tablet TAKE 1 TABLET IMMEDIATELY BEFORE BEDTIME AS NEEDED FOR SLEEP 30 tablet 5   famotidine (PEPCID) 20 MG tablet Take 1 tablet (20 mg total) by mouth at bedtime. 90 tablet 1   furosemide (LASIX) 20 MG tablet TAKE 1 TABLET EVERY DAY 90 tablet 1   Glycerin-Hypromellose-PEG 400 ([facility] DRY EYE RELIEF OP) Place 2 drops into both eyes daily as needed (for dry eyes).     HYDROcodone-acetaminophen (NORCO/VICODIN) 5-325 MG tablet Take 1 tablet by mouth every 6 (six) hours as needed for moderate pain. 30 tablet 0   latanoprost (XALATAN) 0.005 % ophthalmic solution Place 1 drop into the right eye at bedtime.     levothyroxine (SYNTHROID) 88 MCG tablet TAKE 1 TABLET DAILY BEFORE BREAKFAST. 90 tablet 1   meloxicam (MOBIC) 7.5 MG tablet TAKE 1 TABLET TWICE DAILY 180 tablet 1   metoprolol tartrate (LOPRESSOR) 25 MG tablet Take 1 tablet (25 mg total) by mouth 2 (two) times daily. 180 tablet 2   nitroGLYCERIN (NITROSTAT) 0.4 MG SL tablet Place 1 tablet (0.4 mg total) under the tongue every 5 (five) minutes as needed for chest pain. 25 tablet 1   omeprazole (PRILOSEC) 40 MG capsule TAKE 1 CAPSULE EVERY DAY 90 capsule 1   rosuvastatin (CRESTOR) 20 MG tablet TAKE 1 TABLET EVERY DAY 90 tablet 3   sildenafil (VIAGRA) 100 MG tablet TAKE 1/2 TO 1 TABLET EVERY DAY 30-60 MIN PRIOR TO INTERCOURSE AS NEEDED FOR ERECTILE DYSFUNCTION AS DIRECTED 10 tablet 0   tamsulosin (FLOMAX) 0.4 MG CAPS capsule TAKE 2 CAPSULES EVERY DAY 180 capsule 0   Testosterone Cypionate 200 MG/ML SOLN Inject 100 mg as directed every 14 (fourteen) days. 5 mL 2   tiZANidine (ZANAFLEX) 4 MG tablet TAKE 1 TABLET EVERY 6 HOURS AS NEEDED FOR MUSCLE SPASM(S) 90 tablet 3   Vitamin D, Ergocalciferol, (DRISDOL) 1.25 MG (50000 UNIT) CAPS capsule TAKE 1 CAPSULE EVERY SUNDAY. 12 capsule 0   Facility-Administered Medications Prior to Visit  Medication Dose Route Frequency Provider Last Rate Last Admin   testosterone cypionate  (DEPOTESTOSTERONE CYPIONATE) injection 200 mg  200 mg Intramuscular Q14 Days Kenai Fluegel, MD   200 mg at 12/30/21 1605    No Known Allergies  Review of Systems  Constitutional:  Positive for chills and fever. Negative for diaphoresis and fatigue.  HENT:  Negative for congestion, ear pain, postnasal drip, sinus pain and sore throat.   Respiratory:  Negative for cough and shortness of breath.   Cardiovascular:  Negative for chest pain and leg swelling.  Gastrointestinal:  Negative for constipation, diarrhea, nausea and vomiting.  Genitourinary:  Negative for dysuria and urgency.  Neurological:  Negative for dizziness and headaches.  Objective:        05/06/2022   11:24 AM 04/21/2022    3:44 PM 02/16/2022    3:07 PM  Vitals with BMI  Height  '5\' 10"'$  '5\' 10"'$   Weight 209 lbs 213 lbs 211 lbs  BMI 29.99 [id number]  Systolic [id number]  Diastolic 78 70 68  Pulse 68 66 68    No data found.   Physical Exam Constitutional:      Appearance: Normal appearance.  HENT:     Right Ear: Tympanic membrane, ear canal and external ear normal.     Left Ear: Tympanic membrane, ear canal and external ear normal.     Nose: Nose normal. No congestion or rhinorrhea.     Mouth/Throat:     Mouth: Mucous membranes are moist.     Pharynx: No oropharyngeal exudate or posterior oropharyngeal erythema.  Cardiovascular:     Rate and Rhythm: Normal rate and regular rhythm.     Heart sounds: Normal heart sounds.  Pulmonary:     Effort: Pulmonary effort is normal. No respiratory distress.     Breath sounds: Normal breath sounds. No wheezing, rhonchi or rales.  Abdominal:     General: Bowel sounds are normal.     Palpations: Abdomen is soft.     Tenderness: There is no abdominal tenderness.  Lymphadenopathy:     Cervical: No cervical adenopathy.  Neurological:     Mental Status: He is alert.  Psychiatric:        Mood and Affect: Mood normal.        Behavior: Behavior normal.     Health  Maintenance Due  Topic Date Due   Zoster Vaccines- Shingrix (1 of 2) Never done   DTaP/Tdap/Td (2 - Td or Tdap) 05/30/2021   COVID-19 Vaccine (6 - 2023-24 season) 04/13/2022    There are no preventive care reminders to display for this patient.   Lab Results  Component Value Date   TSH 3.280 05/06/2022   Lab Results  Component Value Date   WBC 3.9 05/06/2022   HGB 13.0 05/06/2022   HCT 38.7 05/06/2022   MCV 81 05/06/2022   PLT 152 05/06/2022   Lab Results  Component Value Date   NA 139 05/06/2022   K 4.5 05/06/2022   CO2 22 05/06/2022   GLUCOSE 74 05/06/2022   BUN 12 05/06/2022   CREATININE 1.07 05/06/2022   BILITOT 0.5 05/06/2022   ALKPHOS 56 05/06/2022   AST 28 05/06/2022   ALT 21 05/06/2022   PROT 7.4 05/06/2022   ALBUMIN 4.8 05/06/2022   CALCIUM 9.6 05/06/2022   ANIONGAP 8 09/03/2021   EGFR 76 05/06/2022   Lab Results  Component Value Date   CHOL 114 11/09/2021   Lab Results  Component Value Date   HDL 32 (L) 11/09/2021   Lab Results  Component Value Date   LDLCALC 64 11/09/2021   Lab Results  Component Value Date   TRIG 92 11/09/2021   Lab Results  Component Value Date   CHOLHDL 3.6 11/09/2021   No results found for: "HGBA1C"     Assessment & Plan:  Fever, unspecified fever cause Assessment & Plan: Check labs.  Check UA.   Orders: -     Comprehensive metabolic panel -     CBC with Differential/Platelet -     TSH -     POCT URINALYSIS DIP (CLINITEK)     No orders of the defined types were placed in this encounter.  Orders Placed This Encounter  Procedures   Comprehensive metabolic panel   CBC with Differential/Platelet   TSH   POCT URINALYSIS DIP (CLINITEK)     Follow-up: Return if symptoms worsen or fail to improve call so we can reevaluate at the time of the fever..  An After Visit Summary was printed and given to the patient.  Rochel Brome, MD [facility] [phone]

## 2022-05-06 NOTE — Progress Notes (Signed)
Care Management & Coordination Services Pharmacy Team  Reason for Encounter: Hypertension  Contacted patient to discuss hypertension disease state. Spoke with patient on 05/06/2022     Current antihypertensive regimen:  Amlodipine 5 mg daily Metoprolol Tart '25mg'$  BID   Patient verbally confirms he is taking the above medications as directed. Yes  How often are you checking your Blood Pressure? daily  he checks his blood pressure in the morning before taking his medication.  Current home BP readings: 03-07 130/70 68, 03-06 132/74 62, 03-05 130/72 64, 03-04 134/78 68  Wrist or arm cuff: arm Caffeine intake: Patient stated tea some days  Salt intake:Limited   OTC medications including pseudoephedrine or NSAIDs? None  What recent interventions/DTPs have been made by any provider to improve Blood Pressure control since last CPP Visit:  -Educated on Benefits of medications for managing symptoms and prolonging life -Recommended to continue current medication  Any recent hospitalizations or ED visits since last visit with CPP? No  What diet changes have been made to improve Blood Pressure Control?  Patient stated he limits his sodium intake and drinks plenty of water daily.    What exercise is being done to improve your Blood Pressure Control?  Patient stated he walks some days and walks on a treadmill daily  Adherence Review: Is the patient currently on ACE/ARB medication? No Does the patient have >5 day gap between last estimated fill dates? No  Star Rating Drugs:  Rosuvastatin 20 mg- Last filled 02-18-2022 90 DS.  Previous 12-07-2021 90 DS.   Chart Updates: Recent office visits:  05-06-2022 Rochel Brome, MD. Visit for fever. Temp 97.6.  Recent consult visits:  04-21-2022 Park Liter, MD (Cardiology).  Order placed for VAS US Carotid.  Hospital visits:  None in previous 6 months  Medications: Outpatient Encounter Medications as of 05/06/2022  Medication Sig    amLODipine (NORVASC) 5 MG tablet TAKE 1 TABLET EVERY DAY   aspirin EC 81 MG tablet Take 81 mg by mouth daily.    dorzolamide-timolol (COSOPT) 22.3-6.8 MG/ML ophthalmic solution Place 1 drop into the right eye 2 (two) times daily.   eszopiclone (LUNESTA) 2 MG TABS tablet TAKE 1 TABLET IMMEDIATELY BEFORE BEDTIME AS NEEDED FOR SLEEP   famotidine (PEPCID) 20 MG tablet Take 1 tablet (20 mg total) by mouth at bedtime.   furosemide (LASIX) 20 MG tablet TAKE 1 TABLET EVERY DAY   Glycerin-Hypromellose-PEG 400 ([facility] DRY EYE RELIEF OP) Place 2 drops into both eyes daily as needed (for dry eyes).   HYDROcodone-acetaminophen (NORCO/VICODIN) 5-325 MG tablet Take 1 tablet by mouth every 6 (six) hours as needed for moderate pain.   latanoprost (XALATAN) 0.005 % ophthalmic solution Place 1 drop into the right eye at bedtime.   levothyroxine (SYNTHROID) 88 MCG tablet TAKE 1 TABLET DAILY BEFORE BREAKFAST.   meloxicam (MOBIC) 7.5 MG tablet TAKE 1 TABLET TWICE DAILY   metoprolol tartrate (LOPRESSOR) 25 MG tablet Take 1 tablet (25 mg total) by mouth 2 (two) times daily.   nitroGLYCERIN (NITROSTAT) 0.4 MG SL tablet Place 1 tablet (0.4 mg total) under the tongue every 5 (five) minutes as needed for chest pain.   omeprazole (PRILOSEC) 40 MG capsule TAKE 1 CAPSULE EVERY DAY   rosuvastatin (CRESTOR) 20 MG tablet TAKE 1 TABLET EVERY DAY   sildenafil (VIAGRA) 100 MG tablet TAKE 1/2 TO 1 TABLET EVERY DAY 30-60 MIN PRIOR TO INTERCOURSE AS NEEDED FOR ERECTILE DYSFUNCTION AS DIRECTED   tamsulosin (FLOMAX) 0.4 MG CAPS capsule  TAKE 2 CAPSULES EVERY DAY   Testosterone Cypionate 200 MG/ML SOLN Inject 100 mg as directed every 14 (fourteen) days.   tiZANidine (ZANAFLEX) 4 MG tablet TAKE 1 TABLET EVERY 6 HOURS AS NEEDED FOR MUSCLE SPASM(S)   Vitamin D, Ergocalciferol, (DRISDOL) 1.25 MG (50000 UNIT) CAPS capsule TAKE 1 CAPSULE EVERY SUNDAY.   Facility-Administered Encounter Medications as of 05/06/2022  Medication   testosterone  cypionate (DEPOTESTOSTERONE CYPIONATE) injection 200 mg    Recent Office Vitals: BP Readings from Last 3 Encounters:  05/06/22 130/78  04/21/22 122/70  02/16/22 136/68   Pulse Readings from Last 3 Encounters:  05/06/22 68  04/21/22 66  02/16/22 68    Wt Readings from Last 3 Encounters:  05/06/22 209 lb (94.8 kg)  04/21/22 213 lb (96.6 kg)  02/16/22 211 lb (95.7 kg)     Kidney Function Lab Results  Component Value Date/Time   CREATININE 1.23 02/16/2022 04:05 PM   CREATININE 1.08 11/09/2021 04:08 PM   GFRNONAA >60 09/03/2021 01:19 PM   GFRAA 74 12/14/2019 10:50 AM       Latest Ref Rng & Units 02/16/2022    4:05 PM 11/09/2021    4:08 PM 10/14/2021   12:00 AM  BMP  Glucose 70 - 99 mg/dL 87  74    BUN 8 - 27 mg/dL '13  11  13      '$ Creatinine 0.76 - 1.27 mg/dL 1.23  1.08  1.1      BUN/Creat Ratio 10 - '24 11  10    '$ Sodium 134 - 144 mmol/L 140  140  138      Potassium 3.5 - 5.2 mmol/L 4.2  4.1  4.0      Chloride 96 - 106 mmol/L 103  104  101      CO2 20 - 29 mmol/L '23  19  29      '$ Calcium 8.6 - 10.2 mg/dL 9.7  9.5  12.0         This result is from an external source.     [facility] Pharmacist Assistant [phone]

## 2022-05-07 LAB — CBC WITH DIFFERENTIAL/PLATELET
Basophils Absolute: 0 10*3/uL (ref 0.0–0.2)
Basos: 1 %
EOS (ABSOLUTE): 0.1 10*3/uL (ref 0.0–0.4)
Eos: 3 %
Hematocrit: 38.7 % (ref 37.5–51.0)
Hemoglobin: 13 g/dL (ref 13.0–17.7)
Immature Grans (Abs): 0 10*3/uL (ref 0.0–0.1)
Immature Granulocytes: 0 %
Lymphocytes Absolute: 1.8 10*3/uL (ref 0.7–3.1)
Lymphs: 46 %
MCH: 27.3 pg (ref 26.6–33.0)
MCHC: 33.6 g/dL (ref 31.5–35.7)
MCV: 81 fL (ref 79–97)
Monocytes Absolute: 0.3 10*3/uL (ref 0.1–0.9)
Monocytes: 8 %
Neutrophils Absolute: 1.6 10*3/uL (ref 1.4–7.0)
Neutrophils: 42 %
Platelets: 152 10*3/uL (ref 150–450)
RBC: 4.76 x10E6/uL (ref 4.14–5.80)
RDW: 17.5 % — ABNORMAL HIGH (ref 11.6–15.4)
WBC: 3.9 10*3/uL (ref 3.4–10.8)

## 2022-05-07 LAB — COMPREHENSIVE METABOLIC PANEL
ALT: 21 IU/L (ref 0–44)
AST: 28 IU/L (ref 0–40)
Albumin/Globulin Ratio: 1.8 (ref 1.2–2.2)
Albumin: 4.8 g/dL (ref 3.9–4.9)
Alkaline Phosphatase: 56 IU/L (ref 44–121)
BUN/Creatinine Ratio: 11 (ref 10–24)
BUN: 12 mg/dL (ref 8–27)
Bilirubin Total: 0.5 mg/dL (ref 0.0–1.2)
CO2: 22 mmol/L (ref 20–29)
Calcium: 9.6 mg/dL (ref 8.6–10.2)
Chloride: 102 mmol/L (ref 96–106)
Creatinine, Ser: 1.07 mg/dL (ref 0.76–1.27)
Globulin, Total: 2.6 g/dL (ref 1.5–4.5)
Glucose: 74 mg/dL (ref 70–99)
Potassium: 4.5 mmol/L (ref 3.5–5.2)
Sodium: 139 mmol/L (ref 134–144)
Total Protein: 7.4 g/dL (ref 6.0–8.5)
eGFR: 76 mL/min/{1.73_m2} (ref >59)

## 2022-05-07 LAB — TSH: TSH: 3.28 u[IU]/mL (ref 0.450–4.500)

## 2022-05-08 ENCOUNTER — Other Ambulatory Visit: Payer: Self-pay | Admitting: Cardiology

## 2022-05-09 ENCOUNTER — Encounter: Payer: Self-pay | Admitting: Family Medicine

## 2022-05-09 DIAGNOSIS — R509 Fever, unspecified: Secondary | ICD-10-CM | POA: Insufficient documentation

## 2022-05-09 NOTE — Assessment & Plan Note (Signed)
Check labs  Check UA 

## 2022-05-10 DIAGNOSIS — R1310 Dysphagia, unspecified: Secondary | ICD-10-CM | POA: Diagnosis not present

## 2022-05-10 DIAGNOSIS — K219 Gastro-esophageal reflux disease without esophagitis: Secondary | ICD-10-CM | POA: Diagnosis not present

## 2022-05-10 DIAGNOSIS — K222 Esophageal obstruction: Secondary | ICD-10-CM | POA: Diagnosis not present

## 2022-05-26 ENCOUNTER — Other Ambulatory Visit: Payer: Self-pay | Admitting: Family Medicine

## 2022-05-27 ENCOUNTER — Other Ambulatory Visit: Payer: Self-pay | Admitting: Family Medicine

## 2022-05-27 DIAGNOSIS — N401 Enlarged prostate with lower urinary tract symptoms: Secondary | ICD-10-CM

## 2022-05-29 ENCOUNTER — Other Ambulatory Visit: Payer: Self-pay | Admitting: Family Medicine

## 2022-06-04 DIAGNOSIS — R1310 Dysphagia, unspecified: Secondary | ICD-10-CM | POA: Diagnosis not present

## 2022-06-04 DIAGNOSIS — K222 Esophageal obstruction: Secondary | ICD-10-CM | POA: Diagnosis not present

## 2022-06-04 DIAGNOSIS — K219 Gastro-esophageal reflux disease without esophagitis: Secondary | ICD-10-CM | POA: Diagnosis not present

## 2022-06-04 DIAGNOSIS — Z8501 Personal history of malignant neoplasm of esophagus: Secondary | ICD-10-CM | POA: Diagnosis not present

## 2022-06-04 DIAGNOSIS — I10 Essential (primary) hypertension: Secondary | ICD-10-CM | POA: Diagnosis not present

## 2022-06-10 ENCOUNTER — Telehealth: Payer: Self-pay

## 2022-06-10 NOTE — Progress Notes (Signed)
Care Management & Coordination Services Pharmacy Team  Reason for Encounter: Hypertension  Contacted patient to discuss hypertension disease state. Spoke with patient on 06/10/2022     Current antihypertensive regimen:  Amlodipine 5 mg daily Metoprolol Tart 25mg  BID   Patient verbally confirms he is taking the above medications as directed. Yes  How often are you checking your Blood Pressure? daily  he checks his blood pressure in the morning before taking his medication.  Current home BP readings: 04-10 133/80, 04-09 128/76, 04-08 131/81, 04-07 129/71, 04-06 124/79 Wrist or arm cuff: arm Caffeine intake: Patient stated tea some days  Salt intake: Limited OTC medications including pseudoephedrine or NSAIDs? None  Any readings above 180/100? No  What recent interventions/DTPs have been made by any provider to improve Blood Pressure control since last CPP Visit: None  Any recent hospitalizations or ED visits since last visit with CPP? No  What diet changes have been made to improve Blood Pressure Control?  Patient stated he limits his sodium intake and drinks plenty of water daily.    What exercise is being done to improve your Blood Pressure Control?  Patient stated he walks some days and walks on a treadmill daily   Adherence Review: Is the patient currently on ACE/ARB medication? No Does the patient have >5 day gap between last estimated fill dates? No  Star Rating Drugs:  Rosuvastatin 20 mg- Last filled 05-03-2022 90 DS.  Previous 02-18-2022 90 DS.   Chart Updates: Recent office visits:  None  Recent consult visits:  None  Hospital visits:  None in previous 6 months  Medications: Outpatient Encounter Medications as of 06/10/2022  Medication Sig   amLODipine (NORVASC) 5 MG tablet TAKE 1 TABLET EVERY DAY   aspirin EC 81 MG tablet Take 81 mg by mouth daily.    dorzolamide-timolol (COSOPT) 22.3-6.8 MG/ML ophthalmic solution Place 1 drop into the right eye 2 (two)  times daily.   eszopiclone (LUNESTA) 2 MG TABS tablet TAKE 1 TABLET IMMEDIATELY BEFORE BEDTIME AS NEEDED FOR SLEEP   famotidine (PEPCID) 20 MG tablet Take 1 tablet (20 mg total) by mouth at bedtime.   furosemide (LASIX) 20 MG tablet TAKE 1 TABLET EVERY DAY   Glycerin-Hypromellose-PEG 400 ([facility] DRY EYE RELIEF OP) Place 2 drops into both eyes daily as needed (for dry eyes).   HYDROcodone-acetaminophen (NORCO/VICODIN) 5-325 MG tablet Take 1 tablet by mouth every 6 (six) hours as needed for moderate pain.   latanoprost (XALATAN) 0.005 % ophthalmic solution Place 1 drop into the right eye at bedtime.   levothyroxine (SYNTHROID) 88 MCG tablet TAKE 1 TABLET DAILY BEFORE BREAKFAST.   meloxicam (MOBIC) 7.5 MG tablet TAKE 1 TABLET TWICE DAILY   metoprolol tartrate (LOPRESSOR) 25 MG tablet Take 1 tablet (25 mg total) by mouth 2 (two) times daily.   nitroGLYCERIN (NITROSTAT) 0.4 MG SL tablet Place 1 tablet (0.4 mg total) under the tongue every 5 (five) minutes as needed for chest pain.   omeprazole (PRILOSEC) 40 MG capsule Take 1 capsule (40 mg total) by mouth daily.   rosuvastatin (CRESTOR) 20 MG tablet TAKE 1 TABLET EVERY DAY   sildenafil (VIAGRA) 100 MG tablet TAKE 1/2 TO 1 TABLET EVERY DAY 30-60 MIN PRIOR TO INTERCOURSE AS NEEDED FOR ERECTILE DYSFUNCTION AS DIRECTED   tamsulosin (FLOMAX) 0.4 MG CAPS capsule TAKE 2 CAPSULES EVERY DAY   Testosterone Cypionate 200 MG/ML SOLN Inject 100 mg as directed every 14 (fourteen) days.   tiZANidine (ZANAFLEX) 4 MG tablet TAKE  1 TABLET EVERY 6 HOURS AS NEEDED FOR MUSCLE SPASM(S)   Vitamin D, Ergocalciferol, (DRISDOL) 1.25 MG (50000 UNIT) CAPS capsule TAKE 1 CAPSULE EVERY SUNDAY.   Facility-Administered Encounter Medications as of 06/10/2022  Medication   testosterone cypionate (DEPOTESTOSTERONE CYPIONATE) injection 200 mg    Recent Office Vitals: BP Readings from Last 3 Encounters:  05/06/22 130/78  04/21/22 122/70  02/16/22 136/68   Pulse Readings from Last 3  Encounters:  05/06/22 68  04/21/22 66  02/16/22 68    Wt Readings from Last 3 Encounters:  05/06/22 209 lb (94.8 kg)  04/21/22 213 lb (96.6 kg)  02/16/22 211 lb (95.7 kg)     Kidney Function Lab Results  Component Value Date/Time   CREATININE 1.07 05/06/2022 12:13 PM   CREATININE 1.23 02/16/2022 04:05 PM   GFRNONAA >60 09/03/2021 01:19 PM   GFRAA 74 12/14/2019 10:50 AM       Latest Ref Rng & Units 05/06/2022   12:13 PM 02/16/2022    4:05 PM 11/09/2021    4:08 PM  BMP  Glucose 70 - 99 mg/dL 74  87  74   BUN 8 - 27 mg/dL 12  13  11    Creatinine 0.76 - 1.27 mg/dL 1.51  7.61  6.07   BUN/Creat Ratio 10 - 24 11  11  10    Sodium 134 - 144 mmol/L 139  140  140   Potassium 3.5 - 5.2 mmol/L 4.5  4.2  4.1   Chloride 96 - 106 mmol/L 102  103  104   CO2 20 - 29 mmol/L 22  23  19    Calcium 8.6 - 10.2 mg/dL 9.6  9.7  9.5      [place] [facility] CMA Programme researcher, broadcasting/film/video [phone]

## 2022-06-17 ENCOUNTER — Other Ambulatory Visit: Payer: Self-pay

## 2022-06-17 ENCOUNTER — Other Ambulatory Visit: Payer: Self-pay | Admitting: Family Medicine

## 2022-06-17 DIAGNOSIS — N401 Enlarged prostate with lower urinary tract symptoms: Secondary | ICD-10-CM

## 2022-06-17 DIAGNOSIS — E034 Atrophy of thyroid (acquired): Secondary | ICD-10-CM

## 2022-06-17 DIAGNOSIS — E782 Mixed hyperlipidemia: Secondary | ICD-10-CM

## 2022-06-17 DIAGNOSIS — I110 Hypertensive heart disease with heart failure: Secondary | ICD-10-CM

## 2022-06-17 DIAGNOSIS — E291 Testicular hypofunction: Secondary | ICD-10-CM

## 2022-06-18 ENCOUNTER — Ambulatory Visit: Payer: Medicare PPO | Admitting: Family Medicine

## 2022-06-18 ENCOUNTER — Encounter: Payer: Self-pay | Admitting: Family Medicine

## 2022-06-18 ENCOUNTER — Ambulatory Visit (INDEPENDENT_AMBULATORY_CARE_PROVIDER_SITE_OTHER): Payer: Medicare PPO | Admitting: Family Medicine

## 2022-06-18 ENCOUNTER — Other Ambulatory Visit: Payer: Medicare PPO

## 2022-06-18 VITALS — BP 128/64 | HR 62 | Temp 97.2°F | Ht 71.0 in | Wt 210.0 lb

## 2022-06-18 DIAGNOSIS — I5030 Unspecified diastolic (congestive) heart failure: Secondary | ICD-10-CM | POA: Diagnosis not present

## 2022-06-18 DIAGNOSIS — E034 Atrophy of thyroid (acquired): Secondary | ICD-10-CM

## 2022-06-18 DIAGNOSIS — E782 Mixed hyperlipidemia: Secondary | ICD-10-CM | POA: Diagnosis not present

## 2022-06-18 DIAGNOSIS — E291 Testicular hypofunction: Secondary | ICD-10-CM

## 2022-06-18 DIAGNOSIS — I10 Essential (primary) hypertension: Secondary | ICD-10-CM | POA: Insufficient documentation

## 2022-06-18 DIAGNOSIS — I700 Atherosclerosis of aorta: Secondary | ICD-10-CM

## 2022-06-18 DIAGNOSIS — I110 Hypertensive heart disease with heart failure: Secondary | ICD-10-CM

## 2022-06-18 DIAGNOSIS — I2510 Atherosclerotic heart disease of native coronary artery without angina pectoris: Secondary | ICD-10-CM | POA: Diagnosis not present

## 2022-06-18 DIAGNOSIS — F5101 Primary insomnia: Secondary | ICD-10-CM | POA: Diagnosis not present

## 2022-06-18 DIAGNOSIS — N401 Enlarged prostate with lower urinary tract symptoms: Secondary | ICD-10-CM | POA: Diagnosis not present

## 2022-06-18 DIAGNOSIS — R351 Nocturia: Secondary | ICD-10-CM | POA: Diagnosis not present

## 2022-06-18 NOTE — Assessment & Plan Note (Signed)
Well controlled.  No medication changes recommended. Continue healthy diet and exercise.  Labs drawn 

## 2022-06-18 NOTE — Assessment & Plan Note (Signed)
Well controlled.  No changes to medicines. Continue Rosuvastatin 20 mg daily. Continue to work on eating a healthy diet and exercise.  Labs drawn today.

## 2022-06-18 NOTE — Progress Notes (Signed)
Subjective:  Patient ID: Mark Mejia, male    DOB: 09/25/1955  Age: 67 y.o. MRN: [id number]  Chief Complaint  Patient presents with   Hyperlipidemia   Hypertension   Gastroesophageal Reflux    HPI Hypertension: Checking bp daily. 120-137/70-80s. ON aspirin 81 mg daily, Metoprolol 25 mg twice a day, and amlodipine 5 mg daily, Lasix 20 mg daily   Hypothyroidism: Takes Levothyroxine 88 mcg daily.    Hyperlipidemia: Rosuvastatin 20 mg daily.   GERD: Omeprazole 40 mg daily in the afternoon, Pepcid 40 qhs   Hypogonadism male: Testosterone cypionate 200 mg every 14 days--Stopped but wants to discuss restarting   BPH: Tamsulosin 0.4 mg take 2 tablets twice a day.   Insomnia: Lunesta 2 mg before bed.      05/06/2022   11:25 AM 02/16/2022    3:11 PM 11/20/2021    9:42 AM 02/17/2021   11:15 AM 01/02/2021    9:04 AM  Depression screen PHQ 2/9  Decreased Interest 0 0 0 0 0  Down, Depressed, Hopeless 0 0 0 0 0  PHQ - 2 Score 0 0 0 0 0         02/17/2021   11:14 AM 09/03/2021   12:56 PM 11/20/2021    9:41 AM 02/16/2022    3:11 PM 05/06/2022   11:25 AM  Fall Risk  Falls in the past year? 0  0 0 0  Was there an injury with Fall? 0  0 0 0  Fall Risk Category Calculator 0  0 0 0  Fall Risk Category (Retired) Low  Low Low   (RETIRED) Patient Fall Risk Level  Low fall risk Low fall risk Low fall risk   Patient at Risk for Falls Due to   No Fall Risks No Fall Risks No Fall Risks  Fall risk Follow up   Falls evaluation completed;Falls prevention discussed Falls evaluation completed Falls evaluation completed      Review of Systems  Constitutional:  Negative for chills, diaphoresis, fatigue and fever.  HENT:  Negative for congestion, ear pain and sore throat.   Respiratory:  Negative for cough and shortness of breath.   Cardiovascular:  Negative for chest pain and leg swelling.  Gastrointestinal:  Negative for abdominal pain, constipation, diarrhea, nausea and vomiting.   Genitourinary:  Negative for dysuria and urgency.  Musculoskeletal:  Negative for arthralgias and myalgias.  Neurological:  Negative for dizziness and headaches.  Psychiatric/Behavioral:  Negative for dysphoric mood.     Current Outpatient Medications on File Prior to Visit  Medication Sig Dispense Refill   famotidine (PEPCID) 40 MG tablet Take 40 mg by mouth daily.     amLODipine (NORVASC) 5 MG tablet TAKE 1 TABLET EVERY DAY 90 tablet 3   aspirin EC 81 MG tablet Take 81 mg by mouth daily.      dorzolamide-timolol (COSOPT) 22.3-6.8 MG/ML ophthalmic solution Place 1 drop into the right eye 2 (two) times daily.     eszopiclone (LUNESTA) 2 MG TABS tablet TAKE 1 TABLET IMMEDIATELY BEFORE BEDTIME AS NEEDED FOR SLEEP 30 tablet 5   furosemide (LASIX) 20 MG tablet TAKE 1 TABLET EVERY DAY 90 tablet 3   Glycerin-Hypromellose-PEG 400 ([facility] DRY EYE RELIEF OP) Place 2 drops into both eyes daily as needed (for dry eyes).     HYDROcodone-acetaminophen (NORCO/VICODIN) 5-325 MG tablet Take 1 tablet by mouth every 6 (six) hours as needed for moderate pain. 30 tablet 0   latanoprost (XALATAN) 0.005 % ophthalmic  solution Place 1 drop into the right eye at bedtime.     levothyroxine (SYNTHROID) 88 MCG tablet TAKE 1 TABLET DAILY BEFORE BREAKFAST. 90 tablet 1   meloxicam (MOBIC) 7.5 MG tablet TAKE 1 TABLET TWICE DAILY 180 tablet 1   metoprolol tartrate (LOPRESSOR) 25 MG tablet Take 1 tablet (25 mg total) by mouth 2 (two) times daily. 180 tablet 2   nitroGLYCERIN (NITROSTAT) 0.4 MG SL tablet Place 1 tablet (0.4 mg total) under the tongue every 5 (five) minutes as needed for chest pain. 25 tablet 1   omeprazole (PRILOSEC) 40 MG capsule Take 1 capsule (40 mg total) by mouth daily. 90 capsule 2   rosuvastatin (CRESTOR) 20 MG tablet TAKE 1 TABLET EVERY DAY 90 tablet 3   sildenafil (VIAGRA) 100 MG tablet TAKE 1/2 TO 1 TABLET EVERY DAY 30-60 MIN PRIOR TO INTERCOURSE AS NEEDED FOR ERECTILE DYSFUNCTION AS DIRECTED 10 tablet  2   tamsulosin (FLOMAX) 0.4 MG CAPS capsule TAKE 2 CAPSULES EVERY DAY 180 capsule 1   Testosterone Cypionate 200 MG/ML SOLN Inject 100 mg as directed every 14 (fourteen) days. (Patient not taking: Reported on 06/18/2022) 5 mL 2   tiZANidine (ZANAFLEX) 4 MG tablet TAKE 1 TABLET EVERY 6 HOURS AS NEEDED FOR MUSCLE SPASM(S) 90 tablet 3   Vitamin D, Ergocalciferol, (DRISDOL) 1.25 MG (50000 UNIT) CAPS capsule TAKE 1 CAPSULE EVERY SUNDAY. 12 capsule 3   Current Facility-Administered Medications on File Prior to Visit  Medication Dose Route Frequency Provider Last Rate Last Admin   testosterone cypionate (DEPOTESTOSTERONE CYPIONATE) injection 200 mg  200 mg Intramuscular Q14 Days Jazmyn Offner, MD   200 mg at 12/30/21 1605   Past Medical History:  Diagnosis Date   Angina pectoris 05/25/2017   Atrophy of thyroid    Benign prostatic hyperplasia with nocturia 06/14/2019   CAD (coronary artery disease)    Cancer of cervical esophagus 12/06/2012   Chest pain of uncertain etiology 05/25/2017   Encounter for adjustment and management of vascular access device 03/10/2016   Encounter for annual wellness exam in Medicare patient 03/10/2016   GERD (gastroesophageal reflux disease)    Hepatitis C    History of esophageal cancer    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 06/14/2019   Hypertrophic scar of skin 09/29/2016   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   Mixed hyperlipidemia    Peripheral vascular disease, asymptomatic 05/23/2017   80-99% stenosis in the left internal coronary artery   Precordial chest pain 12/26/2015   Shortness of breath 12/26/2015   Testicular hypofunction    Vitamin D deficiency    Past Surgical History:  Procedure Laterality Date   GASTROSTOMY W/ FEEDING TUBE     LEFT HEART CATH AND CORONARY ANGIOGRAPHY N/A 05/25/2017   Procedure: LEFT HEART CATH AND CORONARY ANGIOGRAPHY;  Surgeon: [place], Peter M, MD;  Location: [facility]  INVASIVE CV LAB;  Service: Cardiovascular;  Laterality: N/A;   PORTACATH PLACEMENT     THROAT SURGERY      Family History  Problem Relation Age of Onset   Arthritis Mother    Hypertension Mother    Cancer - Other Maternal Grandmother    Social History   Socioeconomic History   Marital status: Married    Spouse name: Mervyn Gay   Number of children: 1   Years of education: college   Highest education level: Not on file  Occupational History   Occupation: Retired  Tobacco Use  Smoking status: Never   Smokeless tobacco: Never  Vaping Use   Vaping Use: Never used  Substance and Sexual Activity   Alcohol use: No   Drug use: No   Sexual activity: Not on file  Other Topics Concern   Not on file  Social History Narrative   Currently retired/disabled   Social Determinants of Health   Financial Resource Strain: Low Risk  (04/07/2022)   Overall Financial Resource Strain (CARDIA)    Difficulty of Paying Living Expenses: Not hard at all  Food Insecurity: No Food Insecurity (02/17/2021)   Hunger Vital Sign    Worried About Running Out of Food in the Last Year: Never true    Ran Out of Food in the Last Year: Never true  Transportation Needs: No Transportation Needs (04/07/2022)   PRAPARE - Administrator, Civil Service (Medical): No    Lack of Transportation (Non-Medical): No  Physical Activity: Insufficiently Active (06/18/2022)   Exercise Vital Sign    Days of Exercise per Week: 6 days    Minutes of Exercise per Session: 10 min  Stress: No Stress Concern Present (06/18/2022)   [employer] of Occupational Health - Occupational Stress Questionnaire    Feeling of Stress : Not at all  Social Connections: Moderately Integrated (06/18/2022)   Social Connection and Isolation Panel [NHANES]    Frequency of Communication with Friends and Family: Three times a week    Frequency of Social Gatherings with Friends and Family: Three times a week    Attends Religious Services: More  than 4 times per year    Active Member of Clubs or Organizations: No    Attends Banker Meetings: Never    Marital Status: Married    Objective:  BP 128/64   Pulse 62   Temp (!) 97.2 F (36.2 C)   Ht  (1.803 m)   Wt 210 lb (95.3 kg)   SpO2 99%   BMI 29.29 kg/m      06/18/2022    9:03 AM 05/06/2022   11:24 AM 04/21/2022    3:44 PM  BP/Weight  Systolic BP 128 130 122  Diastolic BP 64 78 70  Wt. (Lbs) 210 209 213  BMI 29.29 kg/m2 29.99 kg/m2 30.56 kg/m2    Physical Exam Vitals reviewed.  Constitutional:      Appearance: Normal appearance. He is obese.  Cardiovascular:     Rate and Rhythm: Normal rate and regular rhythm.     Heart sounds: No murmur heard. Pulmonary:     Effort: Pulmonary effort is normal.     Breath sounds: Normal breath sounds.  Abdominal:     General: Abdomen is flat. Bowel sounds are normal.     Palpations: Abdomen is soft.     Tenderness: There is no abdominal tenderness.  Neurological:     Mental Status: He is alert and oriented to person, place, and time.  Psychiatric:        Mood and Affect: Mood normal.        Behavior: Behavior normal.     Diabetic Foot Exam - Simple   No data filed      Lab Results  Component Value Date   WBC 3.9 05/06/2022   HGB 13.0 05/06/2022   HCT 38.7 05/06/2022   PLT 152 05/06/2022   GLUCOSE 74 05/06/2022   CHOL 114 11/09/2021   TRIG 92 11/09/2021   HDL 32 (L) 11/09/2021   LDLCALC 64 11/09/2021   ALT  21 05/06/2022   AST 28 05/06/2022   NA 139 05/06/2022   K 4.5 05/06/2022   CL 102 05/06/2022   CREATININE 1.07 05/06/2022   BUN 12 05/06/2022   CO2 22 05/06/2022   TSH 3.280 05/06/2022   INR 1.1 05/23/2017      Assessment & Plan:    Mixed hyperlipidemia Assessment & Plan: Well controlled.  No changes to medicines. Continue Rosuvastatin 20 mg daily. Continue to work on eating a healthy diet and exercise.  Labs drawn today.     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  No changes to medicines. Continue on aspirin 81 mg daily, Metoprolol 25 mg twice a day, and amlodipine 5 mg daily, Lasix 20 mg daily Continue to work on eating a healthy diet and exercise.  Labs drawn today.    Orders: -     Comprehensive metabolic panel -     CBC with Differential/Platelet  Coronary artery disease involving native coronary artery of native heart without angina pectoris Assessment & Plan: Continue rosuvastatin 20 mg daily and aspirin 81 mg qd.  Recommend continue to work on eating healthy diet and exercise.    Primary insomnia Assessment & Plan: Continue lunesta 2 mg before bed.    Atrophy of thyroid Assessment & Plan: Previously well controlled Continue Synthroid at current dose  Recheck TSH and adjust Synthroid as indicated    Orders: -     TSH  Male hypogonadism Assessment & Plan: Change to Testosterone 100 mg weekly   Aortic atherosclerosis Assessment & Plan: The current medical regimen is effective;  continue present plan and medications. Continue crestor 20 mg daily and aspirin 81 mg daily.     Follow-up: Return in about 4 months (around 10/18/2022) for chronic, fasting.   I,Katherina A Bramblett,acting as a scribe for [name], MD.,have documented all relevant documentation on the behalf of [name], MD,as directed by  [name], MD while in the presence of [name], MD.   An After Visit Summary was printed and given to the patient.  I attest that I have reviewed this visit and agree with the plan scribed by my staff.   [name], MD [facility] [phone]

## 2022-06-18 NOTE — Patient Instructions (Signed)
Discuss with pharmacist about shingrix and Tdap vaccines.

## 2022-06-18 NOTE — Assessment & Plan Note (Signed)
Testosterone 100 mg weekly

## 2022-06-20 NOTE — Assessment & Plan Note (Addendum)
>>  ASSESSMENT AND PLAN FOR HYPERTENSIVE HEART DISEASE WITH DIASTOLIC HEART FAILURE (HCC) WRITTEN ON 06/20/2022 11:40 PM BY Tryphena Perkovich, MD  Well controlled.  No changes to medicines. Continue on aspirin 81 mg daily, Metoprolol 25 mg twice a day, and amlodipine 5 mg daily, Lasix 20 mg daily Continue to work on eating a healthy diet and exercise.  Labs drawn today.    >>ASSESSMENT AND PLAN FOR PRIMARY HYPERTENSION WRITTEN ON 06/18/2022  9:17 AM BY BRAMBLETT, KATHERINA A, CMA  Well controlled.  No medication changes recommended. Continue healthy diet and exercise.   Labs drawn

## 2022-06-20 NOTE — Assessment & Plan Note (Signed)
The current medical regimen is effective;  continue present plan and medications. Continue crestor 20 mg daily and aspirin 81 mg daily.

## 2022-06-20 NOTE — Assessment & Plan Note (Signed)
>>  ASSESSMENT AND PLAN FOR AORTIC ATHEROSCLEROSIS (HCC) WRITTEN ON 06/20/2022 11:41 PM BY COX, KIRSTEN, MD  The current medical regimen is effective;  continue present plan and medications. Continue crestor 20 mg daily and aspirin 81 mg daily.

## 2022-06-20 NOTE — Assessment & Plan Note (Signed)
Previously well controlled Continue Synthroid at current dose  Recheck TSH and adjust Synthroid as indicated   

## 2022-06-20 NOTE — Assessment & Plan Note (Signed)
Continue rosuvastatin 20 mg daily and aspirin 81 mg qd.  Recommend continue to work on eating healthy diet and exercise.

## 2022-06-20 NOTE — Assessment & Plan Note (Signed)
Continue lunesta 2 mg before bed.

## 2022-06-23 ENCOUNTER — Ambulatory Visit: Payer: Medicare PPO | Admitting: Family Medicine

## 2022-06-24 ENCOUNTER — Other Ambulatory Visit: Payer: Self-pay | Admitting: Family Medicine

## 2022-06-26 LAB — CBC WITH DIFFERENTIAL/PLATELET
Basophils Absolute: 0 10*3/uL (ref 0.0–0.2)
Basos: 1 %
EOS (ABSOLUTE): 0.2 10*3/uL (ref 0.0–0.4)
Eos: 4 %
Hematocrit: 37.4 % — ABNORMAL LOW (ref 37.5–51.0)
Hemoglobin: 11.9 g/dL — ABNORMAL LOW (ref 13.0–17.7)
Immature Grans (Abs): 0 10*3/uL (ref 0.0–0.1)
Immature Granulocytes: 0 %
Lymphocytes Absolute: 1.7 10*3/uL (ref 0.7–3.1)
Lymphs: 50 %
MCH: 27.3 pg (ref 26.6–33.0)
MCHC: 31.8 g/dL (ref 31.5–35.7)
MCV: 86 fL (ref 79–97)
Monocytes Absolute: 0.3 10*3/uL (ref 0.1–0.9)
Monocytes: 8 %
Neutrophils Absolute: 1.3 10*3/uL — ABNORMAL LOW (ref 1.4–7.0)
Neutrophils: 37 %
Platelets: 138 10*3/uL — ABNORMAL LOW (ref 150–450)
RBC: 4.36 x10E6/uL (ref 4.14–5.80)
RDW: 14.2 % (ref 11.6–15.4)
WBC: 3.5 10*3/uL (ref 3.4–10.8)

## 2022-06-26 LAB — COMPREHENSIVE METABOLIC PANEL
ALT: 23 IU/L (ref 0–44)
AST: 29 IU/L (ref 0–40)
Albumin/Globulin Ratio: 1.6 (ref 1.2–2.2)
Albumin: 4.6 g/dL (ref 3.9–4.9)
Alkaline Phosphatase: 55 IU/L (ref 44–121)
BUN/Creatinine Ratio: 8 — ABNORMAL LOW (ref 10–24)
BUN: 8 mg/dL (ref 8–27)
Bilirubin Total: 0.6 mg/dL (ref 0.0–1.2)
CO2: 24 mmol/L (ref 20–29)
Calcium: 9.4 mg/dL (ref 8.6–10.2)
Chloride: 103 mmol/L (ref 96–106)
Creatinine, Ser: 0.97 mg/dL (ref 0.76–1.27)
Globulin, Total: 2.9 g/dL (ref 1.5–4.5)
Glucose: 86 mg/dL (ref 70–99)
Potassium: 4.4 mmol/L (ref 3.5–5.2)
Sodium: 141 mmol/L (ref 134–144)
Total Protein: 7.5 g/dL (ref 6.0–8.5)
eGFR: 85 mL/min/{1.73_m2} (ref >59)

## 2022-06-26 LAB — LIPID PANEL
Chol/HDL Ratio: 3.4 ratio (ref 0.0–5.0)
Cholesterol, Total: 104 mg/dL (ref 100–199)
HDL: 31 mg/dL — ABNORMAL LOW (ref >39)
LDL Chol Calc (NIH): 55 mg/dL (ref 0–99)
Triglycerides: 92 mg/dL (ref 0–149)
VLDL Cholesterol Cal: 18 mg/dL (ref 5–40)

## 2022-06-26 LAB — TESTOSTERONE,FREE AND TOTAL
Testosterone, Free: 2 pg/mL — ABNORMAL LOW (ref 6.6–18.1)
Testosterone: 258 ng/dL — ABNORMAL LOW (ref 264–916)

## 2022-06-26 LAB — PSA: Prostate Specific Ag, Serum: 0.5 ng/mL (ref 0.0–4.0)

## 2022-06-26 LAB — CARDIOVASCULAR RISK ASSESSMENT

## 2022-06-26 LAB — TSH: TSH: 3.87 u[IU]/mL (ref 0.450–4.500)

## 2022-06-29 DIAGNOSIS — S0502XA Injury of conjunctiva and corneal abrasion without foreign body, left eye, initial encounter: Secondary | ICD-10-CM | POA: Diagnosis not present

## 2022-06-30 ENCOUNTER — Ambulatory Visit (INDEPENDENT_AMBULATORY_CARE_PROVIDER_SITE_OTHER): Payer: Medicare PPO

## 2022-06-30 DIAGNOSIS — E291 Testicular hypofunction: Secondary | ICD-10-CM | POA: Diagnosis not present

## 2022-06-30 MED ORDER — TESTOSTERONE CYPIONATE 200 MG/ML IM SOLN
200.0000 mg | Freq: Once | INTRAMUSCULAR | Status: AC
Start: 2022-06-30 — End: 2022-06-30
  Administered 2022-06-30: 200 mg via INTRAMUSCULAR

## 2022-07-01 NOTE — Progress Notes (Signed)
Patient tolerated well.

## 2022-07-05 ENCOUNTER — Other Ambulatory Visit: Payer: Self-pay | Admitting: Family Medicine

## 2022-07-14 ENCOUNTER — Ambulatory Visit (INDEPENDENT_AMBULATORY_CARE_PROVIDER_SITE_OTHER): Payer: Medicare PPO

## 2022-07-14 DIAGNOSIS — R059 Cough, unspecified: Secondary | ICD-10-CM | POA: Diagnosis not present

## 2022-07-14 DIAGNOSIS — D649 Anemia, unspecified: Secondary | ICD-10-CM | POA: Diagnosis not present

## 2022-07-14 DIAGNOSIS — J40 Bronchitis, not specified as acute or chronic: Secondary | ICD-10-CM | POA: Diagnosis not present

## 2022-07-14 DIAGNOSIS — E291 Testicular hypofunction: Secondary | ICD-10-CM | POA: Diagnosis not present

## 2022-07-14 DIAGNOSIS — R0981 Nasal congestion: Secondary | ICD-10-CM | POA: Diagnosis not present

## 2022-07-14 LAB — POC HEMOCCULT BLD/STL (HOME/3-CARD/SCREEN)
Card #2 Fecal Occult Blod, POC: NEGATIVE
Card #3 Fecal Occult Blood, POC: NEGATIVE
Fecal Occult Blood, POC: NEGATIVE

## 2022-07-14 NOTE — Progress Notes (Signed)
Patient came in for Testerone injection, given in right gluteus Medix. Tolerated well.  Also had hemoccult cards and they all were negative, results sent to provider.

## 2022-07-16 ENCOUNTER — Telehealth: Payer: Self-pay

## 2022-07-16 NOTE — Progress Notes (Signed)
Care Management & Coordination Services Pharmacy Team  Reason for Encounter: Hypertension  Contacted patient to discuss hypertension disease state. Spoke with patient on 07/16/2022     Current antihypertensive regimen:  Amlodipine 5 mg daily Metoprolol Tart 25mg  BID   Patient verbally confirms he is taking the above medications as directed. Yes  How often are you checking your Blood Pressure? daily  he checks his blood pressure in the morning before taking his medication.  Current home BP readings: 05-17 131/78, 05-16 122/81, 05-15 130/78, 05-14 130/77, 05-13 133/82 Wrist or arm cuff: Cuff Caffeine intake: Patient stated tea some days  Salt intake: Limited OTC medications including pseudoephedrine or NSAIDs? None  Any readings above 180/100? No  What recent interventions/DTPs have been made by any provider to improve Blood Pressure control since last CPP Visit: No  Any recent hospitalizations or ED visits since last visit with CPP? No  What diet changes have been made to improve Blood Pressure Control?  Patient stated he limits his sodium intake and drinks plenty of water daily.    What exercise is being done to improve your Blood Pressure Control?  Patient stated he walks some days and walks on a treadmill daily    Adherence Review: Is the patient currently on ACE/ARB medication? No Does the patient have >5 day gap between last estimated fill dates? No  Star Rating Drugs: Rosuvastatin 20 mg- Last filled 05-03-2022 90 DS.  Previous 02-18-2022 90 DS.   Chart Updates: Recent office visits:  07-14-2022 Jomarie Longs, CMA. Visit for testosterone injection.  06-30-2022 Jomarie Longs, CMA. Visit for testosterone injection.  06-18-2022 [name], MD. Follow up visit for Hyperlipidemia, Hypertension, Gastroesophageal Reflux. Increase famotidine 20 mg at bedtime to 40 mg.   Recent consult visits:  None  Hospital visits:  None in previous 6  months  Medications: Outpatient Encounter Medications as of 07/16/2022  Medication Sig   amLODipine (NORVASC) 5 MG tablet TAKE 1 TABLET EVERY DAY   aspirin EC 81 MG tablet Take 81 mg by mouth daily.    dorzolamide-timolol (COSOPT) 22.3-6.8 MG/ML ophthalmic solution Place 1 drop into the right eye 2 (two) times daily.   eszopiclone (LUNESTA) 2 MG TABS tablet TAKE 1 TABLET IMMEDIATELY BEFORE BEDTIME AS NEEDED FOR SLEEP   famotidine (PEPCID) 40 MG tablet Take 40 mg by mouth daily.   furosemide (LASIX) 20 MG tablet TAKE 1 TABLET EVERY DAY   Glycerin-Hypromellose-PEG 400 ([facility] DRY EYE RELIEF OP) Place 2 drops into both eyes daily as needed (for dry eyes).   HYDROcodone-acetaminophen (NORCO/VICODIN) 5-325 MG tablet Take 1 tablet by mouth every 6 (six) hours as needed for moderate pain.   latanoprost (XALATAN) 0.005 % ophthalmic solution Place 1 drop into the right eye at bedtime.   levothyroxine (SYNTHROID) 88 MCG tablet TAKE 1 TABLET DAILY BEFORE BREAKFAST.   meloxicam (MOBIC) 7.5 MG tablet TAKE 1 TABLET TWICE DAILY   metoprolol tartrate (LOPRESSOR) 25 MG tablet Take 1 tablet (25 mg total) by mouth 2 (two) times daily.   nitroGLYCERIN (NITROSTAT) 0.4 MG SL tablet Place 1 tablet (0.4 mg total) under the tongue every 5 (five) minutes as needed for chest pain.   omeprazole (PRILOSEC) 40 MG capsule Take 1 capsule (40 mg total) by mouth daily.   rosuvastatin (CRESTOR) 20 MG tablet TAKE 1 TABLET EVERY DAY   sildenafil (VIAGRA) 100 MG tablet TAKE 1/2 TO 1 TABLET EVERY DAY 30-60 MIN PRIOR TO INTERCOURSE AS NEEDED FOR ERECTILE DYSFUNCTION AS DIRECTED  tamsulosin (FLOMAX) 0.4 MG CAPS capsule TAKE 2 CAPSULES EVERY DAY   testosterone cypionate (DEPOTESTOSTERONE CYPIONATE) 200 MG/ML injection INJECT 200 MG ( ) AS DIRECTED EVERY 14 DAYS. VIAL IS FOR SINGLE USE ONLY   Testosterone Cypionate 200 MG/ML SOLN Inject 100 mg as directed every 14 (fourteen) days. (Patient not taking: Reported on 06/18/2022)    tiZANidine (ZANAFLEX) 4 MG tablet TAKE 1 TABLET EVERY 6 HOURS AS NEEDED FOR MUSCLE SPASM(S)   Vitamin D, Ergocalciferol, (DRISDOL) 1.25 MG (50000 UNIT) CAPS capsule TAKE 1 CAPSULE EVERY SUNDAY.   Facility-Administered Encounter Medications as of 07/16/2022  Medication   testosterone cypionate (DEPOTESTOSTERONE CYPIONATE) injection 200 mg    Recent Office Vitals: BP Readings from Last 3 Encounters:  06/18/22 128/64  05/06/22 130/78  04/21/22 122/70   Pulse Readings from Last 3 Encounters:  06/18/22 62  05/06/22 68  04/21/22 66    Wt Readings from Last 3 Encounters:  06/18/22 210 lb (95.3 kg)  05/06/22 209 lb (94.8 kg)  04/21/22 213 lb (96.6 kg)     Kidney Function Lab Results  Component Value Date/Time   CREATININE 0.97 06/18/2022 09:30 AM   CREATININE 1.07 05/06/2022 12:13 PM   GFRNONAA >60 09/03/2021 01:19 PM   GFRAA 74 12/14/2019 10:50 AM       Latest Ref Rng & Units 06/18/2022    9:30 AM 05/06/2022   12:13 PM 02/16/2022    4:05 PM  BMP  Glucose 70 - 99 mg/dL 86  74  87   BUN 8 - 27 mg/dL 8  12  13    Creatinine 0.76 - 1.27 mg/dL 1.61  0.96  0.45   BUN/Creat Ratio 10 - 24 8  11  11    Sodium 134 - 144 mmol/L 141  139  140   Potassium 3.5 - 5.2 mmol/L 4.4  4.5  4.2   Chloride 96 - 106 mmol/L 103  102  103   CO2 20 - 29 mmol/L 24  22  23    Calcium 8.6 - 10.2 mg/dL 9.4  9.6  9.7      [facility] CMA Programme researcher, broadcasting/film/video [phone]

## 2022-07-28 ENCOUNTER — Ambulatory Visit (INDEPENDENT_AMBULATORY_CARE_PROVIDER_SITE_OTHER): Payer: Medicare PPO

## 2022-07-28 DIAGNOSIS — E291 Testicular hypofunction: Secondary | ICD-10-CM

## 2022-07-28 NOTE — Progress Notes (Signed)
Patient came in today for testosterone injection. Injection giving on Left Upper Outer Quadrant Patient tolerated injection well.

## 2022-08-09 ENCOUNTER — Other Ambulatory Visit: Payer: Self-pay | Admitting: Family Medicine

## 2022-08-09 ENCOUNTER — Other Ambulatory Visit: Payer: Self-pay | Admitting: Cardiology

## 2022-08-09 DIAGNOSIS — F5101 Primary insomnia: Secondary | ICD-10-CM

## 2022-08-10 NOTE — Telephone Encounter (Signed)
Rx sent to pharmacy   

## 2022-08-11 ENCOUNTER — Ambulatory Visit (INDEPENDENT_AMBULATORY_CARE_PROVIDER_SITE_OTHER): Payer: Medicare PPO

## 2022-08-11 DIAGNOSIS — E291 Testicular hypofunction: Secondary | ICD-10-CM

## 2022-08-11 MED ORDER — TESTOSTERONE CYPIONATE 200 MG/ML IM SOLN
200.0000 mg | Freq: Once | INTRAMUSCULAR | Status: AC
Start: 2022-08-11 — End: 2022-08-11
  Administered 2022-08-11: 200 mg via INTRAMUSCULAR

## 2022-08-18 ENCOUNTER — Encounter: Payer: Self-pay | Admitting: Family Medicine

## 2022-08-19 ENCOUNTER — Other Ambulatory Visit: Payer: Self-pay | Admitting: Family Medicine

## 2022-08-19 DIAGNOSIS — B353 Tinea pedis: Secondary | ICD-10-CM

## 2022-08-24 ENCOUNTER — Other Ambulatory Visit: Payer: Self-pay | Admitting: Physician Assistant

## 2022-08-24 DIAGNOSIS — F5101 Primary insomnia: Secondary | ICD-10-CM

## 2022-08-25 ENCOUNTER — Ambulatory Visit (INDEPENDENT_AMBULATORY_CARE_PROVIDER_SITE_OTHER): Payer: Medicare PPO

## 2022-08-25 DIAGNOSIS — E291 Testicular hypofunction: Secondary | ICD-10-CM

## 2022-08-25 MED ORDER — TESTOSTERONE CYPIONATE 200 MG/ML IM SOLN
200.0000 mg | Freq: Once | INTRAMUSCULAR | Status: AC
Start: 2022-08-25 — End: 2022-08-25
  Administered 2022-08-25: 200 mg via INTRAMUSCULAR

## 2022-08-29 MED ORDER — TESTOSTERONE CYPIONATE 200 MG/ML IM SOLN
200.0000 mg | Freq: Once | INTRAMUSCULAR | Status: AC
Start: 2022-08-29 — End: 2022-10-18
  Administered 2022-10-18: 200 mg via INTRAMUSCULAR

## 2022-08-29 NOTE — Addendum Note (Signed)
Addended byBlane Ohara on: 08/29/2022 03:51 PM   Modules accepted: Orders

## 2022-08-30 ENCOUNTER — Ambulatory Visit: Payer: Medicare PPO | Admitting: Podiatry

## 2022-08-30 DIAGNOSIS — B351 Tinea unguium: Secondary | ICD-10-CM | POA: Diagnosis not present

## 2022-08-30 DIAGNOSIS — B353 Tinea pedis: Secondary | ICD-10-CM

## 2022-08-30 MED ORDER — CICLOPIROX 8 % EX SOLN: Freq: Every day | CUTANEOUS | 0 refills | Status: DC

## 2022-08-30 MED ORDER — KETOCONAZOLE 2 % EX CREA: 1.0000 | TOPICAL_CREAM | Freq: Every day | CUTANEOUS | 0 refills | Status: DC

## 2022-08-30 NOTE — Progress Notes (Signed)
Subjective:  Patient ID: Mark Mejia, male    DOB: 02-Jul-1955,  MRN: [id number]  Chief Complaint  Patient presents with   Tinea Pedis    Tinea pedis of both feet. Skin peeling and dryness. Denies any itchiness at this time.   Nail Problem    Nail fungus to bilateral hallux nails. Thickening and discoloration. Has never been treated for nail fungus. Not diabetic.     67 y.o. male presents with concern for red rash and dry flaking skin on the bottom of both feet.  He has tried over-the-counter antifungal sprays but they have not been effective.  He also notes that his bilateral great toenails are thickened yellow discolored and growing abnormally.  He does not have pain with them however.  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  Itching is noted.  Patient does have  yellowing discoloration thickening dystrophy and onycholysis with subungual debris present in the bilateral hallux nail.  No pain on palpation of the nails.  Vascular:  Dorsalis Pedis artery and Posterior Tibial artery pedal pulses are 2/4 bilateral.  Capillary fill time < 3 sec to all digits.   Neruologic: Grossly intact via light touch bilateral. Protective threshold intact to all sites bilateral.   Musculoskeletal: No gross boney pedal deformities bilateral. No pain, crepitus, or limitation noted with foot and ankle range of motion bilateral. Muscular strength 5/5 in all groups tested bilateral.  Gait: Unassisted, Nonantalgic.   No images are attached to the encounter.  Assessment:   1. Tinea pedis of both feet   2. Onychomycosis      Plan:  Patient was evaluated and treated and all questions answered.  # Tinea pedis bilateral foot Discussed the etiology and treatment options for tinea pedis.  Discussed topical and oral treatment.  Recommended topical treatment with 2% ketoconazole cream.  This was sent to the patient's pharmacy.  Also discussed appropriate foot hygiene, use of antifungal spray such as Tinactin in shoes, as well as cleaning her foot surfaces such as showers and bathroom floors with bleach.  # Onychomycosis bilateral hallux nail Onychomycosis -Educated on etiology of nail fungus. -Discussed treatment options with patient including topical and oral antifungal. -Patient wishes to defer oral antifungal due to risk of liver toxicity -eRx for Penlac 8% topical solution applied daily at night to all  affected nails for the next 6 months.   Return in about 3 months (around 11/30/2022) for f/u tinea pedis/ onychomycosis.          Corinna Gab, DPM [facility]

## 2022-08-31 DIAGNOSIS — R490 Dysphonia: Secondary | ICD-10-CM | POA: Diagnosis not present

## 2022-08-31 DIAGNOSIS — R1310 Dysphagia, unspecified: Secondary | ICD-10-CM | POA: Diagnosis not present

## 2022-08-31 DIAGNOSIS — Z923 Personal history of irradiation: Secondary | ICD-10-CM | POA: Diagnosis not present

## 2022-08-31 DIAGNOSIS — Z8501 Personal history of malignant neoplasm of esophagus: Secondary | ICD-10-CM | POA: Diagnosis not present

## 2022-08-31 DIAGNOSIS — K222 Esophageal obstruction: Secondary | ICD-10-CM | POA: Diagnosis not present

## 2022-09-07 ENCOUNTER — Encounter: Payer: Self-pay | Admitting: Family Medicine

## 2022-09-08 ENCOUNTER — Ambulatory Visit: Payer: Medicare PPO

## 2022-09-08 DIAGNOSIS — Z85819 Personal history of malignant neoplasm of unspecified site of lip, oral cavity, and pharynx: Secondary | ICD-10-CM | POA: Diagnosis not present

## 2022-09-08 DIAGNOSIS — M47812 Spondylosis without myelopathy or radiculopathy, cervical region: Secondary | ICD-10-CM | POA: Diagnosis not present

## 2022-09-08 DIAGNOSIS — R590 Localized enlarged lymph nodes: Secondary | ICD-10-CM | POA: Diagnosis not present

## 2022-09-08 DIAGNOSIS — I10 Essential (primary) hypertension: Secondary | ICD-10-CM | POA: Diagnosis not present

## 2022-09-08 LAB — LAB REPORT - SCANNED

## 2022-09-09 ENCOUNTER — Telehealth: Payer: Self-pay

## 2022-09-09 ENCOUNTER — Ambulatory Visit (INDEPENDENT_AMBULATORY_CARE_PROVIDER_SITE_OTHER): Payer: Medicare PPO | Admitting: Family Medicine

## 2022-09-09 VITALS — BP 104/56 | HR 68 | Temp 96.5°F | Resp 18 | Ht 71.0 in | Wt 217.6 lb

## 2022-09-09 DIAGNOSIS — R590 Localized enlarged lymph nodes: Secondary | ICD-10-CM

## 2022-09-09 DIAGNOSIS — I9589 Other hypotension: Secondary | ICD-10-CM

## 2022-09-09 NOTE — Telephone Encounter (Signed)
Patient's wife Vernona Rieger called stating that the patient was seen at [facility] for Low BP and dropping of BP with Shortness of breath. She states he was also having pain in the back of his head. They did a CT of his neck and found enlarged lymph nodes which on the reading of the CT scan it was recommended that the patient have a CT of Chest done. Patient has an appointment scheduled with his Oncologist which is what was recommended and to follow up with you too as well. He is scheduled to see Oncology on 7/19. Please advise scheduling. I have the notes printed on my desk.

## 2022-09-09 NOTE — Telephone Encounter (Signed)
Patient informed and scheduled.

## 2022-09-09 NOTE — Patient Instructions (Addendum)
Hold amlodipine. Decrease metoprolol to 25 mg daily every morning.   Hold Lasix.  Hold Tamsulosin. Follow-up with Dr. Melvyn Neth as scheduled.

## 2022-09-09 NOTE — Progress Notes (Unsigned)
Subjective:  Patient ID: KY RUMPLE, male    DOB: April 26, 1955  Age: 67 y.o. MRN: [id number]  Chief Complaint  Patient presents with   Hospitalization Follow-up    HPI   Mark Mejia comes in for ED follow-up. He was evaluated for hypotension.  He has past hx of cancer of the throat. His blood pressure was systolic 70.  CT of neck showed:  1.No acute findings, visible mass, or adenopathy in the neck. Given  reported history of cancer, direct comparison with any priors would  be helpful if available to assess for change at the site of  treatment.  2. Interval increase in enlarged partially imaged nonspecific  mediastinal and hilar lymph nodes including a 1.7 cm pre-vascular  node (previously 1 cm in 2017). Consider CT of the chest with  contrast for full evaluation.   Discharge to home and follow up with PCP.     05/06/2022   11:25 AM 02/16/2022    3:11 PM 11/20/2021    9:42 AM 02/17/2021   11:15 AM 01/02/2021    9:04 AM  Depression screen PHQ 2/9  Decreased Interest 0 0 0 0 0  Down, Depressed, Hopeless 0 0 0 0 0  PHQ - 2 Score 0 0 0 0 0        05/06/2022   11:25 AM  Fall Risk   Falls in the past year? 0  Number falls in past yr: 0  Injury with Fall? 0  Risk for fall due to : No Fall Risks  Follow up Falls evaluation completed    Patient Care Team: [name], MD as PCP - General (Internal Medicine) Georgeanna Lea, MD as Consulting Physician (Cardiology) Zettie Pho, [facility] (Inactive) as Consulting Physician (Pharmacist)   Review of Systems  Constitutional:  Positive for fatigue. Negative for chills, diaphoresis and fever.       Night sweats  HENT:  Negative for congestion, ear pain and sore throat.   Respiratory:  Positive for cough and shortness of breath.   Cardiovascular:  Negative for chest pain and leg swelling.  Gastrointestinal:  Negative for abdominal pain, constipation, diarrhea, nausea and vomiting.  Genitourinary:  Negative for dysuria and  urgency.  Musculoskeletal:  Negative for arthralgias and myalgias.  Neurological:  Positive for headaches. Negative for dizziness.  Psychiatric/Behavioral:  Negative for dysphoric mood.     Current Outpatient Medications on File Prior to Visit  Medication Sig Dispense Refill   amLODipine (NORVASC) 5 MG tablet TAKE 1 TABLET EVERY DAY 90 tablet 3   aspirin EC 81 MG tablet Take 81 mg by mouth daily.      ciclopirox (PENLAC) 8 % solution Apply topically at bedtime. Apply over nail and surrounding skin. Apply daily over previous coat. After seven (7) days, may remove with alcohol and continue cycle. 6.6 mL 0   dorzolamide-timolol (COSOPT) 22.3-6.8 MG/ML ophthalmic solution Place 1 drop into the right eye 2 (two) times daily.     eszopiclone (LUNESTA) 2 MG TABS tablet TAKE 1 TABLET IMMEDIATELY BEFORE BEDTIME AS NEEDED FOR SLEEP 30 tablet 5   famotidine (PEPCID) 40 MG tablet Take 40 mg by mouth daily.     Glycerin-Hypromellose-PEG 400 ([facility] DRY EYE RELIEF OP) Place 2 drops into both eyes daily as needed (for dry eyes).     HYDROcodone-acetaminophen (NORCO/VICODIN) 5-325 MG tablet Take 1 tablet by mouth every 6 (six) hours as needed for moderate pain. 30 tablet 0   ketoconazole (NIZORAL) 2 % cream  Apply 1 Application topically daily. 60 g 0   latanoprost (XALATAN) 0.005 % ophthalmic solution Place 1 drop into the right eye at bedtime.     levothyroxine (SYNTHROID) 88 MCG tablet TAKE 1 TABLET EVERY DAY BEFORE BREAKFAST 90 tablet 0   meloxicam (MOBIC) 7.5 MG tablet TAKE 1 TABLET TWICE DAILY 180 tablet 0   metoprolol tartrate (LOPRESSOR) 25 MG tablet TAKE 1 TABLET TWICE DAILY 180 tablet 3   nitroGLYCERIN (NITROSTAT) 0.4 MG SL tablet Place 1 tablet (0.4 mg total) under the tongue every 5 (five) minutes as needed for chest pain. 25 tablet 1   omeprazole (PRILOSEC) 40 MG capsule Take 1 capsule (40 mg total) by mouth daily. 90 capsule 2   rosuvastatin (CRESTOR) 20 MG tablet TAKE 1 TABLET EVERY DAY 90 tablet 3    sildenafil (VIAGRA) 100 MG tablet TAKE 1/2 TO 1 TABLET EVERY DAY 30-60 MIN PRIOR TO INTERCOURSE AS NEEDED FOR ERECTILE DYSFUNCTION AS DIRECTED 10 tablet 2   tamsulosin (FLOMAX) 0.4 MG CAPS capsule TAKE 2 CAPSULES EVERY DAY 180 capsule 1   testosterone cypionate (DEPOTESTOSTERONE CYPIONATE) 200 MG/ML injection INJECT 200 MG ( ) AS DIRECTED EVERY 14 DAYS. VIAL IS FOR SINGLE USE ONLY 5 mL 0   tiZANidine (ZANAFLEX) 4 MG tablet TAKE 1 TABLET EVERY 6 HOURS AS NEEDED FOR MUSCLE SPASM(S) 90 tablet 5   Vitamin D, Ergocalciferol, (DRISDOL) 1.25 MG (50000 UNIT) CAPS capsule TAKE 1 CAPSULE EVERY SUNDAY. 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   Hypertrophic scar of skin 09/29/2016   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   Mixed hyperlipidemia    Peripheral vascular disease, asymptomatic (HCC) 05/23/2017   80-99% stenosis in the left internal coronary artery   Precordial chest pain 12/26/2015   Shortness of  breath 12/26/2015   Testicular hypofunction    Vitamin D deficiency    Past Surgical History:  Procedure Laterality Date   GASTROSTOMY W/ FEEDING TUBE     LEFT HEART CATH AND CORONARY ANGIOGRAPHY N/A 05/25/2017   Procedure: LEFT HEART CATH AND CORONARY ANGIOGRAPHY;  Surgeon: [place], Peter M, MD;  Location: [facility];  Service: Cardiovascular;  Laterality: N/A;   PORTACATH PLACEMENT     THROAT SURGERY      Family History  Problem Relation Age of Onset   Arthritis Mother    Hypertension Mother    Cancer - Other Maternal Grandmother    Social History   Socioeconomic History   Marital status: Married  Spouse name: Mark Mejia   Number of children: 1   Years of education: college   Highest education level: Not on file  Occupational History   Occupation: Retired  Tobacco Use   Smoking status: Never   Smokeless tobacco: Never  Vaping Use   Vaping status: Never Used  Substance and Sexual Activity   Alcohol use: No   Drug use: No   Sexual activity: Not on file  Other Topics Concern   Not on file  Social History Narrative   Currently retired/disabled   Social Determinants of Health   Financial Resource Strain: Low Risk  (04/07/2022)   Overall Financial Resource Strain (CARDIA)    Difficulty of Paying Living Expenses: Not hard at all  Food Insecurity: No Food Insecurity (02/17/2021)   Hunger Vital Sign    Worried About Running Out of Food in the Last Year: Never true    Ran Out of Food in the Last Year: Never true  Transportation Needs: No Transportation Needs (04/07/2022)   PRAPARE - Administrator, Civil Service (Medical): No    Lack of Transportation (Non-Medical): No  Physical Activity: Insufficiently Active (06/18/2022)   Exercise Vital Sign    Days of Exercise per Week: 6 days    Minutes of Exercise per Session: 10 min  Stress: No Stress Concern Present (06/18/2022)   [employer] of Occupational Health - Occupational Stress Questionnaire     Feeling of Stress : Not at all  Social Connections: Moderately Integrated (06/18/2022)   Social Connection and Isolation Panel [NHANES]    Frequency of Communication with Friends and Family: Three times a week    Frequency of Social Gatherings with Friends and Family: Three times a week    Attends Religious Services: More than 4 times per year    Active Member of Clubs or Organizations: No    Attends Banker Meetings: Never    Marital Status: Married    Objective:  BP (!) 104/56   Pulse 68   Temp (!) 96.5 F (35.8 C)   Resp 18   Ht 5\' 11"  (1.803 m)   Wt 217 lb 9.6 oz (98.7 kg)   BMI 30.35 kg/m      09/09/2022    2:41 PM 06/18/2022    9:03 AM 05/06/2022   11:24 AM  BP/Weight  Systolic BP 104 128 130  Diastolic BP 56 64 78  Wt. (Lbs) 217.6 210 209  BMI 30.35 kg/m2 29.29 kg/m2 29.99 kg/m2    Physical Exam Vitals reviewed.  Constitutional:      Appearance: Normal appearance. He is normal weight.  Cardiovascular:     Rate and Rhythm: Normal rate and regular rhythm.     Heart sounds: No murmur heard. Pulmonary:     Effort: Pulmonary effort is normal.     Breath sounds: Normal breath sounds.  Abdominal:     General: Abdomen is flat. Bowel sounds are normal.     Palpations: Abdomen is soft.     Tenderness: There is no abdominal tenderness.  Neurological:     Mental Status: He is alert and oriented to person, place, and time.  Psychiatric:        Mood and Affect: Mood normal.        Behavior: Behavior normal.     Diabetic Foot Exam - Simple   No data filed      Lab Results  Component Value Date   WBC 3.5 06/18/2022   HGB 11.9 (L)  06/18/2022   HCT 37.4 (L) 06/18/2022   PLT 138 (L) 06/18/2022   GLUCOSE 86 06/18/2022   CHOL 104 06/18/2022   TRIG 92 06/18/2022   HDL 31 (L) 06/18/2022   LDLCALC 55 06/18/2022   ALT 23 06/18/2022   AST 29 06/18/2022   NA 141 06/18/2022   K 4.4 06/18/2022   CL 103 06/18/2022   CREATININE 0.97 06/18/2022   BUN 8  06/18/2022   CO2 24 06/18/2022   TSH 3.870 06/18/2022   INR 1.1 05/23/2017      Assessment & Plan:    Other specified hypotension Assessment & Plan: Hold amlodipine. Decrease metoprolol to 25 mg daily every morning.   Hold Lasix.  Hold Tamsulosin. Follow-up with Dr. Melvyn Neth as scheduled.   Mediastinal lymphadenopathy -     CT CHEST W CONTRAST; Future     No orders of the defined types were placed in this encounter.   Orders Placed This Encounter  Procedures   CT Chest W Contrast     Follow-up: No follow-ups on file.   I,Katherina A Bramblett,acting as a scribe for [name], MD.,have documented all relevant documentation on the behalf of [name], MD,as directed by  [name], MD while in the presence of [name], MD.   Clayborn Bigness I Leal-Borjas,acting as a scribe for [name], MD.,have documented all relevant documentation on the behalf of [name], MD,as directed by  [name], MD while in the presence of [name], MD.    An After Visit Summary was printed and given to the patient.  [name], MD [facility] [phone]

## 2022-09-10 DIAGNOSIS — I959 Hypotension, unspecified: Secondary | ICD-10-CM | POA: Insufficient documentation

## 2022-09-10 NOTE — Assessment & Plan Note (Signed)
Hold amlodipine. Decrease metoprolol to 25 mg daily every morning.   Hold Lasix.  Hold Tamsulosin. Follow-up with Dr. Lewis as scheduled.  

## 2022-09-12 ENCOUNTER — Encounter: Payer: Self-pay | Admitting: Family Medicine

## 2022-09-12 DIAGNOSIS — R590 Localized enlarged lymph nodes: Secondary | ICD-10-CM | POA: Insufficient documentation

## 2022-09-14 ENCOUNTER — Telehealth: Payer: Self-pay

## 2022-09-14 NOTE — Telephone Encounter (Signed)
Transition Care Management Follow-up Telephone Call Date of discharge and from where: 09/08/2022 [facility] How have you been since you were released from the hospital? Patient stated he is still not feeling better, low energy. Any questions or concerns? No  Items Reviewed: Did the pt receive and understand the discharge instructions provided? Yes  Medications obtained and verified?  No medication prescribed. Other? No  Any new allergies since your discharge? No  Dietary orders reviewed? Yes Do you have support at home? Yes   Follow up appointments reviewed:  PCP Hospital f/u appt confirmed? Yes  Scheduled to see [name], MD on 10/18/2022 @ [facility]. Specialist Hospital f/u appt confirmed? Yes  Scheduled to see Weston Settle, MD on 09/17/2022 @ [facility] Are transportation arrangements needed? No  If their condition worsens, is the pt aware to call PCP or go to the Emergency Dept.? Yes Was the patient provided with contact information for the PCP's office or ED? Yes Was to pt encouraged to call back with questions or concerns? Yes  Kinsley Holderman [facility]  [facility] Population Health Community Resource Care Guide   [email or web address]  ?? [phone]   Website: [email or web address]

## 2022-09-15 ENCOUNTER — Ambulatory Visit: Payer: Medicare PPO | Admitting: Oncology

## 2022-09-16 NOTE — Progress Notes (Unsigned)
[name]  [address] [place],  [place]  [address] [phone]  Clinic Day:  09/16/2022  Referring physician: [name], MD   HISTORY OF PRESENT ILLNESS:  The patient is a 67 y.o. 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 Since that time, all follow-up exams, scans, and endoscopies have shown no evidence of disease recurrence.   His last EGD in April 2024 showed no evidence of disease recurrence.  However, he comes in today as a recent neck CT showed enlarging mediastinal       He comes in today for routine follow up.  Since his last visit, the patient has been doing fairly well.  He still has had occasional dizzy spells, for which additional studies are scheduled to be done in the near future to definitively rule out carotid arterial disease.  He denies having other significant changes in his health over these past 6 months.Of n  PHYSICAL EXAM:  There were no vitals taken for this visit. Wt Readings from Last 3 Encounters:  09/09/22 217 lb 9.6 oz (98.7 kg)  06/18/22 210 lb (95.3 kg)  05/06/22 209 lb (94.8 kg)   There is no height or weight on file to calculate BMI. Performance status (ECOG): {CHL ONC [id number] Physical Exam  LABS:      Latest Ref Rng & Units 06/18/2022    9:30 AM 05/06/2022   12:13 PM 02/16/2022    4:05 PM  CBC  WBC 3.4 - 10.8 x10E3/uL 3.5  3.9  4.2   Hemoglobin 13.0 - 17.7 g/dL 01.0  93.2  35.5   Hematocrit 37.5 - 51.0 % 37.4  38.7  44.8   Platelets 150 - 450 x10E3/uL 138  152  142       Latest Ref Rng & Units 06/18/2022    9:30 AM 05/06/2022   12:13 PM 02/16/2022    4:05 PM  CMP  Glucose 70 - 99 mg/dL 86  74  87   BUN 8 - 27 mg/dL 8  12  13    Creatinine 0.76 - 1.27 mg/dL 7.32  2.02  5.42   Sodium 134 - 144 mmol/L 141  139  140   Potassium 3.5 - 5.2 mmol/L 4.4  4.5  4.2   Chloride 96 -  106 mmol/L 103  102  103   CO2 20 - 29 mmol/L 24  22  23    Calcium 8.6 - 10.2 mg/dL 9.4  9.6  9.7   Total Protein 6.0 - 8.5 g/dL 7.5  7.4  7.9   Total Bilirubin 0.0 - 1.2 mg/dL 0.6  0.5  0.5   Alkaline Phos 44 - 121 IU/L 55  56  61   AST 0 - 40 IU/L 29  28  34   ALT 0 - 44 IU/L 23  21  24       No results found for: "CEA1", "CEA" / No results found for: "CEA1", "CEA" Lab Results  Component Value Date   PSA1 0.5 06/18/2022   No results found for: "HCW237" No results found for: "CAN125"  No results found for: "TOTALPROTELP", "ALBUMINELP", "A1GS", "A2GS", "BETS", "BETA2SER", "GAMS", "MSPIKE", "SPEI" Lab Results  Component Value Date   TIBC 378 11/09/2021   FERRITIN 41 11/09/2021   IRONPCTSAT 21 11/09/2021   No results found for: "LDH"     Component Value Date/Time   PSA1  0.5 06/18/2022 0930    Review Flowsheet       Latest Ref Rng & Units 11/09/2021 06/18/2022  Oncology Labs  Ferritin 30 - 400 ng/mL 41  -  %SAT 15 - 55 % 21  -  Prostate Specific Ag, Serum 0.0 - 4.0 ng/mL 0.6  0.5     Details             STUDIES:  His neck CT revealed the following:  FINDINGS: Pharynx and larynx: Normal. No mass or swelling.  Salivary glands: No inflammation, mass, or stone.  Thyroid: Normal.  Lymph nodes: No adenopathy in the neck.  Vascular: Not well evaluated due to non arterial timing. Major arteries are grossly patent in the neck.  Limited intracranial: Negative.  Visualized orbits: Negative.  Mastoids and visualized paranasal sinuses: Clear.  Skeleton: No evidence of acute abnormality on limited assessment. Multilevel degenerative change of the cervical spine.  Upper chest: Interval increase in enlarged partially imaged nonspecific mediastinal and hilar lymph nodes including a 1.7 cm prevascular window node (previously 1 cm in 2017). Biapical pleuroparenchymal scarring. Otherwise, visualized lung apices are clear.  IMPRESSION: 1. No acute findings, visible  mass, or adenopathy in the neck. Given reported history of cancer, direct comparison with any priors would be helpful if available to assess for change at the site of treatment. 2. Interval increase in enlarged partially imaged nonspecific mediastinal and hilar lymph nodes including a 1.7 cm pre-vascular node (previously 1 cm in 2017). Consider CT of the chest with contrast for full evaluation.  ASSESSMENT & PLAN:   Assessment/Plan:  A 67 y.o. male with *** .The patient understands all the plans discussed today and is in agreement with them.      [name] Kirby Funk, MD

## 2022-09-17 ENCOUNTER — Encounter: Payer: Self-pay | Admitting: Oncology

## 2022-09-17 ENCOUNTER — Inpatient Hospital Stay: Payer: Medicare PPO | Attending: Oncology | Admitting: Oncology

## 2022-09-17 VITALS — BP 186/86 | HR 63 | Temp 97.7°F | Resp 16 | Ht 71.0 in | Wt 213.8 lb

## 2022-09-17 DIAGNOSIS — Z8501 Personal history of malignant neoplasm of esophagus: Secondary | ICD-10-CM

## 2022-09-17 DIAGNOSIS — R590 Localized enlarged lymph nodes: Secondary | ICD-10-CM | POA: Diagnosis not present

## 2022-09-18 ENCOUNTER — Telehealth: Payer: Self-pay | Admitting: Family Medicine

## 2022-09-18 NOTE — Telephone Encounter (Signed)
Patient was seen at the cancer center yesterday and is blood pressure was 190/103 and repeat was 186/100.  I had seen the patient on September 09, 2022 for hypotension and had held his Flomax, amlodipine, and decrease his metoprolol to 25 mg once daily. Blood pressure today was 146/86.  Patient took his amlodipine last night because it was so high yesterday.  Recommendations: Restart Flomax 0.4 mg 2 at bedtime. Increase metoprolol to 25 mg twice daily. Continue to hold his amlodipine. Continue to check blood pressure at least daily at home with pulse. Keep appointment in mid August however if his blood pressure is not staying below 140/90 the patient should give [place] a call midweek.

## 2022-09-20 DIAGNOSIS — R590 Localized enlarged lymph nodes: Secondary | ICD-10-CM | POA: Diagnosis not present

## 2022-09-20 LAB — LAB REPORT - SCANNED

## 2022-09-21 ENCOUNTER — Encounter: Payer: Self-pay | Admitting: Family Medicine

## 2022-09-22 ENCOUNTER — Encounter: Payer: Self-pay | Admitting: Family Medicine

## 2022-09-24 ENCOUNTER — Encounter: Payer: Self-pay | Admitting: Family Medicine

## 2022-09-28 ENCOUNTER — Other Ambulatory Visit: Payer: Self-pay | Admitting: Oncology

## 2022-09-28 DIAGNOSIS — Z8501 Personal history of malignant neoplasm of esophagus: Secondary | ICD-10-CM

## 2022-09-29 ENCOUNTER — Telehealth: Payer: Self-pay | Admitting: Oncology

## 2022-09-29 NOTE — Telephone Encounter (Signed)
09/29/22 Spoke with wife and confirmed all appts

## 2022-09-30 ENCOUNTER — Encounter: Payer: Self-pay | Admitting: Family Medicine

## 2022-10-01 ENCOUNTER — Other Ambulatory Visit: Payer: Self-pay

## 2022-10-01 ENCOUNTER — Encounter (HOSPITAL_COMMUNITY): Payer: Self-pay

## 2022-10-01 ENCOUNTER — Emergency Department (HOSPITAL_COMMUNITY)
Admission: EM | Admit: 2022-10-01 | Discharge: 2022-10-01 | Disposition: A | Discharge status: Home / Self Care | Payer: Medicare PPO | Attending: Emergency Medicine | Admitting: Emergency Medicine

## 2022-10-01 ENCOUNTER — Emergency Department (HOSPITAL_COMMUNITY): Payer: Medicare PPO

## 2022-10-01 DIAGNOSIS — C140 Malignant neoplasm of pharynx, unspecified: Secondary | ICD-10-CM | POA: Diagnosis not present

## 2022-10-01 DIAGNOSIS — Z7982 Long term (current) use of aspirin: Secondary | ICD-10-CM | POA: Insufficient documentation

## 2022-10-01 DIAGNOSIS — Z8521 Personal history of malignant neoplasm of larynx: Secondary | ICD-10-CM | POA: Diagnosis not present

## 2022-10-01 DIAGNOSIS — R058 Other specified cough: Secondary | ICD-10-CM | POA: Diagnosis not present

## 2022-10-01 DIAGNOSIS — R0602 Shortness of breath: Secondary | ICD-10-CM | POA: Diagnosis not present

## 2022-10-01 DIAGNOSIS — J40 Bronchitis, not specified as acute or chronic: Secondary | ICD-10-CM | POA: Diagnosis not present

## 2022-10-01 LAB — CBC WITH DIFFERENTIAL/PLATELET
Abs Immature Granulocytes: 0 10*3/uL (ref 0.00–0.07)
Basophils Absolute: 0 10*3/uL (ref 0.0–0.1)
Basophils Relative: 1 %
Eosinophils Absolute: 0.2 10*3/uL (ref 0.0–0.5)
Eosinophils Relative: 4 %
HCT: 42.1 % (ref 39.0–52.0)
Hemoglobin: 13.6 g/dL (ref 13.0–17.0)
Immature Granulocytes: 0 %
Lymphocytes Relative: 38 %
Lymphs Abs: 1.5 10*3/uL (ref 0.7–4.0)
MCH: 25.6 pg — ABNORMAL LOW (ref 26.0–34.0)
MCHC: 32.3 g/dL (ref 30.0–36.0)
MCV: 79.1 fL — ABNORMAL LOW (ref 80.0–100.0)
Monocytes Absolute: 0.4 10*3/uL (ref 0.1–1.0)
Monocytes Relative: 9 %
Neutro Abs: 1.9 10*3/uL (ref 1.7–7.7)
Neutrophils Relative %: 48 %
Platelets: 137 10*3/uL — ABNORMAL LOW (ref 150–400)
RBC: 5.32 MIL/uL (ref 4.22–5.81)
RDW: 13.9 % (ref 11.5–15.5)
WBC: 4 10*3/uL (ref 4.0–10.5)
nRBC: 0 % (ref 0.0–0.2)

## 2022-10-01 LAB — COMPREHENSIVE METABOLIC PANEL
ALT: 24 U/L (ref 0–44)
AST: 29 U/L (ref 15–41)
Albumin: 3.9 g/dL (ref 3.5–5.0)
Alkaline Phosphatase: 46 U/L (ref 38–126)
Anion gap: 8 (ref 5–15)
BUN: 12 mg/dL (ref 8–23)
CO2: 22 mmol/L (ref 22–32)
Calcium: 8.8 mg/dL — ABNORMAL LOW (ref 8.9–10.3)
Chloride: 103 mmol/L (ref 98–111)
Creatinine, Ser: 1.1 mg/dL (ref 0.61–1.24)
GFR, Estimated: >60 mL/min (ref >60)
Glucose, Bld: 95 mg/dL (ref 70–99)
Potassium: 3.7 mmol/L (ref 3.5–5.1)
Sodium: 133 mmol/L — ABNORMAL LOW (ref 135–145)
Total Bilirubin: 0.7 mg/dL (ref 0.3–1.2)
Total Protein: 7.4 g/dL (ref 6.5–8.1)

## 2022-10-01 MED ORDER — PREDNISONE 10 MG PO TABS: 20.0000 mg | ORAL_TABLET | Freq: Every day | ORAL | 0 refills | Status: DC

## 2022-10-01 MED ORDER — LEVOFLOXACIN 500 MG PO TABS: 500.0000 mg | ORAL_TABLET | Freq: Every day | ORAL | 0 refills | Status: DC

## 2022-10-01 NOTE — ED Provider Notes (Signed)
[place] EMERGENCY DEPARTMENT AT [facility] Provider Note   CSN: [id number] Arrival date & time: 10/01/22  1503     History {Add pertinent medical, surgical, social history, OB history to HPI:1} Chief Complaint  Patient presents with   Shortness of Breath    Mark Mejia is a 67 y.o. male.  Patient presents with shortness of breath and a cough.  Patient has a history of laryngeal cancer   Shortness of Breath      Home Medications Prior to Admission medications   Medication Sig Start Date End Date Taking? Authorizing Provider  levofloxacin (LEVAQUIN) 500 MG tablet Take 1 tablet (500 mg total) by mouth daily. 10/01/22  Yes Bethann Berkshire, MD  predniSONE (DELTASONE) 10 MG tablet Take 2 tablets (20 mg total) by mouth daily. 10/01/22  Yes Bethann Berkshire, MD  amLODipine (NORVASC) 5 MG tablet TAKE 1 TABLET EVERY DAY 05/29/22   CoxFritzi Mandes, MD  aspirin EC 81 MG tablet Take 81 mg by mouth daily.     [provider]  ciclopirox (PENLAC) 8 % solution Apply topically at bedtime. Apply over nail and surrounding skin. Apply daily over previous coat. After seven (7) days, may remove with alcohol and continue cycle. 08/30/22   Standiford, Jenelle Mages, DPM  dorzolamide-timolol (COSOPT) 22.3-6.8 MG/ML ophthalmic solution Place 1 drop into the right eye 2 (two) times daily. 07/11/21   [provider]  eszopiclone (LUNESTA) 2 MG TABS tablet TAKE 1 TABLET IMMEDIATELY BEFORE BEDTIME AS NEEDED FOR SLEEP 08/25/22   Cox, Kirsten, MD  famotidine (PEPCID) 40 MG tablet Take 40 mg by mouth daily. 05/11/22   [provider]  Glycerin-Hypromellose-PEG 400 ([facility] DRY EYE RELIEF OP) Place 2 drops into both eyes daily as needed (for dry eyes).    [provider]  HYDROcodone-acetaminophen (NORCO/VICODIN) 5-325 MG tablet Take 1 tablet by mouth every 6 (six) hours as needed for moderate pain. 04/29/21   Cox, Fritzi Mandes, MD  ketoconazole (NIZORAL) 2 % cream Apply 1 Application  topically daily. 08/30/22   Standiford, Jenelle Mages, DPM  latanoprost (XALATAN) 0.005 % ophthalmic solution Place 1 drop into the right eye at bedtime.    [provider]  levothyroxine (SYNTHROID) 88 MCG tablet TAKE 1 TABLET EVERY DAY BEFORE BREAKFAST 08/10/22   Marianne Sofia, PA-C  meloxicam (MOBIC) 7.5 MG tablet TAKE 1 TABLET TWICE DAILY 08/10/22   Marianne Sofia, PA-C  metoprolol tartrate (LOPRESSOR) 25 MG tablet TAKE 1 TABLET TWICE DAILY 08/10/22   Georgeanna Lea, MD  nitroGLYCERIN (NITROSTAT) 0.4 MG SL tablet Place 1 tablet (0.4 mg total) under the tongue every 5 (five) minutes as needed for chest pain. 10/15/21   Georgeanna Lea, MD  omeprazole (PRILOSEC) 40 MG capsule Take 1 capsule (40 mg total) by mouth daily. 05/10/22   Georgeanna Lea, MD  rosuvastatin (CRESTOR) 20 MG tablet TAKE 1 TABLET EVERY DAY 05/03/22   Georgeanna Lea, MD  sildenafil (VIAGRA) 100 MG tablet TAKE 1/2 TO 1 TABLET EVERY DAY 30-60 MIN PRIOR TO INTERCOURSE AS NEEDED FOR ERECTILE DYSFUNCTION AS DIRECTED 06/18/22   Cox, Fritzi Mandes, MD  tamsulosin (FLOMAX) 0.4 MG CAPS capsule TAKE 2 CAPSULES EVERY DAY 05/27/22   Cox, Fritzi Mandes, MD  testosterone cypionate (DEPOTESTOSTERONE CYPIONATE) 200 MG/ML injection INJECT 200 MG ( ) AS DIRECTED EVERY 14 DAYS. VIAL IS FOR SINGLE USE ONLY 08/10/22   Marianne Sofia, PA-C  tiZANidine (ZANAFLEX) 4 MG tablet TAKE 1 TABLET EVERY 6 HOURS AS NEEDED FOR MUSCLE SPASM(S)  08/25/22   Cox, Fritzi Mandes, MD  Vitamin D, Ergocalciferol, (DRISDOL) 1.25 MG (50000 UNIT) CAPS capsule TAKE 1 CAPSULE EVERY SUNDAY. 05/26/22   Cox, Kirsten, MD      Allergies    Patient has no known allergies.    Review of Systems   Review of Systems  Respiratory:  Positive for shortness of breath.     Physical Exam Updated Vital Signs BP (!) 161/78 (BP Location: Left Arm)   Pulse (!) 54   Temp 98.2 F (36.8 C) (Oral)   Resp 15   Ht 5\' 11" (1.803 m)   Wt 95.3 kg   SpO2 100%   BMI 29.29 kg/m  Physical Exam  ED  Results / Procedures / Treatments   Labs (all labs ordered are listed, but only abnormal results are displayed) Labs Reviewed  COMPREHENSIVE METABOLIC PANEL - Abnormal; Notable for the following components:      Result Value   Sodium 133 (*)    Calcium 8.8 (*)    All other components within normal limits  CBC WITH DIFFERENTIAL/PLATELET - Abnormal; Notable for the following components:   MCV 79.1 (*)    MCH 25.6 (*)    Platelets 137 (*)    All other components within normal limits    EKG None  Radiology DG Chest 2 View  Result Date: 10/01/2022 CLINICAL DATA:  Shortness of breath. Productive cough for a month. History of throat cancer. EXAM: CHEST - 2 VIEW COMPARISON:  X-ray 09/03/2021 and older. CT scan 09/20/2022 of the chest. FINDINGS: Stable cardiopericardial silhouette. No consolidation, pneumothorax or effusion. No edema. Degenerative changes seen of the spine. The abnormal nodes seen by CT scan are not well appreciated on this x-ray. IMPRESSION: No acute cardiopulmonary disease. Electronically Signed   By: Ashok  Gupta M.D.   On: 10/01/2022 16:14    Procedures Procedures  {Document cardiac monitor, telemetry assessment procedure when appropriate:1}  Medications Ordered in ED Medications - No data to display  ED Course/ Medical Decision Making/ A&P   {   Click here for ABCD2, HEART and other calculatorsREFRESH Note before signing :1}                              Medical Decision Making Amount and/or Complexity of Data Reviewed Labs: ordered. Radiology: ordered.  Risk Prescription drug management.   Patient with bronchitis symptoms.  Please put on Levaquin and prednisone and will follow-up with pulmonary or his family doctor in a couple weeks after he gets a CT  {Document critical care time when appropriate:1} {Document review of labs and clinical decision tools ie heart score, Chads2Vasc2 etc:1}  {Document your independent review of radiology images, and any  outside records:1} {Document your discussion with family members, caretakers, and with consultants:1} {Document social determinants of health affecting pt\'s care:1} {Document your decision making why or why not admission, treatments were needed:1} Final Clinical Impression(s) / ED Diagnoses Final diagnoses:  Bronchitis    Rx / DC Orders ED Discharge Orders          Ordered    levofloxacin (LEVAQUIN) 500 MG tablet  Daily        10/01/22 2102    predniSONE (DELTASONE) 10 MG tablet  Daily        08 /02/24 2102

## 2022-10-01 NOTE — Discharge Instructions (Signed)
Follow-up with your doctor or the pulmonary specialist after you get your CT scan of the chest

## 2022-10-01 NOTE — ED Triage Notes (Signed)
Pt concerned that he may be developing pneumonia. Complains of feeling SOB, and productive cough. Denies pain, or fevers.

## 2022-10-06 DIAGNOSIS — I700 Atherosclerosis of aorta: Secondary | ICD-10-CM | POA: Diagnosis not present

## 2022-10-06 DIAGNOSIS — R911 Solitary pulmonary nodule: Secondary | ICD-10-CM | POA: Diagnosis not present

## 2022-10-06 DIAGNOSIS — Z8501 Personal history of malignant neoplasm of esophagus: Secondary | ICD-10-CM | POA: Diagnosis not present

## 2022-10-06 DIAGNOSIS — Z79899 Other long term (current) drug therapy: Secondary | ICD-10-CM | POA: Diagnosis not present

## 2022-10-06 NOTE — Progress Notes (Unsigned)
[facility]  [address] [place],  [place]  [address]  [phone]  Clinic Day:  10/07/2022  Referring physician: [name], MD  TELEHEALTH VISIT  HISTORY OF PRESENT ILLNESS:  The patient is a 67 y.o. 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  He was brought back to my attention recently as a chest CT showed subtly increased lymphadenopathy in his mediastinum of undisclosed etiology.  Due to its ambiguity, a PET scan was ordered to determine if his mediastinal lymphadenopathy is malignant in nature.  Since his last visit, the patient has been doing much better.  He claims his respiratory symptoms from a presumed upper respiratory infection have essentially dissipated.    PHYSICAL EXAM:  DEFERRED   STUDIES:  His PET scan revealed the following:  FINDINGS: Mediastinal blood pool activity: SUV max 2.9  Liver activity: SUV max NA  NECK: No hypermetabolic lymph nodes in the neck.  Incidental CT findings: None.  CHEST: The AP window lymph node seen to be slightly increased in size on recent diagnostic chest CT shows no hypermetabolism on PET imaging with SUV max = 2.7. No uptake above background blood pool levels in the right hilar lymph node identified on the prior study . No hypermetabolic lymphadenopathy in the chest on today's study.  Subtle nodularity seen in the right lower lobe previously with 4 mm index nodule measured on the prior study shows no discernible hypermetabolism on PET imaging.  Incidental CT findings: Heart size upper normal. No pericardial effusion. Mild atherosclerotic calcification is noted in the wall of the thoracic aorta. No pleural effusion.  ABDOMEN/PELVIS: No abnormal hypermetabolic activity within the liver, pancreas, adrenal glands, or spleen. No hypermetabolic lymph nodes in the  abdomen. 14 mm short axis right groin lymph node demonstrates SUV max = 2.3. No evidence for hypermetabolic lymphadenopathy in the pelvis.  Incidental CT findings: Mild atherosclerotic calcification noted in the abdominal aorta without aneurysm..  SKELETON: No focal hypermetabolic activity to suggest skeletal metastasis.  Incidental CT findings: No worrisome lytic or sclerotic osseous abnormality.  IMPRESSION: 1. The index AP window and right hilar lymph nodes seen to be mildly increased in size on recent diagnostic chest CT show no hypermetabolism on PET imaging with FDG uptake below blood pool levels.. 2. 14 mm short axis right groin lymph node shows low level FDG uptake, likely reactive as it is also below blood pool activity and would be a distinctly unusual site for isolated metastatic esophageal cancer. Nevertheless, follow-up likely warranted to ensure stability. 3. Subtle nodularity seen in the right lower lobe on the prior study including 4 mm nodular component shows no discernible hypermetabolism on PET imaging. 4. Aortic Atherosclerosis (ICD10-I70.0).  ASSESSMENT & PLAN:  Assessment/Plan:  A 67 y.o. male with a history of stage IIA squamous cell esophageal cancer, for which he is over 9-1/2 years cancer free.  I went over all of his PET scan images with him, for which he understands there is no evidence of any hypermetabolic lymphadenopathy or occult disease elsewhere.  Understandably, he was pleased with his scans.  Overall, I am unconvinced this gentleman has any evidence of disease recurrence.  As he remains cured of his esophageal cancer, I do feel comfortable turning his care back over to his other physicians.  The patient understands all the plans discussed today and is in agreement with  them.     Kirby Funk, MD

## 2022-10-07 ENCOUNTER — Telehealth: Payer: Self-pay

## 2022-10-07 ENCOUNTER — Inpatient Hospital Stay: Payer: Medicare PPO | Attending: Oncology | Admitting: Oncology

## 2022-10-07 ENCOUNTER — Encounter: Payer: Self-pay | Admitting: Family Medicine

## 2022-10-07 DIAGNOSIS — R590 Localized enlarged lymph nodes: Secondary | ICD-10-CM | POA: Diagnosis not present

## 2022-10-07 NOTE — Telephone Encounter (Signed)
Transition Care Management Unsuccessful Follow-up Telephone Call  Date of discharge and from where:  10/01/2022 [facility]  Attempts:  1st Attempt  Reason for unsuccessful TCM follow-up call:  Left voice message   [facility]  [facility] Population Health Community Resource Care Guide   [email or web address]  ?? [phone]   Website: [email or web address]  [email or web address]

## 2022-10-08 ENCOUNTER — Telehealth: Payer: Self-pay

## 2022-10-08 NOTE — Telephone Encounter (Signed)
Transition Care Management Follow-up Telephone Call Date of discharge and from where: 10/01/2022 [facility] How have you been since you were released from the hospital? Patient stated he still has a lot of congestion at night but he is feeling a little better. Any questions or concerns? No  Items Reviewed: Did the pt receive and understand the discharge instructions provided? Yes  Medications obtained and verified? Yes  Other?  Per patient request verified home mailing address to send food pantry list. Any new allergies since your discharge? No  Dietary orders reviewed? Yes Do you have support at home? Yes   Follow up appointments reviewed:  PCP Hospital f/u appt confirmed? Yes  Scheduled to see [name], MD on 10/18/2022 @ [facility]. Specialist Hospital f/u appt confirmed? No  Scheduled to see  on  @ . Are transportation arrangements needed? No  If their condition worsens, is the pt aware to call PCP or go to the Emergency Dept.? Yes Was the patient provided with contact information for the PCP's office or ED? Yes Was to pt encouraged to call back with questions or concerns? Yes   [facility]  [facility] Population Health Community Resource Care Guide   [email or web address]  ?? [phone]   Website: [email or web address]  [email or web address]

## 2022-10-13 ENCOUNTER — Other Ambulatory Visit: Payer: Self-pay | Admitting: Family Medicine

## 2022-10-13 DIAGNOSIS — N401 Enlarged prostate with lower urinary tract symptoms: Secondary | ICD-10-CM

## 2022-10-18 ENCOUNTER — Ambulatory Visit: Payer: Medicare PPO | Admitting: Family Medicine

## 2022-10-18 VITALS — BP 124/70 | HR 60 | Temp 96.4°F | Resp 18 | Ht 70.0 in | Wt 206.6 lb

## 2022-10-18 DIAGNOSIS — I110 Hypertensive heart disease with heart failure: Secondary | ICD-10-CM | POA: Diagnosis not present

## 2022-10-18 DIAGNOSIS — K219 Gastro-esophageal reflux disease without esophagitis: Secondary | ICD-10-CM | POA: Diagnosis not present

## 2022-10-18 DIAGNOSIS — E782 Mixed hyperlipidemia: Secondary | ICD-10-CM | POA: Diagnosis not present

## 2022-10-18 DIAGNOSIS — E291 Testicular hypofunction: Secondary | ICD-10-CM

## 2022-10-18 DIAGNOSIS — E034 Atrophy of thyroid (acquired): Secondary | ICD-10-CM | POA: Diagnosis not present

## 2022-10-18 DIAGNOSIS — I5030 Unspecified diastolic (congestive) heart failure: Secondary | ICD-10-CM | POA: Diagnosis not present

## 2022-10-18 NOTE — Progress Notes (Unsigned)
Subjective:  Patient ID: [name], male    DOB: June 04, 1955  Age: 67 y.o. MRN: [id number]  Chief Complaint  Patient presents with   Medical Management of Chronic Issues    HPI Hypertension: Checking bp daily. 155-160/80-86 in the mornings and 120's /70 's 2 hours after taking medications . ON aspirin 81 mg daily, Metoprolol 25 mg twice a day, and amlodipine 5 mg daily.   Hypothyroidism: Takes Levothyroxine 88 mcg daily.    Hyperlipidemia: Rosuvastatin 20 mg daily.   GERD: Omeprazole 40 mg daily in the afternoon, Pepcid 40 qhs   Hypogonadism male: Testosterone cypionate 200 mg every 14 days--Stopped but wants to discuss restarting   BPH: Tamsulosin 0.4 mg take 2 tablets twice a day.   Insomnia: Lunesta 2 mg before bed.      10/18/2022   10:06 AM 05/06/2022   11:25 AM 02/16/2022    3:11 PM 11/20/2021    9:42 AM 02/17/2021   11:15 AM  Depression screen PHQ 2/9  Decreased Interest 0 0 0 0 0  Down, Depressed, Hopeless 0 0 0 0 0  PHQ - 2 Score 0 0 0 0 0  Altered sleeping 3      Tired, decreased energy 0      Change in appetite 0      Feeling bad or failure about yourself  0      Trouble concentrating 0      Moving slowly or fidgety/restless 0      Suicidal thoughts 0      PHQ-9 Score 3      Difficult doing work/chores Not difficult at all            10/18/2022   10:06 AM  Fall Risk   Falls in the past year? 0  Number falls in past yr: 0  Injury with Fall? 0  Risk for fall due to : No Fall Risks  Follow up Falls evaluation completed;Falls prevention discussed    Patient Care Team: Mark Ohara, MD as PCP - General (Internal Medicine) Mark Lea, MD as Consulting Physician (Cardiology) Mark Mejia, [facility] (Inactive) as Consulting Physician (Pharmacist)   Review of Systems  Constitutional:  Negative for chills and fever.  HENT:  Negative for congestion, rhinorrhea and sore throat.   Respiratory:  Positive for cough (productive thick cough). Negative  for shortness of breath.   Cardiovascular:  Negative for chest pain and palpitations.  Gastrointestinal:  Negative for abdominal pain, constipation, diarrhea, nausea and vomiting.  Genitourinary:  Negative for dysuria and urgency.  Musculoskeletal:  Negative for arthralgias, back pain and myalgias.  Neurological:  Negative for dizziness and headaches.  Psychiatric/Behavioral:  Negative for dysphoric mood. The patient is not nervous/anxious.     Current Outpatient Medications on File Prior to Visit  Medication Sig Dispense Refill   amLODipine (NORVASC) 5 MG tablet TAKE 1 TABLET EVERY DAY 90 tablet 3   aspirin EC 81 MG tablet Take 81 mg by mouth daily.      ciclopirox (PENLAC) 8 % solution Apply topically at bedtime. Apply over nail and surrounding skin. Apply daily over previous coat. After seven (7) days, may remove with alcohol and continue cycle. 6.6 mL 0   dorzolamide-timolol (COSOPT) 22.3-6.8 MG/ML ophthalmic solution Place 1 drop into the right eye 2 (two) times daily.     eszopiclone (LUNESTA) 2 MG TABS tablet TAKE 1 TABLET IMMEDIATELY BEFORE BEDTIME AS NEEDED FOR SLEEP 30 tablet 5   famotidine (PEPCID)  40 MG tablet Take 40 mg by mouth daily.     Glycerin-Hypromellose-PEG 400 ([facility] DRY EYE RELIEF OP) Place 2 drops into both eyes daily as needed (for dry eyes).     HYDROcodone-acetaminophen (NORCO/VICODIN) 5-325 MG tablet Take 1 tablet by mouth every 6 (six) hours as needed for moderate pain. 30 tablet 0   ketoconazole (NIZORAL) 2 % cream Apply 1 Application topically daily. 60 g 0   latanoprost (XALATAN) 0.005 % ophthalmic solution Place 1 drop into the right eye at bedtime.     levothyroxine (SYNTHROID) 88 MCG tablet TAKE 1 TABLET EVERY DAY BEFORE BREAKFAST 90 tablet 0   meloxicam (MOBIC) 7.5 MG tablet TAKE 1 TABLET TWICE DAILY 180 tablet 0   metoprolol tartrate (LOPRESSOR) 25 MG tablet TAKE 1 TABLET TWICE DAILY 180 tablet 3   nitroGLYCERIN (NITROSTAT) 0.4 MG SL tablet Place 1 tablet  (0.4 mg total) under the tongue every 5 (five) minutes as needed for chest pain. 25 tablet 1   omeprazole (PRILOSEC) 40 MG capsule Take 1 capsule (40 mg total) by mouth daily. 90 capsule 2   rosuvastatin (CRESTOR) 20 MG tablet TAKE 1 TABLET EVERY DAY 90 tablet 3   sildenafil (VIAGRA) 100 MG tablet TAKE 1/2 TO 1 TABLET EVERY DAY 30-60 MIN PRIOR TO INTERCOURSE AS NEEDED FOR ERECTILE DYSFUNCTION AS DIRECTED 10 tablet 2   tamsulosin (FLOMAX) 0.4 MG CAPS capsule TAKE 2 CAPSULES EVERY DAY 180 capsule 3   tiZANidine (ZANAFLEX) 4 MG tablet TAKE 1 TABLET EVERY 6 HOURS AS NEEDED FOR MUSCLE SPASM(S) 90 tablet 5   Vitamin D, Ergocalciferol, (DRISDOL) 1.25 MG (50000 UNIT) CAPS capsule TAKE 1 CAPSULE EVERY SUNDAY. 12 capsule 3   No current facility-administered medications on file prior to visit.   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   Hypertrophic scar of skin 09/29/2016   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   Mixed hyperlipidemia    Peripheral vascular disease, asymptomatic (HCC) 05/23/2017   80-99% stenosis in the left internal coronary artery   Precordial chest pain 12/26/2015   Shortness of breath 12/26/2015   Testicular hypofunction    Vitamin D deficiency    Past Surgical History:  Procedure Laterality Date   GASTROSTOMY W/ FEEDING TUBE     LEFT HEART CATH AND CORONARY ANGIOGRAPHY N/A 05/25/2017    Procedure: LEFT HEART CATH AND CORONARY ANGIOGRAPHY;  Surgeon: [place], Peter M, MD;  Location: [facility] INVASIVE CV [facility];  Service: Cardiovascular;  Laterality: N/A;   PORTACATH PLACEMENT     THROAT SURGERY      Family History  Problem Relation Age of Onset   Arthritis Mother    Hypertension Mother    Cancer - Other Maternal Grandmother    Social History   Socioeconomic History   Marital status: Married    Spouse name: LAURA   Number of children: 1   Years of education:  12 + 2   Highest education level: Not on file  Occupational History   Occupation: Retired  Tobacco Use   Smoking status: Never   Smokeless tobacco: Never  Vaping Use   Vaping status: Never Used  Substance and Sexual Activity   Alcohol use: No   Drug use: No   Sexual activity: Yes  Other Topics Concern   Not on file  Social History Narrative   Currently retired/disabled   Social Determinants of Health   Financial Resource Strain: Patient Declined (10/15/2022)   Overall Financial Resource Strain (CARDIA)    Difficulty of Paying Living Expenses: Patient declined  Food Insecurity: Patient Declined (10/15/2022)   Hunger Vital Sign    Worried About Running Out of Food in the Last Year: Patient declined    Ran Out of Food in the Last Year: Patient declined  Transportation Needs: Unknown (10/15/2022)   PRAPARE - Transportation    Lack of Transportation (Medical): No    Lack of Transportation (Non-Medical): Patient declined  Physical Activity: Sufficiently Active (10/15/2022)   Exercise Vital Sign    Days of Exercise per Week: 6 days    Minutes of Exercise per Session: 30 min  Stress: No Stress Concern Present (10/15/2022)   [employer] of Occupational Health - Occupational Stress Questionnaire    Feeling of Stress : Not at all  Social Connections: Unknown (10/15/2022)   Social Connection and Isolation Panel [NHANES]    Frequency of Communication with Friends and Family: Patient declined    Frequency of Social  Gatherings with Friends and Family: Patient declined    Attends Religious Services: Patient declined    Database administrator or Organizations: Patient declined    Attends Banker Meetings: Never    Marital Status: Patient declined    Objective:  BP 124/70   Pulse 60   Temp (!) 96.4 F (35.8 C)   Resp 18   Ht 5\' 10"  (1.778 Mejia)   Wt 206 lb 9.6 oz (93.7 kg)   BMI 29.64 kg/Mejia      10/18/2022   10:03 AM 10/01/2022    9:00 PM 10/01/2022    8:32 PM  BP/Weight  Systolic BP 124 166 161  Diastolic BP 70 91 78  Wt. 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   Gastroesophageal reflux disease without esophagitis Assessment & Plan: Continue omeprazole 40 mg daily and pepcid 40 mg at bedtime.   Mixed hyperlipidemia Assessment & Plan: Well controlled.  No changes to medicines. Continue Rosuvastatin 20 mg daily. Continue to  work on eating a healthy diet and exercise.  .     Male hypogonadism Assessment & Plan: Change Testosterone cypionate 200 mg every 14 days to  xyosted  Orders: Barbaraann Cao; Inject 0.5 mLs into the skin once a week.  Dispense: 2 mL; Refill: 3  Atrophy of thyroid Assessment & Plan: Previously well controlled Continue Synthroid at current dose         Meds ordered this encounter  Medications   Testosterone Enanthate (XYOSTED) 50 MG/0.5ML SOAJ    Sig: Inject 0.5 mLs into the skin once a week.    Dispense:  2 mL    Refill:  3    No orders of the defined types were placed in this encounter.    Follow-up: No follow-ups on file.   I,Marla I Leal-Borjas,acting as a scribe for Mark Ohara, MD.,have documented all relevant documentation on the behalf of Mark Ohara, MD,as directed by  Mark Ohara, MD while in the presence of Mark Ohara, MD.   An After Visit Summary was printed and given to the patient.  Mark Ohara, MD [facility] [phone]

## 2022-10-18 NOTE — Patient Instructions (Signed)
Increase amlodipine to 5 mg twice a daily.

## 2022-10-22 NOTE — Assessment & Plan Note (Signed)
The current medical regimen is effective;  continue present plan and medications.  Testosterone cypionate 200 mg every 14 days

## 2022-10-22 NOTE — Assessment & Plan Note (Addendum)
Previously well controlled Continue Synthroid at current dose  

## 2022-10-22 NOTE — Assessment & Plan Note (Signed)
 Increase amlodipine to 5 mg twice a daily.

## 2022-10-22 NOTE — Assessment & Plan Note (Signed)
Continue omeprazole 40 mg daily and pepcid 40 mg at bedtime.

## 2022-10-22 NOTE — Assessment & Plan Note (Addendum)
Well controlled.  No changes to medicines. Continue Rosuvastatin 20 mg daily. Continue to work on eating a healthy diet and exercise.  Marland Kitchen

## 2022-10-24 ENCOUNTER — Encounter: Payer: Self-pay | Admitting: Family Medicine

## 2022-10-24 MED ORDER — XYOSTED 50 MG/0.5ML ~~LOC~~ SOAJ: 0.5000 mL | SUBCUTANEOUS | 3 refills | Status: DC

## 2022-11-05 DIAGNOSIS — H401113 Primary open-angle glaucoma, right eye, severe stage: Secondary | ICD-10-CM | POA: Diagnosis not present

## 2022-11-11 ENCOUNTER — Other Ambulatory Visit: Payer: Self-pay | Admitting: Physician Assistant

## 2022-11-13 ENCOUNTER — Other Ambulatory Visit: Payer: Self-pay | Admitting: Family Medicine

## 2022-12-01 ENCOUNTER — Ambulatory Visit: Payer: Medicare PPO

## 2022-12-01 VITALS — BP 122/64 | HR 61 | Resp 16 | Wt 211.2 lb

## 2022-12-01 DIAGNOSIS — E291 Testicular hypofunction: Secondary | ICD-10-CM

## 2022-12-01 DIAGNOSIS — Z23 Encounter for immunization: Secondary | ICD-10-CM

## 2022-12-01 DIAGNOSIS — Z0000 Encounter for general adult medical examination without abnormal findings: Secondary | ICD-10-CM

## 2022-12-01 MED ORDER — TESTOSTERONE CYPIONATE 200 MG/ML IM SOLN
200.0000 mg | INTRAMUSCULAR | Status: DC
Start: 2022-12-01 — End: 2023-04-19
  Administered 2022-12-01: 200 mg via INTRAMUSCULAR

## 2022-12-01 NOTE — Patient Instructions (Signed)
Mark Mejia , Thank you for taking time to come for your Medicare Wellness Visit. I appreciate your ongoing commitment to your health goals. Please review the following plan we discussed and let me know if I can assist you in the future.    This is a list of the screening recommended for you and due dates:  Health Maintenance  Topic Date Due   Zoster (Shingles) Vaccine (1 of 2) Never done   DTaP/Tdap/Td vaccine (2 - Td or Tdap) 05/30/2021   Colon Cancer Screening  01/14/2023   COVID-19 Vaccine (7 - 2023-24 season) 01/26/2023   Medicare Annual Wellness Visit  12/01/2023   Pneumonia Vaccine  Completed   Flu Shot  Completed   Hepatitis C Screening  Completed   HPV Vaccine  Aged Out    Preventive Care 65 Years and Older, Male  Preventive care refers to lifestyle choices and visits with your health care provider that can promote health and wellness. What does preventive care include? A yearly physical exam. This is also called an annual well check. Dental exams once or twice a year. Routine eye exams. Ask your health care provider how often you should have your eyes checked. Personal lifestyle choices, including: Daily care of your teeth and gums. Regular physical activity. Eating a healthy diet. Avoiding tobacco and drug use. Limiting alcohol use. Practicing safe sex. Taking low doses of aspirin every day. Taking vitamin and mineral supplements as recommended by your health care provider. What happens during an annual well check? The services and screenings done by your health care provider during your annual well check will depend on your age, overall health, lifestyle risk factors, and family history of disease. Counseling  Your health care provider may ask you questions about your: Alcohol use. Tobacco use. Drug use. Emotional well-being. Home and relationship well-being. Sexual activity. Eating habits. History of falls. Memory and ability to understand (cognition). Work  and work Astronomer. Screening  You may have the following tests or measurements: Height, weight, and BMI. Blood pressure. Lipid and cholesterol levels. These may be checked every 5 years, or more frequently if you are over 32 years old. Skin check. Lung cancer screening. You may have this screening every year starting at age 33 if you have a 30-pack-year history of smoking and currently smoke or have quit within the past 15 years. Fecal occult blood test (FOBT) of the stool. You may have this test every year starting at age 57. Flexible sigmoidoscopy or colonoscopy. You may have a sigmoidoscopy every 5 years or a colonoscopy every 10 years starting at age 31. Prostate cancer screening. Recommendations will vary depending on your family history and other risks. Hepatitis C blood test. Hepatitis B blood test. Sexually transmitted disease (STD) testing. Diabetes screening. This is done by checking your blood sugar (glucose) after you have not eaten for a while (fasting). You may have this done every 1-3 years. Abdominal aortic aneurysm (AAA) screening. You may need this if you are a current or former smoker. Osteoporosis. You may be screened starting at age 74 if you are at high risk. Talk with your health care provider about your test results, treatment options, and if necessary, the need for more tests. Vaccines  Your health care provider may recommend certain vaccines, such as: Influenza vaccine. This is recommended every year. Tetanus, diphtheria, and acellular pertussis (Tdap, Td) vaccine. You may need a Td booster every 10 years. Zoster vaccine. You may need this after age 69. Pneumococcal 13-valent  conjugate (PCV13) vaccine. One dose is recommended after age 11. Pneumococcal polysaccharide (PPSV23) vaccine. One dose is recommended after age 31. Talk to your health care provider about which screenings and vaccines you need and how often you need them. This information is not intended  to replace advice given to you by your health care provider. Make sure you discuss any questions you have with your health care provider. Document Released: 03/14/2015 Document Revised: 11/05/2015 Document Reviewed: 12/17/2014 Elsevier Interactive Patient Education  2017 [employer].  Fall Prevention in the Home Falls can cause injuries. They can happen to people of all ages. There are many things you can do to make your home safe and to help prevent falls. What can I do on the outside of my home? Regularly fix the edges of walkways and driveways and fix any cracks. Remove anything that might make you trip as you walk through a door, such as a raised step or threshold. Trim any bushes or trees on the path to your home. Use bright outdoor lighting. Clear any walking paths of anything that might make someone trip, such as rocks or tools. Regularly check to see if handrails are loose or broken. Make sure that both sides of any steps have handrails. Any raised decks and porches should have guardrails on the edges. Have any leaves, snow, or ice cleared regularly. Use sand or salt on walking paths during winter. Clean up any spills in your garage right away. This includes oil or grease spills. What can I do in the bathroom? Use night lights. Install grab bars by the toilet and in the tub and shower. Do not use towel bars as grab bars. Use non-skid mats or decals in the tub or shower. If you need to sit down in the shower, use a plastic, non-slip stool. Keep the floor dry. Clean up any water that spills on the floor as soon as it happens. Remove soap buildup in the tub or shower regularly. Attach bath mats securely with double-sided non-slip rug tape. Do not have throw rugs and other things on the floor that can make you trip. What can I do in the bedroom? Use night lights. Make sure that you have a light by your bed that is easy to reach. Do not use any sheets or blankets that are too big  for your bed. They should not hang down onto the floor. Have a firm chair that has side arms. You can use this for support while you get dressed. Do not have throw rugs and other things on the floor that can make you trip. What can I do in the kitchen? Clean up any spills right away. Avoid walking on wet floors. Keep items that you use a lot in easy-to-reach places. If you need to reach something above you, use a strong step stool that has a grab bar. Keep electrical cords out of the way. Do not use floor polish or wax that makes floors slippery. If you must use wax, use non-skid floor wax. Do not have throw rugs and other things on the floor that can make you trip. What can I do with my stairs? Do not leave any items on the stairs. Make sure that there are handrails on both sides of the stairs and use them. Fix handrails that are broken or loose. Make sure that handrails are as long as the stairways. Check any carpeting to make sure that it is firmly attached to the stairs. Fix any carpet that  is loose or worn. Avoid having throw rugs at the top or bottom of the stairs. If you do have throw rugs, attach them to the floor with carpet tape. Make sure that you have a light switch at the top of the stairs and the bottom of the stairs. If you do not have them, ask someone to add them for you. What else can I do to help prevent falls? Wear shoes that: Do not have high heels. Have rubber bottoms. Are comfortable and fit you well. Are closed at the toe. Do not wear sandals. If you use a stepladder: Make sure that it is fully opened. Do not climb a closed stepladder. Make sure that both sides of the stepladder are locked into place. Ask someone to hold it for you, if possible. Clearly mark and make sure that you can see: Any grab bars or handrails. First and last steps. Where the edge of each step is. Use tools that help you move around (mobility aids) if they are needed. These  include: Canes. Walkers. Scooters. Crutches. Turn on the lights when you go into a dark area. Replace any light bulbs as soon as they burn out. Set up your furniture so you have a clear path. Avoid moving your furniture around. If any of your floors are uneven, fix them. If there are any pets around you, be aware of where they are. Review your medicines with your doctor. Some medicines can make you feel dizzy. This can increase your chance of falling. Ask your doctor what other things that you can do to help prevent falls. This information is not intended to replace advice given to you by your health care provider. Make sure you discuss any questions you have with your health care provider. Document Released: 12/12/2008 Document Revised: 07/24/2015 Document Reviewed: 03/22/2014 Elsevier Interactive Patient Education  2017 [employer].

## 2022-12-01 NOTE — Progress Notes (Signed)
Subjective:   Mark Mejia is a 67 y.o. male who presents for Medicare Annual/Subsequent preventive examination.  This wellness visit is conducted by a nurse.  The patient's medications were reviewed and reconciled since the patient's last visit.  History details were provided by the patient.  The history appears to be reliable.    Medical History: Patient history and Family history was reviewed  Medications, Allergies, and preventative health maintenance was reviewed and updated.  Visit Complete: In person  Patient Medicare AWV questionnaire was completed by the patient on 11/28/22; I have confirmed that all information answered by patient is correct and no changes since this date.        Objective:    Today's Vitals   12/01/22 1000  BP: 122/64  Pulse: 61  Resp: 16  SpO2: 96%  Weight: 211 lb 3.2 oz (95.8 kg)  PainSc: 0-No pain   Body mass index is 30.3 kg/m.     10/01/2022    3:35 PM 09/17/2022    4:21 PM 11/20/2021    9:41 AM 09/03/2021   12:56 PM 02/17/2021   11:16 AM 01/29/2021    7:57 PM 01/01/2020    3:21 PM  Advanced Directives  Does Patient Have a Medical Advance Directive? Yes No Yes Yes Yes Yes Yes  Type of Advance Directive Living will   Healthcare Power of [place];Living will Healthcare Power of [place];Living will Healthcare Power of [place];Living will Living will  Does patient want to make changes to medical advance directive? No - Patient declined  No - Patient declined   No - Patient declined Yes (Inpatient - patient defers changing a medical advance directive and declines information at this time)  Copy of Healthcare Power of Attorney in Chart?      Yes - validated most recent copy scanned in chart (See row information)   Would patient like information on creating a medical advance directive? No - Guardian declined          Current Medications (verified) Outpatient Encounter Medications as of 12/01/2022  Medication Sig   amLODipine (NORVASC) 5 MG tablet  TAKE 1 TABLET EVERY DAY   aspirin EC 81 MG tablet Take 81 mg by mouth daily.    dorzolamide-timolol (COSOPT) 22.3-6.8 MG/ML ophthalmic solution Place 1 drop into the right eye 2 (two) times daily.   eszopiclone (LUNESTA) 2 MG TABS tablet TAKE 1 TABLET IMMEDIATELY BEFORE BEDTIME AS NEEDED FOR SLEEP   famotidine (PEPCID) 40 MG tablet Take 40 mg by mouth daily.   Glycerin-Hypromellose-PEG 400 ([facility] DRY EYE RELIEF OP) Place 2 drops into both eyes daily as needed (for dry eyes).   HYDROcodone-acetaminophen (NORCO/VICODIN) 5-325 MG tablet Take 1 tablet by mouth every 6 (six) hours as needed for moderate pain.   ketoconazole (NIZORAL) 2 % cream Apply 1 Application topically daily.   latanoprost (XALATAN) 0.005 % ophthalmic solution Place 1 drop into the right eye at bedtime.   levothyroxine (SYNTHROID) 88 MCG tablet TAKE 1 TABLET EVERY DAY BEFORE BREAKFAST   meloxicam (MOBIC) 7.5 MG tablet TAKE 1 TABLET TWICE DAILY   metoprolol tartrate (LOPRESSOR) 25 MG tablet TAKE 1 TABLET TWICE DAILY   nitroGLYCERIN (NITROSTAT) 0.4 MG SL tablet Place 1 tablet (0.4 mg total) under the tongue every 5 (five) minutes as needed for chest pain.   omeprazole (PRILOSEC) 40 MG capsule Take 1 capsule (40 mg total) by mouth daily.   rosuvastatin (CRESTOR) 20 MG tablet TAKE 1 TABLET EVERY DAY   sildenafil (VIAGRA)  100 MG tablet TAKE 1/2 TO 1 TABLET EVERY DAY 30-60 MIN PRIOR TO INTERCOURSE AS NEEDED FOR ERECTILE DYSFUNCTION AS DIRECTED   tamsulosin (FLOMAX) 0.4 MG CAPS capsule TAKE 2 CAPSULES EVERY DAY   Testosterone Enanthate (XYOSTED) 50 MG/0.5ML SOAJ Inject 0.5 mLs into the skin once a week.   tiZANidine (ZANAFLEX) 4 MG tablet TAKE 1 TABLET EVERY 6 HOURS AS NEEDED FOR MUSCLE SPASM(S)   Vitamin D, Ergocalciferol, (DRISDOL) 1.25 MG (50000 UNIT) CAPS capsule TAKE 1 CAPSULE EVERY SUNDAY.   [DISCONTINUED] ciclopirox (PENLAC) 8 % solution Apply topically at bedtime. Apply over nail and surrounding skin. Apply daily over previous  coat. After seven (7) days, may remove with alcohol and continue cycle.   Facility-Administered Encounter Medications as of 12/01/2022  Medication   testosterone cypionate (DEPOTESTOSTERONE CYPIONATE) injection 200 mg    Allergies (verified) Patient has no known allergies.   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   Hypertrophic scar of skin 09/29/2016   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   Mixed hyperlipidemia    Peripheral vascular disease, asymptomatic (HCC) 05/23/2017   80-99% stenosis in the left internal coronary artery   Precordial chest pain 12/26/2015   Shortness of breath 12/26/2015   Testicular hypofunction    Vitamin D deficiency    Past Surgical History:  Procedure Laterality Date   GASTROSTOMY W/ FEEDING TUBE     LEFT HEART CATH AND CORONARY ANGIOGRAPHY N/A 05/25/2017   Procedure: LEFT HEART CATH AND CORONARY ANGIOGRAPHY;  Surgeon: [place], Peter M, MD;  Location: [facility];  Service: Cardiovascular;  Laterality: N/A;   PORTACATH PLACEMENT     portacath removal     THROAT SURGERY     Family History  Problem Relation Age of Onset   Arthritis Mother    Hypertension Mother    Hypertension Brother    Cancer - Other Maternal Grandmother    Social History   Socioeconomic History   Marital status: Married    Spouse name: LAURA   Number of children: 1    Years of education: 12 + 2   Highest education level: Not on file  Occupational History   Occupation: Retired  Tobacco Use   Smoking status: Never   Smokeless tobacco: Never  Vaping Use   Vaping status: Never Used  Substance and Sexual Activity   Alcohol use: No   Drug use: No   Sexual activity: Yes  Other Topics Concern   Not on file  Social History Narrative   Currently retired/disabled   Social Determinants of Health   Financial Resource Strain: Patient Declined (11/28/2022)   Overall Financial Resource Strain (CARDIA)    Difficulty of Paying Living Expenses: Patient declined  Food Insecurity: No Food Insecurity (11/28/2022)   Hunger Vital Sign    Worried About Running Out of Food in the Last Year: Never true    Ran  Out of Food in the Last Year: Never true  Transportation Needs: No Transportation Needs (11/28/2022)   PRAPARE - Administrator, Civil Service (Medical): No    Lack of Transportation (Non-Medical): No  Physical Activity: Insufficiently Active (11/28/2022)   Exercise Vital Sign    Days of Exercise per Week: 3 days    Minutes of Exercise per Session: 10 min  Stress: No Stress Concern Present (11/28/2022)   [employer] of Occupational Health - Occupational Stress Questionnaire    Feeling of Stress : Not at all  Social Connections: Unknown (11/28/2022)   Social Connection and Isolation Panel [NHANES]    Frequency of Communication with Friends and Family: Once a week    Frequency of Social Gatherings with Friends and Family: Once a week    Attends Religious Services: Patient declined    Database administrator or Organizations: Yes    Attends Engineer, structural: More than 4 times per year    Marital Status: Married    Tobacco Counseling Counseling given: Not Answered   Clinical Intake:  Pre-visit preparation completed: Yes Pain : No/denies pain Pain Score: 0-No pain   BMI - recorded: 30.3 Nutritional Status: BMI > 30   Obese Nutritional Risks: None Diabetes: No How often do you need to have someone help you when you read instructions, pamphlets, or other written materials from your doctor or pharmacy?: 2 - Rarely Interpreter Needed?: No    Activities of Daily Living    11/28/2022    9:21 PM 05/06/2022   11:25 AM  In your present state of health, do you have any difficulty performing the following activities:  Hearing? 0 0  Vision? 0 0  Difficulty concentrating or making decisions? 0 0  Walking or climbing stairs? 1 0  Dressing or bathing? 0 0  Doing errands, shopping? 0 0  Preparing Food and eating ? N   Using the Toilet? N   In the past six months, have you accidently leaked urine? N   Do you have problems with loss of bowel control? N   Managing your Medications? N   Managing your Finances? N   Housekeeping or managing your Housekeeping? N     Patient Care Team: [name], MD as PCP - General (Internal Medicine) Georgeanna Lea, MD as Consulting Physician (Cardiology) Misenheimer, Marcial Pacas, MD as Consulting Physician (Unknown Physician Specialty)  Indicate any recent Medical Services you may have received from other than [facility] providers in the past year (date may be approximate).     Assessment:   This is a routine wellness examination for [place].  Hearing/Vision screen No results found.  Depression Screen    12/01/2022    3:30 PM 10/18/2022   10:06 AM 05/06/2022   11:25 AM 02/16/2022    3:11 PM 11/20/2021    9:42 AM 02/17/2021   11:15 AM 01/02/2021    9:04 AM  PHQ 2/9 Scores  PHQ - 2 Score 0 0 0 0 0 0 0  PHQ- 9 Score 2 3         Fall Risk    11/28/2022    9:21 PM 10/18/2022   10:06 AM 05/06/2022   11:25 AM 02/16/2022    3:11 PM 11/20/2021    9:41 AM  Fall Risk   Falls in the past year? 0 0 0 0 0  Number falls in past yr: 0 0 0 0 0  Injury with Fall? 0 0 0 0 0  Risk for fall due to : No Fall Risks No Fall Risks No Fall Risks No Fall Risks No Fall Risks  Follow up Falls  evaluation completed;Education provided Falls evaluation completed;Falls prevention discussed Falls evaluation completed Falls evaluation completed Falls evaluation completed;Falls prevention discussed    MEDICARE RISK AT HOME: Medicare Risk at Home Any stairs in or around the home?: Yes If so, are there any without handrails?: No Home free of loose throw rugs in walkways, pet beds, electrical cords, etc?: Yes Adequate lighting in your home to reduce risk of falls?: Yes Life alert?: No Use of a cane, walker or w/c?: Yes Grab bars in the bathroom?: Yes Shower chair or bench in shower?: No Elevated toilet seat or a handicapped toilet?: Yes  TIMED UP AND GO:  Was the test performed?  Yes  Length of time to ambulate 10 feet: 6 sec Gait steady and fast without use of assistive device    Cognitive Function:    08/04/2021    9:35 AM  MMSE - Mini Mental State Exam  Orientation to time 4  Orientation to Place 5  Registration 3  Attention/ Calculation 5  Recall 3  Language- name 2 objects 2  Language- repeat 1  Language- follow 3 step command 3  Language- read & follow direction 1  Write a sentence 1  Copy design 1  Total score 29        11/20/2021    9:45 AM 01/01/2020    3:18 PM  6CIT Screen  What Year? 0 points 0 points  What month? 0 points 3 points  What time? 0 points 0 points  Count back from 20 0 points 0 points  Months in reverse 2 points 0 points  Repeat phrase 0 points 0 points  Total Score 2 points 3 points    Immunizations Immunization History  Administered Date(s) Administered   Fluad Quad(high Dose 65+) 12/14/2019, 11/26/2020, 11/09/2021   Fluad Trivalent(High Dose 65+) 12/01/2022   Influenza-Unspecified 12/01/2018   Moderna Covid-19 Vaccine Bivalent Booster 32yrs & up 11/26/2020   Moderna Sars-Covid-2 Vaccination 04/27/2019, 05/30/2019, 01/18/2020   PNEUMOCOCCAL CONJUGATE-20 02/16/2022   Pfizer(Comirnaty)Fall Seasonal Vaccine 12 years and older  02/16/2022, 12/01/2022   Tdap 05/31/2011    TDAP status: Due, Education has been provided regarding the importance of this vaccine. Advised may receive this vaccine at local pharmacy or Health Dept. Aware to provide a copy of the vaccination record if obtained from local pharmacy or Health Dept. Verbalized acceptance and understanding.  Flu Vaccine status: Completed at today's visit  Pneumococcal vaccine status: Up to date  Covid-19 vaccine status: Completed vaccines  Qualifies for Shingles Vaccine? Yes   Zostavax completed No   Shingrix Completed?: Yes  Screening Tests Health Maintenance  Topic Date Due   Zoster Vaccines- Shingrix (1 of 2) Never done   DTaP/Tdap/Td (2 - Td or Tdap) 05/30/2021   Medicare Annual Wellness (AWV)  11/18/2022   Colonoscopy  01/14/2023   COVID-19 Vaccine (7 - 2023-24 season) 01/26/2023   Pneumonia Vaccine 76+ Years old  Completed   INFLUENZA VACCINE  Completed   Hepatitis C Screening  Completed   HPV VACCINES  Aged Out    Health Maintenance  Health Maintenance Due  Topic Date Due   Zoster Vaccines- Shingrix (1 of 2) Never done   DTaP/Tdap/Td (2 - Td or Tdap) 05/30/2021   Medicare Annual Wellness (AWV)  11/18/2022   Colonoscopy  01/14/2023    Colorectal cancer screening: Type of screening:  Colonoscopy. Completed 12/2017. Repeat every 5 years  Lung Cancer Screening: (Low Dose CT Chest recommended if Age 74-80 years, 20 pack-year currently smoking OR have quit w/in 15years.) does not qualify.    Additional Screening:  Vision Screening: Recommended annual ophthalmology exams for early detection of glaucoma and other disorders of the eye. Is the patient up to date with their annual eye exam?  Yes   Dental Screening: Recommended annual dental exams for proper oral hygiene  Community Resource Referral / Chronic Care Management: CRR required this visit?  No   CCM required this visit?  No     Plan:    I have personally reviewed and noted  the following in the patient's chart:   Medical and social history Use of alcohol, tobacco or illicit drugs  Current medications and supplements including opioid prescriptions. Patient is not currently taking opioid prescriptions. Functional ability and status Nutritional status Physical activity Advanced directives List of other physicians Hospitalizations, surgeries, and ER visits in previous 12 months Vitals Screenings to include cognitive, depression, and falls Referrals and appointments  In addition, I have reviewed and discussed with patient certain preventive protocols, quality metrics, and best practice recommendations. A written personalized care plan for preventive services as well as general preventive health recommendations were provided to patient.     Jacklynn Bue, LPN   [id number]   After Visit Summary: (MyChart) Due to this being a telephonic visit, the after visit summary with patients personalized plan was offered to patient via MyChart

## 2022-12-03 ENCOUNTER — Other Ambulatory Visit: Payer: Self-pay | Admitting: Physician Assistant

## 2022-12-06 ENCOUNTER — Ambulatory Visit: Payer: Medicare PPO | Admitting: Podiatry

## 2022-12-06 DIAGNOSIS — H4031X3 Glaucoma secondary to eye trauma, right eye, severe stage: Secondary | ICD-10-CM | POA: Diagnosis not present

## 2022-12-15 ENCOUNTER — Other Ambulatory Visit: Payer: Self-pay

## 2022-12-15 MED ORDER — OMEPRAZOLE 40 MG PO CPDR: 40.0000 mg | DELAYED_RELEASE_CAPSULE | Freq: Every day | ORAL | 1 refills | Status: DC

## 2022-12-20 ENCOUNTER — Ambulatory Visit (INDEPENDENT_AMBULATORY_CARE_PROVIDER_SITE_OTHER): Payer: Medicare PPO | Admitting: Podiatry

## 2022-12-20 DIAGNOSIS — Z91199 Patient's noncompliance with other medical treatment and regimen due to unspecified reason: Secondary | ICD-10-CM

## 2022-12-20 NOTE — Progress Notes (Signed)
 Patient absent for apointment

## 2022-12-27 DIAGNOSIS — H2513 Age-related nuclear cataract, bilateral: Secondary | ICD-10-CM | POA: Diagnosis not present

## 2022-12-27 DIAGNOSIS — I10 Essential (primary) hypertension: Secondary | ICD-10-CM | POA: Diagnosis not present

## 2022-12-27 DIAGNOSIS — H02831 Dermatochalasis of right upper eyelid: Secondary | ICD-10-CM | POA: Diagnosis not present

## 2022-12-27 DIAGNOSIS — H4031X3 Glaucoma secondary to eye trauma, right eye, severe stage: Secondary | ICD-10-CM | POA: Diagnosis not present

## 2022-12-27 DIAGNOSIS — H2511 Age-related nuclear cataract, right eye: Secondary | ICD-10-CM | POA: Diagnosis not present

## 2022-12-28 ENCOUNTER — Encounter: Payer: Self-pay | Admitting: Podiatry

## 2022-12-28 ENCOUNTER — Ambulatory Visit: Payer: Medicare PPO | Admitting: Podiatry

## 2022-12-28 DIAGNOSIS — B353 Tinea pedis: Secondary | ICD-10-CM | POA: Diagnosis not present

## 2022-12-28 DIAGNOSIS — M79674 Pain in right toe(s): Secondary | ICD-10-CM | POA: Diagnosis not present

## 2022-12-28 DIAGNOSIS — M79675 Pain in left toe(s): Secondary | ICD-10-CM | POA: Diagnosis not present

## 2022-12-28 DIAGNOSIS — B351 Tinea unguium: Secondary | ICD-10-CM | POA: Diagnosis not present

## 2022-12-28 MED ORDER — CICLOPIROX 8 % EX SOLN: Freq: Every day | CUTANEOUS | 0 refills | Status: DC

## 2022-12-28 MED ORDER — KETOCONAZOLE 2 % EX CREA: 1.0000 | TOPICAL_CREAM | Freq: Every day | CUTANEOUS | 0 refills | Status: AC

## 2022-12-28 NOTE — Progress Notes (Signed)
Subjective:  Patient ID: Mark Mejia, male    DOB: 05-19-1955,  MRN: [id number]  Chief Complaint  Patient presents with   Nail Problem    Onycho b/l great toenails improving some with penlac. Tinea pedis improved somewhat with toenails.    67 y.o. male presents for follow-up on bilateral hallux dystrophy.  Being treated with Penlac topical antifungal.  Also has concern for prior tinea pedis improved after ketoconazole use.  No itching or rash present.  I also having difficulty trimming his nails due to their thickness and dystrophy  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  Itching is noted.  Patient does have yellowing discoloration thickening dystrophy and onycholysis with subungual debris present in the bilateral hallux nail.  Nails elongated thickened and  dystrophic x 5 bilateral foot  Vascular:  Dorsalis Pedis artery and Posterior Tibial artery pedal pulses are 2/4 bilateral.  Capillary fill time < 3 sec to all digits.   Neruologic: Grossly intact via light touch bilateral. Protective threshold intact to all sites bilateral.   Musculoskeletal: No gross boney pedal deformities bilateral. No pain, crepitus, or limitation noted with foot and ankle range of motion bilateral. Muscular strength 5/5 in all groups tested bilateral.  Gait: Unassisted, Nonantalgic.   No images are attached to the encounter.  Assessment:   1. Pain due to onychomycosis of toenails of both feet   2. Tinea pedis of both feet       Plan:  Patient was evaluated and treated and all questions answered.  # Tinea pedis bilateral foot Discussed the etiology and treatment options for tinea pedis.  Discussed topical and oral treatment.  Recommended topical treatment with 2% ketoconazole cream.  This was sent to the patient's pharmacy.  Also discussed appropriate foot hygiene, use of antifungal spray such as Tinactin in shoes, as well as cleaning her foot surfaces such as showers and bathroom floors with bleach. -I certify that this diagnosis represents a distinct and separate diagnosis that requires evaluation and treatment separate from other procedures or diagnosis   # Onychomycosis bilateral hallux nail Onychomycosis -Educated on etiology of nail fungus. -Discussed treatment options with patient including topical and oral antifungal. -Patient wishes to defer oral antifungal due to risk of liver toxicity -eRx for Penlac 8% topical solution applied daily at night to all affected  nails for the next 6 months.  #Onychomycosis with pain  -Nails palliatively debrided as below. -Educated on self-care  Procedure: Nail Debridement Rationale: Pain Type of Debridement: manual, sharp debridement. Instrumentation: Nail nipper, rotary burr. Number of Nails: 10   Return in  about 3 months (around 03/30/2023) for RFC.          Corinna Gab, DPM [facility] / [facility]

## 2023-01-17 ENCOUNTER — Ambulatory Visit: Payer: Medicare PPO | Admitting: Family Medicine

## 2023-01-17 ENCOUNTER — Ambulatory Visit (INDEPENDENT_AMBULATORY_CARE_PROVIDER_SITE_OTHER): Payer: Medicare PPO

## 2023-01-17 DIAGNOSIS — E291 Testicular hypofunction: Secondary | ICD-10-CM | POA: Diagnosis not present

## 2023-01-17 MED ORDER — TESTOSTERONE CYPIONATE 200 MG/ML IM SOLN
200.0000 mg | Freq: Once | INTRAMUSCULAR | Status: AC
Start: 2023-01-17 — End: 2023-01-17
  Administered 2023-01-17: 200 mg via INTRAMUSCULAR

## 2023-01-26 ENCOUNTER — Other Ambulatory Visit: Payer: Self-pay | Admitting: Family Medicine

## 2023-01-31 ENCOUNTER — Ambulatory Visit: Payer: Medicare PPO

## 2023-02-04 ENCOUNTER — Ambulatory Visit (INDEPENDENT_AMBULATORY_CARE_PROVIDER_SITE_OTHER): Payer: Medicare PPO

## 2023-02-04 ENCOUNTER — Ambulatory Visit: Payer: Self-pay | Admitting: Family Medicine

## 2023-02-04 VITALS — BP 124/62 | HR 68 | Temp 99.0°F | Resp 18 | Ht 70.0 in | Wt 221.8 lb

## 2023-02-04 DIAGNOSIS — Z125 Encounter for screening for malignant neoplasm of prostate: Secondary | ICD-10-CM

## 2023-02-04 DIAGNOSIS — I9589 Other hypotension: Secondary | ICD-10-CM

## 2023-02-04 DIAGNOSIS — R509 Fever, unspecified: Secondary | ICD-10-CM | POA: Diagnosis not present

## 2023-02-04 LAB — POCT URINALYSIS DIP (CLINITEK)
Bilirubin, UA: NEGATIVE
Glucose, UA: NEGATIVE mg/dL
Ketones, POC UA: NEGATIVE mg/dL
Leukocytes, UA: NEGATIVE
Nitrite, UA: NEGATIVE
POC PROTEIN,UA: NEGATIVE
Spec Grav, UA: 1.01 (ref 1.010–1.025)
Urobilinogen, UA: 0.2 U/dL
pH, UA: 6 (ref 5.0–8.0)

## 2023-02-04 NOTE — Telephone Encounter (Signed)
Copied from CRM [id number]. Topic: Clinical - Red Word Triage >> Feb 04, 2023  8:27 AM Fonda Kinder J wrote: Red Word that prompted transfer to Nurse Triage: Low Blood pressure & Fever.  Chief Complaint:  Lowest  Blood Pressure          78/54 945 pm  Current Blood Pressure   140/80   Current Temp 101.9  101.00 Symptoms:  Started  with Night Sweats a week ago  Frequency:    Pertinent Negatives: Patient denies SOB. Disposition: [] ED /[x] Urgent Care (no appt availability in office) / [x] Appointment(In office/virtual)/ []  [facility]/ [] Home Care/ [] Refused Recommended Disposition /[] [facility]/ []  Follow-up with PCP Additional Notes: Cancer Surviir , Caroid artery- blockogly, Hypothyrid, hypertensis Reason for Disposition  [1] Fever > 101 F (38.3 C) AND [2] age > 60 years  Answer Assessment - Initial Assessment Questions 1. TEMPERATURE: "What is the most recent temperature?"  "How was it measured?"      101.9 2. ONSET: "When did the fever start?"   This morning at 7am      3. CHILLS: "Do you have chills?" If yes: "How bad are they?"  (e.g., none,    - MODERATE: feeling very cold, some shivering (feels better under a thick blanket)        4. OTHER SYMPTOMS: "Do you have any other symptoms besides the fever?"  (e.g., abdomen pain, cough, diarrhea, earache, headache, sore throat, urination pain)      Denies all  of the above. 5. CAUSE: If there are no symptoms, ask: "What do you think is causing the fever?"       No idea  and no clue . 6. CONTACTS: "Does anyone else in the family have an infection?"      Only wife had stomach bug last week. Son has a lingering Pneumonia- completed antibiotic  on Completed on Tuesday. 7. Treatment: "What have you done so far to treat this fever?" Tylenol at  650 mg at 0700 am      8. IMMUNOCOMPROMISE: "Do you have of the following: diabetes, HIV positive, splenectomy, cancer chemotherapy, chronic steroid treatment, transplant patient, etc."  Not of Recent       10. TRAVEL: "Have you traveled out of the country in the last month?" (e.g., travel history, exposures)       No.  Protocols used: [facility]

## 2023-02-04 NOTE — Progress Notes (Unsigned)
Acute Office Visit  Subjective:    Patient ID: Mark Mejia, male    DOB: 05-Nov-1955, 67 y.o.   MRN: [id number]  Chief Complaint  Patient presents with  . Fever  . low bp    HPI: Patient is in today for ***  Past Medical History:  Diagnosis Date  . Angina pectoris (HCC) 05/25/2017  . Atrophy of thyroid   . Benign prostatic hyperplasia with nocturia 06/14/2019  . CAD (coronary artery disease)   . Cancer of cervical esophagus (HCC) 12/06/2012  . Chest pain of uncertain etiology 05/25/2017  . Encounter for adjustment and management of vascular access device 03/10/2016  . Encounter for annual wellness exam in Medicare patient 03/10/2016  . GERD (gastroesophageal reflux disease)   . Hepatitis C   . History of esophageal cancer   . 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 Hypertrophic scar of skin 09/29/2016  . 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  . Mixed hyperlipidemia   . Peripheral vascular disease, asymptomatic (HCC) 05/23/2017   80-99% stenosis in the left internal coronary artery  . Precordial chest pain 12/26/2015  . Shortness of breath 12/26/2015  . Testicular hypofunction   . Vitamin D deficiency     Past Surgical History:  Procedure Laterality Date  . GASTROSTOMY W/ FEEDING TUBE    . LEFT HEART CATH AND CORONARY ANGIOGRAPHY N/A 05/25/2017   Procedure: LEFT HEART CATH AND CORONARY ANGIOGRAPHY;  Surgeon: [place], Peter M, MD;  Location: [facility] INVASIVE CV LAB;  Service: Cardiovascular;  Laterality: N/A;  . PORTACATH PLACEMENT    . portacath removal    . THROAT SURGERY      Family History  Problem Relation Age of Onset  . Arthritis Mother   . Hypertension Mother   . Hypertension Brother   . Cancer - Other Maternal Grandmother     Social History   Socioeconomic History  . Marital status: Married    Spouse name: LAURA  . Number of children: 1  . Years of education: 76 + 2  .  Highest education level: Not on file  Occupational History  . Occupation: Retired  Tobacco Use  . Smoking status: Never  . Smokeless tobacco: Never  Vaping Use  . Vaping status: Never Used  Substance and Sexual Activity  . Alcohol use: No  . Drug use: No  . Sexual activity: Yes  Other Topics Concern  . Not on file  Social History Narrative   Currently retired/disabled   Social Determinants of Health   Financial Resource Strain: Patient Declined (02/04/2023)   Overall Financial Resource Strain (CARDIA)   . Difficulty of Paying Living Expenses: Patient declined  Food Insecurity: Patient Declined (02/04/2023)   Hunger Vital Sign   . Worried About Programme researcher, broadcasting/film/video in the Last Year: Patient declined   . Ran Out of Food in the Last Year: Patient declined  Transportation Needs: Patient Declined (02/04/2023)   PRAPARE - Transportation   . Lack of Transportation (Medical): Patient declined   . Lack of Transportation (Non-Medical): Patient declined  Physical Activity: Unknown (02/04/2023)   Exercise Vital Sign   . Days of Exercise per Week: Patient declined   . Minutes of Exercise per Session: 10 min  Recent Concern: Physical Activity - Insufficiently Active (11/28/2022)   Exercise Vital Sign   . Days of Exercise per Week: 3 days   . Minutes of Exercise per Session: 10  min  Stress: Patient Declined (02/04/2023)   [employer] of Occupational Health - Occupational Stress Questionnaire   . Feeling of Stress : Patient declined  Social Connections: Unknown (02/04/2023)   Social Connection and Isolation Panel [NHANES]   . Frequency of Communication with Friends and Family: Patient declined   . Frequency of Social Gatherings with Friends and Family: Patient declined   . Attends Religious Services: Patient declined   . Active Member of Clubs or Organizations: Patient declined   . Attends Banker Meetings: More than 4 times per year   . Marital Status: Patient declined   Intimate Partner Violence: Not At Risk (12/01/2022)   Humiliation, Afraid, Rape, and Kick questionnaire   . Fear of Current or Ex-Partner: No   . Emotionally Abused: No   . Physically Abused: No   . Sexually Abused: No    Outpatient Medications Prior to Visit  Medication Sig Dispense Refill  . amLODipine (NORVASC) 5 MG tablet TAKE 1 TABLET EVERY DAY 90 tablet 3  . aspirin EC 81 MG tablet Take 81 mg by mouth daily.     . ciclopirox (PENLAC) 8 % solution Apply topically at bedtime. Apply over nail and surrounding skin. Apply daily over previous coat. After seven (7) days, may remove with alcohol and continue cycle. 6.6 mL 0  . dorzolamide-timolol (COSOPT) 22.3-6.8 MG/ML ophthalmic solution Place 1 drop into the right eye 2 (two) times daily.    . eszopiclone (LUNESTA) 2 MG TABS tablet TAKE 1 TABLET IMMEDIATELY BEFORE BEDTIME AS NEEDED FOR SLEEP 30 tablet 5  . famotidine (PEPCID) 40 MG tablet Take 40 mg by mouth daily.    . Glycerin-Hypromellose-PEG 400 ([facility] DRY EYE RELIEF OP) Place 2 drops into both eyes daily as needed (for dry eyes).    Marland Kitchen HYDROcodone-acetaminophen (NORCO/VICODIN) 5-325 MG tablet Take 1 tablet by mouth every 6 (six) hours as needed for moderate pain. 30 tablet 0  . ketoconazole (NIZORAL) 2 % cream Apply 1 Application topically daily. 30 g 0  . latanoprost (XALATAN) 0.005 % ophthalmic solution Place 1 drop into the right eye at bedtime.    Marland Kitchen levothyroxine (SYNTHROID) 88 MCG tablet TAKE 1 TABLET EVERY DAY BEFORE BREAKFAST 90 tablet 3  . meloxicam (MOBIC) 7.5 MG tablet TAKE 1 TABLET TWICE DAILY 180 tablet 3  . metoprolol tartrate (LOPRESSOR) 25 MG tablet TAKE 1 TABLET TWICE DAILY 180 tablet 3  . nitroGLYCERIN (NITROSTAT) 0.4 MG SL tablet Place 1 tablet (0.4 mg total) under the tongue every 5 (five) minutes as needed for chest pain. 25 tablet 1  . omeprazole (PRILOSEC) 40 MG capsule Take 1 capsule (40 mg total) by mouth daily. 1rst attempt, patient needs and appt in February for  additional refills 90 capsule 1  . rosuvastatin (CRESTOR) 20 MG tablet TAKE 1 TABLET EVERY DAY 90 tablet 3  . sildenafil (VIAGRA) 100 MG tablet TAKE 1/2 TO 1 TABLET EVERY DAY 30-60 MIN PRIOR TO INTERCOURSE AS NEEDED FOR ERECTILE DYSFUNCTION AS DIRECTED 6 tablet 11  . tamsulosin (FLOMAX) 0.4 MG CAPS capsule TAKE 2 CAPSULES EVERY DAY 180 capsule 3  . Testosterone Enanthate (XYOSTED) 50 MG/0.5ML SOAJ Inject 0.5 mLs into the skin once a week. 2 mL 3  . tiZANidine (ZANAFLEX) 4 MG tablet TAKE 1 TABLET EVERY 6 HOURS AS NEEDED FOR MUSCLE SPASM(S) 90 tablet 5  . Vitamin D, Ergocalciferol, (DRISDOL) 1.25 MG (50000 UNIT) CAPS capsule TAKE 1 CAPSULE EVERY SUNDAY. 12 capsule 3  . ketoconazole (NIZORAL) 2 %  cream Apply 1 Application topically daily. 60 g 0   Facility-Administered Medications Prior to Visit  Medication Dose Route Frequency Provider Last Rate Last Admin  . testosterone cypionate (DEPOTESTOSTERONE CYPIONATE) injection 200 mg  200 mg Intramuscular Q14 Days Cox, Kirsten, MD   200 mg at 12/01/22 1544    No Known Allergies  Review of Systems  Constitutional:  Positive for activity change and fever.  HENT: Negative.    Eyes: Negative.   Respiratory: Negative.    Cardiovascular: Negative.   Gastrointestinal: Negative.   Endocrine: Negative.   Genitourinary:  Positive for frequency (chronic).  Musculoskeletal: Negative.   Skin: Negative.   Neurological: Negative.   Psychiatric/Behavioral: Negative.         Objective:        02/04/2023   11:20 AM 12/01/2022   10:00 AM 10/18/2022   10:03 AM  Vitals with BMI  Height 5\' 10"   5\' 10"   Weight 221 lbs 13 oz 211 lbs 3 oz 206 lbs 10 oz  BMI 31.83  29.64  Systolic 124 122 130  Diastolic 62 64 70  Pulse 68 61 60    Orthostatic VS for the past 72 hrs (Last 3 readings):  Patient Position BP Location Cuff Size  02/04/23 1120 Sitting Left Arm Large     Physical Exam  Health Maintenance Due  Topic Date Due  . Zoster Vaccines- Shingrix  (1 of 2) Never done  . DTaP/Tdap/Td (2 - Td or Tdap) 05/30/2021  . Colonoscopy  01/14/2023  . COVID-19 Vaccine (7 - 2023-24 season) 01/26/2023    There are no preventive care reminders to display for this patient.   Lab Results  Component Value Date   TSH 3.870 06/18/2022   Lab Results  Component Value Date   WBC 4.0 10/01/2022   HGB 13.6 10/01/2022   HCT 42.1 10/01/2022   MCV 79.1 (L) 10/01/2022   PLT 137 (L) 10/01/2022   Lab Results  Component Value Date   NA 133 (L) 10/01/2022   K 3.7 10/01/2022   CO2 22 10/01/2022   GLUCOSE 95 10/01/2022   BUN 12 10/01/2022   CREATININE 1.10 10/01/2022   BILITOT 0.7 10/01/2022   ALKPHOS 46 10/01/2022   AST 29 10/01/2022   ALT 24 10/01/2022   PROT 7.4 10/01/2022   ALBUMIN 3.9 10/01/2022   CALCIUM 8.8 (L) 10/01/2022   ANIONGAP 8 10/01/2022   EGFR  09/20/2022     Comment:     >60  - Abstracted by HIM   Lab Results  Component Value Date   CHOL 104 06/18/2022   Lab Results  Component Value Date   HDL 31 (L) 06/18/2022   Lab Results  Component Value Date   LDLCALC 55 06/18/2022   Lab Results  Component Value Date   TRIG 92 06/18/2022   Lab Results  Component Value Date   CHOLHDL 3.4 06/18/2022   No results found for: "HGBA1C"     Assessment & Plan:  There are no diagnoses linked to this encounter.   No orders of the defined types were placed in this encounter.   No orders of the defined types were placed in this encounter.    Follow-up: No follow-ups on file.  An After Visit Summary was printed and given to the patient.  Windell Moment, MD [facility] [phone]

## 2023-02-05 LAB — COMPREHENSIVE METABOLIC PANEL
ALT: 20 [IU]/L (ref 0–44)
AST: 32 [IU]/L (ref 0–40)
Albumin: 4.2 g/dL (ref 3.9–4.9)
Alkaline Phosphatase: 57 [IU]/L (ref 44–121)
BUN/Creatinine Ratio: 7 — ABNORMAL LOW (ref 10–24)
BUN: 9 mg/dL (ref 8–27)
Bilirubin Total: 0.9 mg/dL (ref 0.0–1.2)
CO2: 25 mmol/L (ref 20–29)
Calcium: 8.8 mg/dL (ref 8.6–10.2)
Chloride: 103 mmol/L (ref 96–106)
Creatinine, Ser: 1.33 mg/dL — ABNORMAL HIGH (ref 0.76–1.27)
Globulin, Total: 2.5 g/dL (ref 1.5–4.5)
Glucose: 90 mg/dL (ref 70–99)
Potassium: 3.9 mmol/L (ref 3.5–5.2)
Sodium: 139 mmol/L (ref 134–144)
Total Protein: 6.7 g/dL (ref 6.0–8.5)
eGFR: 58 mL/min/{1.73_m2} — ABNORMAL LOW (ref >59)

## 2023-02-05 LAB — CBC WITH DIFFERENTIAL/PLATELET
Basophils Absolute: 0 10*3/uL (ref 0.0–0.2)
Basos: 0 %
EOS (ABSOLUTE): 0.2 10*3/uL (ref 0.0–0.4)
Eos: 3 %
Hematocrit: 40.5 % (ref 37.5–51.0)
Hemoglobin: 12.7 g/dL — ABNORMAL LOW (ref 13.0–17.7)
Immature Grans (Abs): 0 10*3/uL (ref 0.0–0.1)
Immature Granulocytes: 0 %
Lymphocytes Absolute: 1.5 10*3/uL (ref 0.7–3.1)
Lymphs: 21 %
MCH: 27.2 pg (ref 26.6–33.0)
MCHC: 31.4 g/dL — ABNORMAL LOW (ref 31.5–35.7)
MCV: 87 fL (ref 79–97)
Monocytes Absolute: 0.5 10*3/uL (ref 0.1–0.9)
Monocytes: 7 %
Neutrophils Absolute: 5.1 10*3/uL (ref 1.4–7.0)
Neutrophils: 69 %
Platelets: 135 10*3/uL — ABNORMAL LOW (ref 150–450)
RBC: 4.67 x10E6/uL (ref 4.14–5.80)
RDW: 16.6 % — ABNORMAL HIGH (ref 11.6–15.4)
WBC: 7.4 10*3/uL (ref 3.4–10.8)

## 2023-02-05 LAB — SEDIMENTATION RATE: Sed Rate: 3 mm/h (ref 0–30)

## 2023-02-05 LAB — PSA, TOTAL AND FREE
PSA, Free Pct: 43.3 %
PSA, Free: 0.26 ng/mL
Prostate Specific Ag, Serum: 0.6 ng/mL (ref 0.0–4.0)

## 2023-02-05 LAB — C-REACTIVE PROTEIN: CRP: 9 mg/L (ref 0–10)

## 2023-02-07 ENCOUNTER — Other Ambulatory Visit: Payer: Self-pay

## 2023-02-07 DIAGNOSIS — K219 Gastro-esophageal reflux disease without esophagitis: Secondary | ICD-10-CM | POA: Diagnosis not present

## 2023-02-07 DIAGNOSIS — N289 Disorder of kidney and ureter, unspecified: Secondary | ICD-10-CM

## 2023-02-07 DIAGNOSIS — C159 Malignant neoplasm of esophagus, unspecified: Secondary | ICD-10-CM | POA: Diagnosis not present

## 2023-02-07 DIAGNOSIS — Z125 Encounter for screening for malignant neoplasm of prostate: Secondary | ICD-10-CM | POA: Insufficient documentation

## 2023-02-07 DIAGNOSIS — K222 Esophageal obstruction: Secondary | ICD-10-CM | POA: Diagnosis not present

## 2023-02-07 NOTE — Assessment & Plan Note (Addendum)
Fever up to 101.34F without associated symptoms. No clear source of infection identified on physical exam. Discussed need for blood work and urine test to identify potential infection. Emphasized importance of hydration. - Order CBC with differential - Order CMP - Order urine test - Stay well hydrated - Seek emergency care if symptoms worsen    Throat Cancer History of throat cancer with recent normal chest x-ray and blood work in August. No current symptoms suggestive of recurrence. - Continue routine follow-up and monitoring  General Health Maintenance No new issues identified. Previous blood work and imaging were normal. - Encourage adequate hydration  Follow-up - Perform blood work and urine test immediately - Seek emergency care if symptoms worsen over the weekend.

## 2023-02-07 NOTE — Assessment & Plan Note (Signed)
His wife requests to have a screening test for prostate cancer as a relative was reportedly diagnosed recently and [name] has been very anxious about it.  Ordered screening PSA

## 2023-02-07 NOTE — Assessment & Plan Note (Signed)
Intermittent episodes of hypotension, most recently recorded at 44 mmHg systolic and 99/50 mmHg. Associated with bradycardia (baseline in the 60s). Possible contributing factors include metoprolol and amlodipine. Discussed reducing metoprolol dosage and holding off on amlodipine until blood pressure stabilizes. - Hold off on amlodipine - Take half of metoprolol twice daily, he already only seems to be taking 25 mg ONCE daily. Advised to either take HALF TWICE DAILY or half once daily depending on his Bps and Heart rates - Monitor blood pressure and heart rate - Seek emergency care if hypotension persists

## 2023-02-26 ENCOUNTER — Other Ambulatory Visit: Payer: Self-pay | Admitting: Cardiology

## 2023-03-08 ENCOUNTER — Ambulatory Visit (INDEPENDENT_AMBULATORY_CARE_PROVIDER_SITE_OTHER): Payer: Medicare PPO

## 2023-03-08 DIAGNOSIS — N289 Disorder of kidney and ureter, unspecified: Secondary | ICD-10-CM | POA: Diagnosis not present

## 2023-03-08 DIAGNOSIS — E291 Testicular hypofunction: Secondary | ICD-10-CM

## 2023-03-08 LAB — COMPREHENSIVE METABOLIC PANEL
ALT: 24 [IU]/L (ref 0–44)
AST: 31 [IU]/L (ref 0–40)
Albumin: 4.2 g/dL (ref 3.9–4.9)
Alkaline Phosphatase: 58 [IU]/L (ref 44–121)
BUN/Creatinine Ratio: 10 (ref 10–24)
BUN: 11 mg/dL (ref 8–27)
Bilirubin Total: 0.6 mg/dL (ref 0.0–1.2)
CO2: 24 mmol/L (ref 20–29)
Calcium: 9.4 mg/dL (ref 8.6–10.2)
Chloride: 105 mmol/L (ref 96–106)
Creatinine, Ser: 1.13 mg/dL (ref 0.76–1.27)
Globulin, Total: 2.5 g/dL (ref 1.5–4.5)
Glucose: 88 mg/dL (ref 70–99)
Potassium: 4.3 mmol/L (ref 3.5–5.2)
Sodium: 142 mmol/L (ref 134–144)
Total Protein: 6.7 g/dL (ref 6.0–8.5)
eGFR: 71 mL/min/{1.73_m2} (ref >59)

## 2023-03-09 ENCOUNTER — Other Ambulatory Visit: Payer: Self-pay | Admitting: Family Medicine

## 2023-03-09 MED ORDER — TESTOSTERONE CYPIONATE 200 MG/ML IM SOLN
200.0000 mg | INTRAMUSCULAR | Status: DC
Start: 2023-03-09 — End: 2023-04-14
  Administered 2023-03-08 – 2023-04-05 (×2): 200 mg via INTRAMUSCULAR

## 2023-03-09 NOTE — Progress Notes (Signed)
 Patient is in office today for a nurse visit for Testosterone Injection. Patient Injection was given in the  Right upper quad. gluteus. Patient tolerated injection well.

## 2023-03-16 DIAGNOSIS — Z1211 Encounter for screening for malignant neoplasm of colon: Secondary | ICD-10-CM | POA: Diagnosis not present

## 2023-03-16 DIAGNOSIS — I10 Essential (primary) hypertension: Secondary | ICD-10-CM | POA: Diagnosis not present

## 2023-03-16 DIAGNOSIS — Z860100 Personal history of colon polyps, unspecified: Secondary | ICD-10-CM | POA: Diagnosis not present

## 2023-03-16 DIAGNOSIS — K5730 Diverticulosis of large intestine without perforation or abscess without bleeding: Secondary | ICD-10-CM | POA: Diagnosis not present

## 2023-03-16 DIAGNOSIS — Z09 Encounter for follow-up examination after completed treatment for conditions other than malignant neoplasm: Secondary | ICD-10-CM | POA: Diagnosis not present

## 2023-03-16 LAB — HM COLONOSCOPY

## 2023-03-22 ENCOUNTER — Ambulatory Visit (INDEPENDENT_AMBULATORY_CARE_PROVIDER_SITE_OTHER): Payer: Medicare PPO

## 2023-03-22 DIAGNOSIS — E291 Testicular hypofunction: Secondary | ICD-10-CM | POA: Diagnosis not present

## 2023-03-22 MED ORDER — TESTOSTERONE CYPIONATE 200 MG/ML IM SOLN
200.0000 mg | Freq: Once | INTRAMUSCULAR | Status: AC
Start: 2023-03-22 — End: 2023-03-22
  Administered 2023-03-22: 200 mg via INTRAMUSCULAR

## 2023-03-22 NOTE — Progress Notes (Signed)
Patient is in office today for a nurse visit for Testosterone Injection. Patient Injection was given in the  Right upper quad. gluteus. Patient tolerated injection well.

## 2023-03-28 ENCOUNTER — Ambulatory Visit: Payer: Medicare PPO | Attending: Cardiology | Admitting: Cardiology

## 2023-03-28 ENCOUNTER — Encounter: Payer: Self-pay | Admitting: Cardiology

## 2023-03-28 VITALS — BP 122/74 | HR 61 | Ht 70.0 in | Wt 219.0 lb

## 2023-03-28 DIAGNOSIS — R5383 Other fatigue: Secondary | ICD-10-CM

## 2023-03-28 DIAGNOSIS — I110 Hypertensive heart disease with heart failure: Secondary | ICD-10-CM | POA: Diagnosis not present

## 2023-03-28 DIAGNOSIS — I739 Peripheral vascular disease, unspecified: Secondary | ICD-10-CM | POA: Diagnosis not present

## 2023-03-28 DIAGNOSIS — I5030 Unspecified diastolic (congestive) heart failure: Secondary | ICD-10-CM | POA: Diagnosis not present

## 2023-03-28 DIAGNOSIS — I2510 Atherosclerotic heart disease of native coronary artery without angina pectoris: Secondary | ICD-10-CM | POA: Diagnosis not present

## 2023-03-28 NOTE — Progress Notes (Signed)
Cardiology Office Note:    Date:  03/28/2023   ID:  TIRRELL BUCHBERGER, DOB Jun 29, 1955, MRN [id number]  PCP:  [name], MD  Cardiologist:  Gypsy Balsam, MD    Referring MD: [name], MD   Chief Complaint  Patient presents with   Fatigue   Shortness of Breath    History of Present Illness:    Mark Mejia is a 68 y.o. male past medical history significant for atypical chest pain, he did have coronary CT angio done which showed only minimal disease 0 to 25%, cardiac arterial disease up to 69% stenosis on 1 side, dyslipidemia, essential hypertension.  Comes today to months for follow-up with his wife.  He complains very weak tired exhausted.  He sleeps majority of time sometimes he goes in the last day but his wife anticipate him to do much more.  He also describes some episode of low blood pressure in the matter-of-fact 1 time he end up being in the emergency room.  He is blame Tizanidine which is a muscle relaxant that he takes for this he noticed a drop of blood pressure after he take that medication 1 time by mistake he took some extra dose eventually dropped his blood pressure quite significantly.  Denies have any chest pain tightness squeezing pressure mid chest.  Snores somewhat but was checked for sleep apnea apparently negative.  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    Hypertrophic scar of skin 09/29/2016   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   Mixed hyperlipidemia    Peripheral vascular disease, asymptomatic (HCC) 05/23/2017   80-99% stenosis in the left internal coronary artery   Precordial chest pain 12/26/2015   Shortness of breath 12/26/2015   Testicular hypofunction    Vitamin D deficiency     Past Surgical History:  Procedure Laterality Date   GASTROSTOMY W/ FEEDING TUBE     LEFT HEART CATH AND CORONARY ANGIOGRAPHY N/A 05/25/2017   Procedure: LEFT HEART CATH AND CORONARY ANGIOGRAPHY;  Surgeon: [place], Peter M, MD;  Location: [facility];  Service: Cardiovascular;  Laterality: N/A;   PORTACATH PLACEMENT     portacath removal     THROAT SURGERY      Current Medications: Current Meds  Medication Sig   amLODipine (NORVASC) 5 MG tablet TAKE 1 TABLET EVERY DAY   aspirin EC 81 MG tablet Take 81 mg by mouth daily.    ciclopirox (PENLAC) 8 % solution Apply topically at bedtime. Apply over nail and surrounding skin. Apply daily over previous coat. After seven (7) days, may remove with alcohol and continue cycle. (Patient taking differently: Apply 1 tablet topically at bedtime. Apply over nail and surrounding skin. Apply daily over previous coat. After seven (7) days, may remove with alcohol and continue cycle.)   dorzolamide-timolol (COSOPT) 22.3-6.8 MG/ML ophthalmic solution Place 1 drop into the right eye 2 (two)  times daily.   eszopiclone (LUNESTA) 2 MG TABS tablet TAKE 1 TABLET IMMEDIATELY BEFORE BEDTIME AS NEEDED FOR SLEEP (Patient taking differently: Take 2 mg by mouth at bedtime as needed for sleep.)   famotidine (PEPCID) 40 MG tablet Take 40 mg by mouth daily.   Glycerin-Hypromellose-PEG 400 ([facility] DRY EYE RELIEF OP) Place 2 drops into both eyes daily as needed (for dry eyes).   HYDROcodone-acetaminophen (NORCO/VICODIN) 5-325 MG tablet Take 1 tablet by mouth every 6 (six) hours as needed for moderate pain.   ketoconazole (NIZORAL) 2 % cream Apply 1  Application topically daily.   latanoprost (XALATAN) 0.005 % ophthalmic solution Place 1 drop into the right eye at bedtime.   levothyroxine (SYNTHROID) 88 MCG tablet TAKE 1 TABLET EVERY DAY BEFORE BREAKFAST (Patient taking differently: Take 88 mcg by mouth daily before breakfast.)   meloxicam (MOBIC) 7.5 MG tablet TAKE 1 TABLET TWICE DAILY (Patient taking differently: Take 7.5 mg by mouth 2 (two) times daily. TAKE 1 TABLET TWICE DAILY)   metoprolol tartrate (LOPRESSOR) 25 MG tablet TAKE 1 TABLET TWICE DAILY   nitroGLYCERIN (NITROSTAT) 0.4 MG SL tablet Place 1 tablet (0.4 mg total) under the tongue every 5 (five) minutes as needed for chest pain.   omeprazole (PRILOSEC) 40 MG capsule Take 1 capsule (40 mg total) by mouth daily. 1rst attempt, patient needs and appt in February for additional refills   rosuvastatin (CRESTOR) 20 MG tablet TAKE 1 TABLET EVERY DAY   sildenafil (VIAGRA) 100 MG tablet TAKE 1/2 TO 1 TABLET EVERY DAY 30-60 MIN PRIOR TO INTERCOURSE AS NEEDED FOR ERECTILE DYSFUNCTION AS DIRECTED (Patient taking differently: Take 50 mg by mouth as needed for erectile dysfunction.)   tamsulosin (FLOMAX) 0.4 MG CAPS capsule TAKE 2 CAPSULES EVERY DAY   Testosterone Enanthate (XYOSTED) 50 MG/0.5ML SOAJ Inject 0.5 mLs into the skin once a week.   tiZANidine (ZANAFLEX) 4 MG tablet TAKE 1 TABLET EVERY 6 HOURS AS NEEDED FOR MUSCLE SPASM(S) (Patient taking differently: Take 4 mg by mouth every 8 (eight) hours as needed for muscle spasms.)   Vitamin D, Ergocalciferol, (DRISDOL) 1.25 MG (50000 UNIT) CAPS capsule TAKE 1 CAPSULE EVERY SUNDAY. (Patient taking differently: Take 50,000 Units by mouth every 7 (seven) days.)   Current Facility-Administered Medications for the 03/28/23 encounter (Office Visit) with Georgeanna Lea, MD  Medication   testosterone cypionate (DEPOTESTOSTERONE CYPIONATE) injection 200 mg   testosterone cypionate (DEPOTESTOSTERONE CYPIONATE) injection 200 mg     Allergies:    Patient has no known allergies.   Social History   Socioeconomic History   Marital status: Married    Spouse name: LAURA   Number of children: 1   Years of education: 12 + 2   Highest education level: Not on file  Occupational History   Occupation: Retired  Tobacco Use   Smoking status: Never   Smokeless tobacco: Never  Vaping Use   Vaping status: Never Used  Substance and Sexual Activity   Alcohol use: No   Drug use: No   Sexual activity: Yes  Other Topics Concern   Not on file  Social History Narrative   Currently retired/disabled   Social Drivers of Health   Financial Resource Strain: Patient Declined (02/04/2023)   Overall Financial Resource Strain (CARDIA)    Difficulty of Paying Living Expenses: Patient declined  Food Insecurity: Patient Declined (02/04/2023)   Hunger Vital Sign    Worried About Running Out of Food in the Last Year: Patient declined  Ran Out of Food in the Last Year: Patient declined  Transportation Needs: Patient Declined (02/04/2023)   PRAPARE - Administrator, Civil Service (Medical): Patient declined    Lack of Transportation (Non-Medical): Patient declined  Physical Activity: Unknown (02/04/2023)   Exercise Vital Sign    Days of Exercise per Week: Patient declined    Minutes of Exercise per Session: 10 min  Recent Concern: Physical Activity - Insufficiently Active (11/28/2022)   Exercise Vital Sign    Days of Exercise per Week: 3 days    Minutes of Exercise per Session: 10 min  Stress: Patient Declined (02/04/2023)   [employer] of Occupational Health - Occupational Stress Questionnaire    Feeling of Stress : Patient declined  Social Connections: Unknown (02/04/2023)   Social Connection and Isolation Panel [NHANES]    Frequency of Communication with Friends and Family: Patient declined    Frequency of Social Gatherings with Friends and Family: Patient declined    Attends Religious Services: Patient declined    Automotive engineer or Organizations: Patient declined    Attends Engineer, structural: More than 4 times per year    Marital Status: Patient declined     Family History: The patient's family history includes Arthritis in his mother; Cancer - Other in his maternal grandmother; Hypertension in his brother and mother. ROS:   Please see the history of present illness.    All 14 point review of systems negative except as described per history of present illness  EKGs/Labs/Other Studies Reviewed:    EKG Interpretation Date/Time:  Monday March 28 2023 10:23:52 EST Ventricular Rate:  61 PR Interval:  204 QRS Duration:  100 QT Interval:  400 QTC Calculation: 402 R Axis:   -4  Text Interpretation: Normal sinus rhythm ST & T wave abnormality, consider lateral ischemia Abnormal ECG When compared with ECG of 01-Oct-2022 15:17, PREVIOUS ECG IS PRESENT Confirmed by Gypsy Balsam [phone]) on 03/28/2023 10:34:31 AM    Recent Labs: 06/18/2022: TSH 3.870 02/04/2023: Hemoglobin 12.7; Platelets 135 03/08/2023: ALT 24; BUN 11; Creatinine, Ser 1.13; Potassium 4.3; Sodium 142  Recent Lipid Panel    Component Value Date/Time   CHOL 104 06/18/2022 0930   TRIG 92 06/18/2022 0930   HDL 31 (L) 06/18/2022 0930   CHOLHDL 3.4 06/18/2022 0930   LDLCALC 55 06/18/2022 0930    Physical Exam:    VS:  BP 122/74 (BP Location: Right Arm, Patient Position: Sitting)   Pulse 61   Ht 5\' 10"  (1.778 m)   Wt 219 lb (99.3 kg)   SpO2 97%   BMI 31.42 kg/m     Wt Readings from Last 3 Encounters:  03/28/23 219 lb (99.3 kg)  02/04/23 221 lb 12.8 oz (100.6 kg)  12/01/22 211 lb 3.2 oz (95.8 kg)     GEN:  Well nourished, well developed in no acute distress HEENT: Normal NECK: No JVD; No carotid bruits LYMPHATICS: No lymphadenopathy CARDIAC: RRR, no murmurs, no rubs, no gallops RESPIRATORY:  Clear to auscultation without rales, wheezing or rhonchi  ABDOMEN: Soft, non-tender, non-distended MUSCULOSKELETAL:   No edema; No deformity  SKIN: Warm and dry LOWER EXTREMITIES: no swelling NEUROLOGIC:  Alert and oriented x 3 PSYCHIATRIC:  Normal affect   ASSESSMENT:    1. 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 Coronary artery disease involving native coronary artery of native heart without angina pectoris   3. Peripheral vascular disease, asymptomatic (HCC)  4. Other fatigue    PLAN:    In order of problems listed above:  History of atypical chest pain denies having any.  Coronary CT angio showed only minimal disease. Profound weakness fatigue tiredness.  I will schedule him to have an echocardiogram to assess left ventricle ejection fraction.  Will schedule him also to do carotic ultrasound to check on both lesions on both sides.  I will check vitamin B12 TSH and vitamin D3 level to look for any reason for his symptomatology. Peripheral vascular disease carotic ultrasounds will be done. Dyslipidemia I did review K PN which show me LDL 55 HDL 31 good cholesterol control we will continue present management   Medication Adjustments/Labs and Tests Ordered: Current medicines are reviewed at length with the patient today.  Concerns regarding medicines are outlined above.  Orders Placed This Encounter  Procedures   EKG 12-Lead   Medication changes: No orders of the defined types were placed in this encounter.   Signed, Georgeanna Lea, MD, [facility] 03/28/2023 10:51 AM    [facility]

## 2023-03-28 NOTE — Patient Instructions (Addendum)
Medication Instructions:  Your physician recommends that you continue on your current medications as directed. Please refer to the Current Medication list given to you today.  *If you need a refill on your cardiac medications before your next appointment, please call your pharmacy*   Lab Work: Your physician recommends that you have labs done in the office today. Your test included  TSH, Vitamin B 12, and Vitamin D3.   If you have labs (blood work) drawn today and your tests are completely normal, you will receive your results only by: MyChart Message (if you have MyChart) OR A paper copy in the mail If you have any lab test that is abnormal or we need to change your treatment, we will call you to review the results.   Testing/Procedures: Echocardiogram An echocardiogram is a test that uses sound waves (ultrasound) to produce images of the heart. Images from an echocardiogram can provide important information about: Heart size and shape. The size and thickness and movement of your heart's walls. Heart muscle function and strength. Heart valve function or if you have stenosis. Stenosis is when the heart valves are too narrow. If blood is flowing backward through the heart valves (regurgitation). A tumor or infectious growth around the heart valves. Areas of heart muscle that are not working well because of poor blood flow or injury from a heart attack. Aneurysm detection. An aneurysm is a weak or damaged part of an artery wall. The wall bulges out from the normal force of blood pumping through the body. Tell a health care provider about: Any allergies you have. All medicines you are taking, including vitamins, herbs, eye drops, creams, and over-the-counter medicines. Any blood disorders you have. Any surgeries you have had. Any medical conditions you have. Whether you are pregnant or may be pregnant. What are the risks? Generally, this is a safe test. However, problems may occur,  including an allergic reaction to dye (contrast) that may be used during the test. What happens before the test? No specific preparation is needed. You may eat and drink normally. What happens during the test?  You will take off your clothes from the waist up and put on a hospital gown. Electrodes or electrocardiogram (ECG)patches may be placed on your chest. The electrodes or patches are then connected to a device that monitors your heart rate and rhythm. You will lie down on a table for an ultrasound exam. A gel will be applied to your chest to help sound waves pass through your skin. A handheld device, called a transducer, will be pressed against your chest and moved over your heart. The transducer produces sound waves that travel to your heart and bounce back (or "echo" back) to the transducer. These sound waves will be captured in real-time and changed into images of your heart that can be viewed on a video monitor. The images will be recorded on a computer and reviewed by your health care provider. You may be asked to change positions or hold your breath for a short time. This makes it easier to get different views or better views of your heart. In some cases, you may receive contrast through an IV in one of your veins. This can improve the quality of the pictures from your heart. The procedure may vary among health care providers and hospitals. What can I expect after the test? You may return to your normal, everyday life, including diet, activities, and medicines, unless your health care provider tells you not to do  that. Follow these instructions at home: It is up to you to get the results of your test. Ask your health care provider, or the department that is doing the test, when your results will be ready. Keep all follow-up visits. This is important. Summary An echocardiogram is a test that uses sound waves (ultrasound) to produce images of the heart. Images from an echocardiogram can  provide important information about the size and shape of your heart, heart muscle function, heart valve function, and other possible heart problems. You do not need to do anything to prepare before this test. You may eat and drink normally. After the echocardiogram is completed, you may return to your normal, everyday life, unless your health care provider tells you not to do that. This information is not intended to replace advice given to you by your health care provider. Make sure you discuss any questions you have with your health care provider. Document Revised: 10/29/2020 Document Reviewed: 10/09/2019 Elsevier Patient Education  2023 Elsevier Inc.     Your physician has requested that you have a carotid duplex. This test is an ultrasound of the carotid arteries in your neck. It looks at blood flow through these arteries that supply the brain with blood. Allow one hour for this exam. There are no restrictions or special instructions.    Follow-Up: At [facility], you and your health needs are our priority.  As part of our continuing mission to provide you with exceptional heart care, we have created designated Provider Care Teams.  These Care Teams include your primary Cardiologist (physician) and Advanced Practice Providers (APPs -  Physician Assistants and Nurse Practitioners) who all work together to provide you with the care you need, when you need it.  We recommend signing up for the patient portal called "MyChart".  Sign up information is provided on this After Visit Summary.  MyChart is used to connect with patients for Virtual Visits (Telemedicine).  Patients are able to view lab/test results, encounter notes, upcoming appointments, etc.  Non-urgent messages can be sent to your provider as well.   To learn more about what you can do with MyChart, go to [email or web address].    Your next appointment:   6 month follow up

## 2023-03-28 NOTE — Addendum Note (Signed)
Addended by: Lonia Farber on: 03/28/2023 10:58 AM   Modules accepted: Orders

## 2023-03-29 ENCOUNTER — Ambulatory Visit: Payer: Medicare PPO | Admitting: Podiatry

## 2023-03-29 ENCOUNTER — Encounter: Payer: Self-pay | Admitting: Podiatry

## 2023-03-29 DIAGNOSIS — B351 Tinea unguium: Secondary | ICD-10-CM | POA: Diagnosis not present

## 2023-03-29 DIAGNOSIS — M79674 Pain in right toe(s): Secondary | ICD-10-CM | POA: Diagnosis not present

## 2023-03-29 DIAGNOSIS — M79675 Pain in left toe(s): Secondary | ICD-10-CM | POA: Diagnosis not present

## 2023-03-29 LAB — VITAMIN D 25 HYDROXY (VIT D DEFICIENCY, FRACTURES): Vit D, 25-Hydroxy: 43.1 ng/mL (ref 30.0–100.0)

## 2023-03-29 LAB — VITAMIN B12: Vitamin B-12: 655 pg/mL (ref 232–1245)

## 2023-03-29 LAB — TSH: TSH: 2.73 u[IU]/mL (ref 0.450–4.500)

## 2023-03-29 MED ORDER — CICLOPIROX 8 % EX SOLN: Freq: Every day | CUTANEOUS | 0 refills | Status: AC

## 2023-03-29 NOTE — Progress Notes (Signed)
  Subjective:  Patient ID: Mark Mejia, male    DOB: 28-Oct-1955,  MRN: [id number]  Chief Complaint  Patient presents with   RFC    Needs nails trimmed. Follow up on tinea pedis, pt is moisturizing his feet well but still gets excess nail growth under the LT 1st. Not diabetic, no anticoag.     68 y.o. male presents for follow-up on bilateral hallux dystrophy.  Being treated with Penlac topical antifungal.  Tinea pedis has improved, he still has some dry pedal skin present. 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  Nails elongated thickened and dystrophic x 5 bilateral foot. Does have some mild dry pedal skin without associated  rash.  Vascular:  Dorsalis Pedis artery and Posterior Tibial artery pedal pulses are 2/4 bilateral.  Capillary fill time < 3 sec to all digits.   Neruologic: Grossly intact via light touch bilateral. Protective threshold intact to all sites bilateral.   Musculoskeletal: No gross boney pedal deformities bilateral. No pain, crepitus, or limitation noted with foot and ankle range of motion bilateral. Muscular strength 5/5 in all groups tested bilateral.  Gait: Unassisted, Nonantalgic.   No images are attached to the encounter.  Assessment:   1. Pain due to onychomycosis of toenails of both feet       Plan:  Patient was evaluated and treated and all questions answered.   # Onychomycosis bilateral hallux nail Onychomycosis -Educated on etiology of nail fungus. -Discussed treatment options with patient including topical and oral antifungal. -Patient wishes to defer oral antifungal due to risk of liver toxicity -eRx for Penlac 8% topical solution applied daily at night to all affected nails for the next 6 months.  #Onychomycosis with pain  -Nails palliatively debrided as below. -Educated on self-care  Procedure: Nail Debridement Rationale: Pain Type of Debridement: manual, sharp debridement. Instrumentation: Nail nipper, rotary burr. Number of Nails: 10   Return in about 3 months (around 06/27/2023) for Diabetic Foot Care.          Bronwen Betters, DPM [facility]

## 2023-04-01 ENCOUNTER — Telehealth: Payer: Self-pay

## 2023-04-01 NOTE — Telephone Encounter (Signed)
Left message on My Chart with normal lab results per Dr. Vanetta Shawl note

## 2023-04-05 ENCOUNTER — Ambulatory Visit (INDEPENDENT_AMBULATORY_CARE_PROVIDER_SITE_OTHER): Payer: Medicare PPO

## 2023-04-05 DIAGNOSIS — E291 Testicular hypofunction: Secondary | ICD-10-CM | POA: Diagnosis not present

## 2023-04-05 NOTE — Progress Notes (Signed)
      Patient: Mark Mejia  DOB: Oct 20, 1955  MRN: [id number]    Visit Date: 04/05/2023    Mark Mejia presents today for his testosterone  injection.  Patient tolerated the injection well and has no questions.  Patient's next injection will be due in 14 days.  Administrations This Visit     testosterone  cypionate (DEPOTESTOSTERONE CYPIONATE) injection 200 mg     Admin Date 04/05/2023 Action Given Dose 200 mg Route Intramuscular Documented By Claudene Suzen HERO, LPN             Suzen HERO Claudene, LPN  97/95/74 88:80 AM

## 2023-04-06 ENCOUNTER — Telehealth: Payer: Self-pay

## 2023-04-06 NOTE — Telephone Encounter (Signed)
 Pt viewed lab results on My Chart per Dr. Vanetta Shawl note. Routed to PCP.

## 2023-04-14 ENCOUNTER — Other Ambulatory Visit: Payer: Self-pay | Admitting: Physician Assistant

## 2023-04-14 ENCOUNTER — Other Ambulatory Visit: Payer: Self-pay | Admitting: Cardiology

## 2023-04-15 ENCOUNTER — Other Ambulatory Visit: Payer: Self-pay | Admitting: Family Medicine

## 2023-04-18 ENCOUNTER — Ambulatory Visit: Payer: Medicare PPO | Attending: Cardiology

## 2023-04-18 ENCOUNTER — Ambulatory Visit (INDEPENDENT_AMBULATORY_CARE_PROVIDER_SITE_OTHER): Payer: Medicare PPO

## 2023-04-18 DIAGNOSIS — I2510 Atherosclerotic heart disease of native coronary artery without angina pectoris: Secondary | ICD-10-CM

## 2023-04-18 DIAGNOSIS — R5383 Other fatigue: Secondary | ICD-10-CM | POA: Diagnosis not present

## 2023-04-18 DIAGNOSIS — I739 Peripheral vascular disease, unspecified: Secondary | ICD-10-CM

## 2023-04-18 DIAGNOSIS — I110 Hypertensive heart disease with heart failure: Secondary | ICD-10-CM | POA: Diagnosis not present

## 2023-04-19 ENCOUNTER — Ambulatory Visit: Payer: Medicare PPO

## 2023-04-19 ENCOUNTER — Ambulatory Visit (INDEPENDENT_AMBULATORY_CARE_PROVIDER_SITE_OTHER): Payer: Medicare PPO | Admitting: Family Medicine

## 2023-04-19 ENCOUNTER — Other Ambulatory Visit: Payer: Self-pay | Admitting: Family Medicine

## 2023-04-19 ENCOUNTER — Encounter: Payer: Self-pay | Admitting: Family Medicine

## 2023-04-19 VITALS — BP 118/72 | HR 64 | Temp 97.8°F | Ht 70.0 in | Wt 223.0 lb

## 2023-04-19 DIAGNOSIS — I5030 Unspecified diastolic (congestive) heart failure: Secondary | ICD-10-CM | POA: Diagnosis not present

## 2023-04-19 DIAGNOSIS — E291 Testicular hypofunction: Secondary | ICD-10-CM | POA: Diagnosis not present

## 2023-04-19 DIAGNOSIS — E034 Atrophy of thyroid (acquired): Secondary | ICD-10-CM

## 2023-04-19 DIAGNOSIS — E782 Mixed hyperlipidemia: Secondary | ICD-10-CM

## 2023-04-19 DIAGNOSIS — K219 Gastro-esophageal reflux disease without esophagitis: Secondary | ICD-10-CM | POA: Diagnosis not present

## 2023-04-19 DIAGNOSIS — F5101 Primary insomnia: Secondary | ICD-10-CM

## 2023-04-19 DIAGNOSIS — Z125 Encounter for screening for malignant neoplasm of prostate: Secondary | ICD-10-CM

## 2023-04-19 DIAGNOSIS — I110 Hypertensive heart disease with heart failure: Secondary | ICD-10-CM

## 2023-04-19 MED ORDER — TESTOSTERONE CYPIONATE 200 MG/ML IM SOLN
200.0000 mg | Freq: Once | INTRAMUSCULAR | Status: AC
  Administered 2023-04-19: 200 mg via INTRAMUSCULAR

## 2023-04-19 NOTE — Assessment & Plan Note (Signed)
Previously well controlled Continue Synthroid at current dose  - labs drawn today, Await labs/testing for assessment and recommendations

## 2023-04-19 NOTE — Assessment & Plan Note (Signed)
Patient requested PSA test due to family history of prostate cancer. -Order PSA test.

## 2023-04-19 NOTE — Assessment & Plan Note (Signed)
Well controlled.  No changes to medicines. Continue Rosuvastatin 20 mg daily. Continue to work on eating a healthy diet and exercise.  -labs drawn today

## 2023-04-19 NOTE — Addendum Note (Signed)
Addended by: Precious Reel on: 04/19/2023 01:09 PM   Modules accepted: Orders

## 2023-04-19 NOTE — Assessment & Plan Note (Signed)
Patient reports occasional use of Lunesta for sleep, and regular use of tizanidine. -Continue current regimen.

## 2023-04-19 NOTE — Assessment & Plan Note (Signed)
Continue omeprazole 40 mg daily and pepcid 40 mg at bedtime.

## 2023-04-19 NOTE — Assessment & Plan Note (Signed)
-  In today for testosterone injection -Continue Xyosted every 14 days -Fu in 2 weeks

## 2023-04-19 NOTE — Progress Notes (Signed)
Subjective:  Patient ID: Mark Mejia, male    DOB: 1955-03-10  Age: 68 y.o. MRN: [id number]  Chief Complaint  Patient presents with   Medical Management of Chronic Issues   Discussed the use of AI scribe software for clinical note transcription with the patient, who gave verbal consent to proceed.   HPI Mark Mejia is a 68 year old male who presents for a routine follow-up visit.  He manages hypertension with metoprolol twice daily and amlodipine as needed when blood pressure exceeds 150 mmHg. Last night, his blood pressure was 153/80 mmHg, prompting amlodipine use. He is cautious with amlodipine due to potential hypotension, sometimes reaching 69 mmHg. He also takes a daily aspirin.  His medication regimen includes levothyroxine every morning on an empty stomach, rosuvastatin before bed, and omeprazole 40 mg for reflux. He takes Pepcid 40 mg at night and recently received a testosterone injection of 200 mg, scheduled every two weeks. He takes tamsulosin 0.4 mg, two tablets twice a day, usually after dinner. For sleep, he uses Lunesta occasionally and tizanidine more regularly, noting that taking Lunesta too late affects his sleep schedule.  His diet is rich in fruits and vegetables, including grilled chicken and salad three times a week, apples, blueberries, oranges, bananas, and various greens. He uses Metamucil for fiber intake. No recent chest pain, shortness of breath, fever, chills, nausea, vomiting, diarrhea, constipation, anxiety, or depression. He mentions dizziness if he takes tizanidine and stays up longer than expected.  He had a colonoscopy in December 2024, with the next one scheduled in eight years. He is due for a PSA test, as his brother-in-law had prostate cancer, and family members recommend regular PSA checks.   Hypertension: Checking bp daily. 155-160/80-86 in the mornings and 120's /70 's 2 hours after taking medications . On aspirin 81 mg daily, Metoprolol 25 mg twice  a day, and amlodipine 5 mg as needed for SBP higher than 150   Hypothyroidism: Takes Levothyroxine 88 mcg daily before breakast   Hyperlipidemia: Rosuvastatin 20 mg daily before bed   GERD: Omeprazole 40 mg daily in the afternoon, Pepcid 40 mg qhs   Hypogonadism male: Testosterone cypionate 200 mg every 14 days   BPH: Tamsulosin 0.4 mg take 2 tablets twice a day.   Insomnia: Lunesta 2 mg before bed as needed     04/19/2023   10:38 AM 12/01/2022    3:30 PM 10/18/2022   10:06 AM 05/06/2022   11:25 AM 02/16/2022    3:11 PM  Depression screen PHQ 2/9  Decreased Interest 0 0 0 0 0  Down, Depressed, Hopeless 0 0 0 0 0  PHQ - 2 Score 0 0 0 0 0  Altered sleeping 1 2 3     Tired, decreased energy 0 0 0    Change in appetite 0 0 0    Feeling bad or failure about yourself  0 0 0    Trouble concentrating 0 0 0    Moving slowly or fidgety/restless 0 0 0    Suicidal thoughts 0 0 0    PHQ-9 Score 1 2 3     Difficult doing work/chores Not difficult at all Not difficult at all Not difficult at all          04/19/2023   10:38 AM  Fall Risk   Falls in the past year? 0  Number falls in past yr: 0  Injury with Fall? 0  Risk for fall due to : No Fall  Risks  Follow up Falls evaluation completed    Patient Care Team: [name], MD as PCP - General (Internal Medicine) Georgeanna Lea, MD as Consulting Physician (Cardiology) Misenheimer, Marcial Pacas, MD as Consulting Physician (Unknown Physician Specialty)   Review of Systems  Constitutional:  Negative for appetite change, chills, fatigue and fever.  HENT:  Negative for congestion, ear pain, sinus pressure and sore throat.   Respiratory:  Negative for cough, chest tightness, shortness of breath and wheezing.   Cardiovascular:  Negative for chest pain and palpitations.  Gastrointestinal:  Negative for abdominal pain, constipation, diarrhea, nausea and vomiting.  Genitourinary:  Negative for dysuria, frequency and hematuria.   Musculoskeletal:  Negative for arthralgias, back pain, joint swelling and myalgias.  Skin:  Negative for rash.  Neurological:  Negative for dizziness, weakness and headaches.  Psychiatric/Behavioral:  Positive for sleep disturbance. Negative for dysphoric mood. The patient is not nervous/anxious.     Current Outpatient Medications on File Prior to Visit  Medication Sig Dispense Refill   amLODipine (NORVASC) 5 MG tablet TAKE 1 TABLET EVERY DAY 90 tablet 1   aspirin EC 81 MG tablet Take 81 mg by mouth daily.      ciclopirox (PENLAC) 8 % solution Apply topically at bedtime. Apply over nail and surrounding skin. Apply daily over previous coat. After seven (7) days, may remove with alcohol and continue cycle. 6.6 mL 0   dorzolamide-timolol (COSOPT) 22.3-6.8 MG/ML ophthalmic solution Place 1 drop into the right eye 2 (two) times daily.     eszopiclone (LUNESTA) 2 MG TABS tablet TAKE 1 TABLET IMMEDIATELY BEFORE BEDTIME AS NEEDED FOR SLEEP (Patient taking differently: Take 2 mg by mouth at bedtime as needed for sleep.) 30 tablet 5   famotidine (PEPCID) 40 MG tablet Take 40 mg by mouth daily.     furosemide (LASIX) 20 MG tablet TAKE 1 TABLET EVERY DAY 90 tablet 3   Glycerin-Hypromellose-PEG 400 ([facility] DRY EYE RELIEF OP) Place 2 drops into both eyes daily as needed (for dry eyes).     HYDROcodone-acetaminophen (NORCO/VICODIN) 5-325 MG tablet Take 1 tablet by mouth every 6 (six) hours as needed for moderate pain. 30 tablet 0   ketoconazole (NIZORAL) 2 % cream Apply 1 Application topically daily. 30 g 0   latanoprost (XALATAN) 0.005 % ophthalmic solution Place 1 drop into the right eye at bedtime.     levothyroxine (SYNTHROID) 88 MCG tablet TAKE 1 TABLET EVERY DAY BEFORE BREAKFAST (Patient taking differently: Take 88 mcg by mouth daily before breakfast.) 90 tablet 3   meloxicam (MOBIC) 7.5 MG tablet TAKE 1 TABLET TWICE DAILY (Patient taking differently: Take 7.5 mg by mouth 2 (two) times daily. TAKE 1 TABLET  TWICE DAILY) 180 tablet 3   metoprolol tartrate (LOPRESSOR) 25 MG tablet TAKE 1 TABLET TWICE DAILY 180 tablet 3   nitroGLYCERIN (NITROSTAT) 0.4 MG SL tablet Place 1 tablet (0.4 mg total) under the tongue every 5 (five) minutes as needed for chest pain. 25 tablet 1   omeprazole (PRILOSEC) 40 MG capsule Take 1 capsule (40 mg total) by mouth daily. 1rst attempt, patient needs and appt in February for additional refills 90 capsule 1   rosuvastatin (CRESTOR) 20 MG tablet TAKE 1 TABLET EVERY DAY 90 tablet 0   sildenafil (VIAGRA) 100 MG tablet TAKE 1/2 TO 1 TABLET EVERY DAY 30-60 MIN PRIOR TO INTERCOURSE AS NEEDED FOR ERECTILE DYSFUNCTION AS DIRECTED (Patient taking differently: Take 50 mg by mouth as needed for erectile dysfunction.) 6 tablet  11   tamsulosin (FLOMAX) 0.4 MG CAPS capsule TAKE 2 CAPSULES EVERY DAY 180 capsule 3   testosterone cypionate (DEPOTESTOSTERONE CYPIONATE) 200 MG/ML injection INJECT (200 MG) AS DIRECTED EVERY 14 DAYS. VIAL IS FOR SINGLE USE ONLY 10 mL 0   tiZANidine (ZANAFLEX) 4 MG tablet TAKE 1 TABLET EVERY 6 HOURS AS NEEDED FOR MUSCLE SPASM(S) (Patient taking differently: Take 4 mg by mouth every 8 (eight) hours as needed for muscle spasms.) 90 tablet 5   Vitamin D, Ergocalciferol, (DRISDOL) 1.25 MG (50000 UNIT) CAPS capsule TAKE 1 CAPSULE EVERY SUNDAY. (Patient taking differently: Take 50,000 Units by mouth every 7 (seven) days.) 12 capsule 3   No current facility-administered medications on file prior to visit.   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   Hypertrophic scar of skin 09/29/2016   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   Mixed hyperlipidemia    Peripheral vascular disease, asymptomatic (HCC) 05/23/2017   80-99% stenosis in the left internal coronary artery   Precordial chest pain 12/26/2015   Shortness of breath 12/26/2015   Testicular hypofunction    Vitamin D deficiency    Past Surgical History:  Procedure Laterality Date   GASTROSTOMY W/ FEEDING TUBE     LEFT HEART CATH AND CORONARY ANGIOGRAPHY N/A 05/25/2017   Procedure: LEFT HEART CATH AND CORONARY ANGIOGRAPHY;  Surgeon: [place], Peter M, MD;  Location: [facility];  Service: Cardiovascular;  Laterality: N/A;   PORTACATH PLACEMENT     portacath removal     THROAT SURGERY      Family History  Problem Relation Age of Onset   Arthritis Mother    Hypertension Mother    Hypertension Brother    Cancer - Other Maternal Grandmother    Social History   Socioeconomic History   Marital status: Married    Spouse name: LAURA   Number of children: 1   Years of education: 12 + 2   Highest education level: Not on file  Occupational History   Occupation: Retired  Tobacco Use   Smoking status: Never   Smokeless tobacco: Never  Vaping Use   Vaping status: Never Used  Substance and Sexual Activity   Alcohol use: No   Drug use: No   Sexual activity: Yes  Other Topics Concern   Not on file  Social History Narrative   Currently retired/disabled   Social Drivers of Health   Financial Resource Strain: Patient Declined (02/04/2023)   Overall Financial Resource Strain (CARDIA)    Difficulty of Paying Living Expenses: Patient declined  Food Insecurity: Patient Declined (02/04/2023)   Hunger Vital Sign    Worried About Running Out of Food in the Last Year: Patient declined    Ran Out of Food in  the Last Year: Patient declined  Transportation Needs: Patient Declined (02/04/2023)   PRAPARE - Administrator, Civil Service (  Medical): Patient declined    Lack of Transportation (Non-Medical): Patient declined  Physical Activity: Unknown (02/04/2023)   Exercise Vital Sign    Days of Exercise per Week: Patient declined    Minutes of Exercise per Session: 10 min  Recent Concern: Physical Activity - Insufficiently Active (11/28/2022)   Exercise Vital Sign    Days of Exercise per Week: 3 days    Minutes of Exercise per Session: 10 min  Stress: Patient Declined (02/04/2023)   [employer] of Occupational Health - Occupational Stress Questionnaire    Feeling of Stress : Patient declined  Social Connections: Unknown (02/04/2023)   Social Connection and Isolation Panel [NHANES]    Frequency of Communication with Friends and Family: Patient declined    Frequency of Social Gatherings with Friends and Family: Patient declined    Attends Religious Services: Patient declined    Database administrator or Organizations: Patient declined    Attends Engineer, structural: More than 4 times per year    Marital Status: Patient declined    Objective:  BP 118/72 (BP Location: Right Arm, Patient Position: Sitting)   Pulse 64   Temp 97.8 F (36.6 C) (Temporal)   Ht 5\' 10"  (1.778 m)   Wt 223 lb (101.2 kg)   SpO2 100%   BMI 32.00 kg/m      04/19/2023   10:36 AM 03/28/2023   10:28 AM 02/04/2023   11:20 AM  BP/Weight  Systolic BP 118 122 124  Diastolic BP 72 74 62  Wt. (Lbs) 223 219 221.8  BMI 32 kg/m2 31.42 kg/m2 31.82 kg/m2    Physical Exam Constitutional:      General: He is not in acute distress.    Appearance: Normal appearance. He is not ill-appearing.  Eyes:     Conjunctiva/sclera: Conjunctivae normal.  Neck:     Vascular: No carotid bruit.  Cardiovascular:     Rate and Rhythm: Normal rate and regular rhythm.     Heart sounds: Normal heart sounds. No murmur  heard. Pulmonary:     Effort: Pulmonary effort is normal.     Breath sounds: Normal breath sounds. No wheezing.  Abdominal:     General: Bowel sounds are normal.     Palpations: Abdomen is soft.     Tenderness: There is no abdominal tenderness.  Musculoskeletal:        General: Normal range of motion.  Skin:    General: Skin is warm.  Neurological:     Mental Status: He is alert. Mental status is at baseline.  Psychiatric:        Mood and Affect: Mood normal.        Behavior: Behavior normal.      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 Patient reports taking amlodipine only when blood pressure is above 150. BP Readings from Last 3 Encounters:  04/19/23 118/72  03/28/23 122/74  02/04/23 124/62  -Continue current regimen. -labs drawn today, Await labs/testing for assessment and recommendations   Orders: -     CBC with Differential/Platelet -     Comprehensive metabolic panel  Gastroesophageal reflux disease without esophagitis Assessment & Plan: Continue omeprazole 40 mg daily and pepcid 40 mg at bedtime.   Mixed hyperlipidemia Assessment & Plan: Well controlled.  No changes to medicines. Continue Rosuvastatin 20 mg daily. Continue to work on eating a healthy diet and exercise.  -labs drawn today  Orders: -     Lipid  panel  Atrophy of thyroid Assessment & Plan: Previously well controlled Continue Synthroid at current dose  - labs drawn today, Await labs/testing for assessment and recommendations    Orders: -     TSH  Male hypogonadism Assessment & Plan: -In today for testosterone injection -Continue Xyosted every 14 days -Fu in 2 weeks   Primary insomnia Assessment & Plan: Patient reports occasional use of Lunesta for sleep, and regular use of tizanidine. -Continue current regimen.   Screening for prostate cancer Assessment & Plan: Patient requested PSA test due to family history of prostate cancer. -Order PSA test.  Orders: -     PSA     No orders of the defined types were placed in this encounter.   Orders Placed This Encounter  Procedures   CBC with Differential/Platelet   Comprehensive metabolic panel   Lipid panel   TSH   PSA     Follow-up: Return in about 2 weeks (around 05/03/2023) for nurse visit, testoterone injection.  An After Visit Summary was printed and given to the patient.  Total time spent on today's visit was 37 minutes, including both face-to-face time and nonface-to-face time personally spent on review of chart (labs and imaging), discussing labs and goals, discussing further work-up, treatment options, referrals to specialist if needed, reviewing outside records if pertinent, answering patient's questions, and coordinating care.    Lajuana Matte, FNP [facility] [phone]

## 2023-04-19 NOTE — Assessment & Plan Note (Signed)
Blood pressure well controlled with current regimen of amlodipine as needed and metoprolol twice daily. Patient reports taking amlodipine only when blood pressure is above 150. BP Readings from Last 3 Encounters:  04/19/23 118/72  03/28/23 122/74  02/04/23 124/62  -Continue current regimen. -labs drawn today, Await labs/testing for assessment and recommendations

## 2023-04-20 ENCOUNTER — Ambulatory Visit: Payer: Medicare PPO | Admitting: Family Medicine

## 2023-04-20 LAB — CBC WITH DIFFERENTIAL/PLATELET
Basophils Absolute: 0 10*3/uL (ref 0.0–0.2)
Basos: 1 %
EOS (ABSOLUTE): 0.2 10*3/uL (ref 0.0–0.4)
Eos: 4 %
Hematocrit: 38.4 % (ref 37.5–51.0)
Hemoglobin: 12 g/dL — ABNORMAL LOW (ref 13.0–17.7)
Immature Grans (Abs): 0 10*3/uL (ref 0.0–0.1)
Immature Granulocytes: 0 %
Lymphocytes Absolute: 1.5 10*3/uL (ref 0.7–3.1)
Lymphs: 43 %
MCH: 25.9 pg — ABNORMAL LOW (ref 26.6–33.0)
MCHC: 31.3 g/dL — ABNORMAL LOW (ref 31.5–35.7)
MCV: 83 fL (ref 79–97)
Monocytes Absolute: 0.4 10*3/uL (ref 0.1–0.9)
Monocytes: 10 %
Neutrophils Absolute: 1.5 10*3/uL (ref 1.4–7.0)
Neutrophils: 42 %
Platelets: 159 10*3/uL (ref 150–450)
RBC: 4.64 x10E6/uL (ref 4.14–5.80)
RDW: 14.2 % (ref 11.6–15.4)
WBC: 3.6 10*3/uL (ref 3.4–10.8)

## 2023-04-20 LAB — COMPREHENSIVE METABOLIC PANEL
ALT: 20 [IU]/L (ref 0–44)
AST: 33 [IU]/L (ref 0–40)
Albumin: 4.2 g/dL (ref 3.9–4.9)
Alkaline Phosphatase: 58 [IU]/L (ref 44–121)
BUN/Creatinine Ratio: 7 — ABNORMAL LOW (ref 10–24)
BUN: 8 mg/dL (ref 8–27)
Bilirubin Total: 0.7 mg/dL (ref 0.0–1.2)
CO2: 26 mmol/L (ref 20–29)
Calcium: 9.1 mg/dL (ref 8.6–10.2)
Chloride: 104 mmol/L (ref 96–106)
Creatinine, Ser: 1.21 mg/dL (ref 0.76–1.27)
Globulin, Total: 2.5 g/dL (ref 1.5–4.5)
Glucose: 82 mg/dL (ref 70–99)
Potassium: 4.5 mmol/L (ref 3.5–5.2)
Sodium: 142 mmol/L (ref 134–144)
Total Protein: 6.7 g/dL (ref 6.0–8.5)
eGFR: 65 mL/min/{1.73_m2} (ref >59)

## 2023-04-20 LAB — LIPID PANEL
Chol/HDL Ratio: 3.3 {ratio} (ref 0.0–5.0)
Cholesterol, Total: 77 mg/dL — ABNORMAL LOW (ref 100–199)
HDL: 23 mg/dL — ABNORMAL LOW (ref >39)
LDL Chol Calc (NIH): 41 mg/dL (ref 0–99)
Triglycerides: 53 mg/dL (ref 0–149)
VLDL Cholesterol Cal: 13 mg/dL (ref 5–40)

## 2023-04-20 LAB — TSH: TSH: 7.68 u[IU]/mL — ABNORMAL HIGH (ref 0.450–4.500)

## 2023-04-22 ENCOUNTER — Telehealth: Payer: Self-pay

## 2023-04-22 LAB — ECHOCARDIOGRAM COMPLETE: S' Lateral: 3.4 cm

## 2023-04-22 NOTE — Telephone Encounter (Signed)
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

## 2023-04-22 NOTE — Telephone Encounter (Signed)
 Patient notified of results and verbalized understanding.

## 2023-05-03 ENCOUNTER — Ambulatory Visit (INDEPENDENT_AMBULATORY_CARE_PROVIDER_SITE_OTHER): Payer: Medicare PPO

## 2023-05-03 DIAGNOSIS — E291 Testicular hypofunction: Secondary | ICD-10-CM

## 2023-05-03 MED ORDER — TESTOSTERONE CYPIONATE 200 MG/ML IM SOLN
200.0000 mg | Freq: Once | INTRAMUSCULAR | Status: AC
Start: 2023-05-03 — End: 2023-05-03
  Administered 2023-05-03: 200 mg via INTRAMUSCULAR

## 2023-05-03 NOTE — Progress Notes (Signed)
 Patient is in office today for a nurse visit for Testosterone Injection. Patient Injection was given in the  Right upper quad. gluteus. Patient tolerated injection well.

## 2023-05-12 ENCOUNTER — Other Ambulatory Visit: Payer: Self-pay | Admitting: Cardiology

## 2023-05-17 ENCOUNTER — Ambulatory Visit (INDEPENDENT_AMBULATORY_CARE_PROVIDER_SITE_OTHER)

## 2023-05-17 DIAGNOSIS — R7989 Other specified abnormal findings of blood chemistry: Secondary | ICD-10-CM

## 2023-05-17 DIAGNOSIS — E291 Testicular hypofunction: Secondary | ICD-10-CM

## 2023-05-17 MED ORDER — TESTOSTERONE CYPIONATE 200 MG/ML IM SOLN
200.0000 mg | Freq: Once | INTRAMUSCULAR | Status: AC
  Administered 2023-05-17: 200 mg via INTRAMUSCULAR

## 2023-05-17 NOTE — Progress Notes (Signed)
 Patient is in office today for a nurse visit for Testosterone Injection. Patient Injection was given in the  Right upper quad. gluteus. Patient tolerated injection well.

## 2023-05-18 LAB — T4, FREE: Free T4: 1.05 ng/dL (ref 0.82–1.77)

## 2023-05-18 LAB — T3, FREE: T3, Free: 3.6 pg/mL (ref 2.0–4.4)

## 2023-05-18 LAB — TSH: TSH: 3.7 u[IU]/mL (ref 0.450–4.500)

## 2023-05-19 ENCOUNTER — Telehealth: Payer: Self-pay

## 2023-05-19 NOTE — Telephone Encounter (Signed)
 Echo Results reviewed with pt as per Dr. Vanetta Shawl note.  Pt verbalized understanding and had no additional questions. Routed to PCP

## 2023-05-26 ENCOUNTER — Ambulatory Visit (INDEPENDENT_AMBULATORY_CARE_PROVIDER_SITE_OTHER)

## 2023-05-26 VITALS — BP 128/86 | HR 64 | Temp 97.5°F | Ht 70.0 in | Wt 216.0 lb

## 2023-05-26 DIAGNOSIS — R0981 Nasal congestion: Secondary | ICD-10-CM | POA: Diagnosis not present

## 2023-05-26 DIAGNOSIS — R040 Epistaxis: Secondary | ICD-10-CM

## 2023-05-26 MED ORDER — FLUTICASONE PROPIONATE 50 MCG/ACT NA SUSP: 2.0000 | Freq: Every day | NASAL | 1 refills | Status: DC

## 2023-05-26 NOTE — Progress Notes (Unsigned)
 Acute Office Visit  Subjective:    Patient ID: Mark Mejia, male    DOB: 03-Oct-1955, 68 y.o.   MRN: [id number]  Chief Complaint  Patient presents with   Epistaxis    Discussed the use of AI scribe software for clinical note transcription with the patient, who gave verbal consent to proceed.       HPI: Mark Mejia is a 68 year old male who presents with nasal congestion and epistaxis.  He has been experiencing nasal congestion and epistaxis for the past couple of weeks. The congestion is described as a 'stopped up' feeling, particularly at night, affecting his ability to breathe through his nose. The congestion alternates between nostrils, with the right side being more affected. He notices blood when blowing his nose, especially in the morning, which he attributes to dryness and scabbing. No spontaneous epistaxis occurs during sleep.  He denies using any nasal sprays but takes Claritin for allergies. His nose feels swollen and tender, particularly on the right side, and he experiences headaches. He also mentions post-nasal drainage and a sensation of not getting enough air through his nose during the day, despite being able to breathe through it. No sleep apnea, but he acknowledges snoring, as reported by his wife. He has to breathe through his mouth at night due to nasal congestion, resulting in a dry mouth upon waking. He has been monitoring his blood pressure, which is normal.  Regarding his vision, he mentions ongoing treatment for cataracts and notes a difference in vision between his eyes, with 20/20 vision in the left eye and 20/40 in the right eye. He reports some damage to his right eye and notes that his right pupil is dilated and nonreactive, while the left pupil is constricted and reactive.  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   Hypertrophic scar of skin 09/29/2016   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   Mixed hyperlipidemia    Peripheral vascular disease, asymptomatic (HCC) 05/23/2017   80-99% stenosis in the left internal coronary artery   Precordial chest pain 12/26/2015   Shortness of breath 12/26/2015   Testicular hypofunction    Vitamin D deficiency     Past Surgical History:  Procedure Laterality Date   GASTROSTOMY W/ FEEDING TUBE     LEFT HEART CATH AND CORONARY ANGIOGRAPHY N/A 05/25/2017   Procedure: LEFT HEART CATH AND CORONARY ANGIOGRAPHY;  Surgeon: [place], Peter M, MD;  Location: [facility];  Service: Cardiovascular;  Laterality: N/A;   PORTACATH PLACEMENT     portacath removal     THROAT SURGERY      Family History  Problem Relation Age of Onset   Arthritis Mother    Hypertension Mother    Hypertension Brother    Cancer - Other Maternal Grandmother     Social History   Socioeconomic History   Marital status: Married  Spouse name: LAURA   Number of children: 1   Years of education: 12 + 2   Highest education level: Not on file  Occupational History   Occupation: Retired  Tobacco Use   Smoking status: Never   Smokeless tobacco: Never  Vaping Use   Vaping status: Never Used  Substance and Sexual Activity   Alcohol use: No   Drug use: No   Sexual activity: Yes  Other Topics Concern   Not on file  Social History Narrative   Currently retired/disabled   Social Drivers of Health   Financial Resource Strain:  Patient Declined (05/25/2023)   Overall Financial Resource Strain (CARDIA)    Difficulty of Paying Living Expenses: Patient declined  Food Insecurity: Patient Declined (05/25/2023)   Hunger Vital Sign    Worried About Running Out of Food in the Last Year: Patient declined    Ran Out of Food in the Last Year: Patient declined  Transportation Needs: Patient Declined (05/25/2023)   PRAPARE - Administrator, Civil Service (Medical): Patient declined    Lack of Transportation (Non-Medical): Patient declined  Physical Activity: Sufficiently Active (05/25/2023)   Exercise Vital Sign    Days of Exercise per Week: 4 days    Minutes of Exercise per Session: 60 min  Stress: No Stress Concern Present (05/25/2023)   [employer] of Occupational Health - Occupational Stress Questionnaire    Feeling of Stress : Not at all  Social Connections: Socially Integrated (05/25/2023)   Social Connection and Isolation Panel [NHANES]    Frequency of Communication with Friends and Family: More than three times a week    Frequency of Social Gatherings with Friends and Family: Once a week    Attends Religious Services: More than 4 times per year    Active Member of [employer] or Organizations: Yes    Attends Engineer, structural: More than 4 times per year    Marital Status: Married  Catering manager Violence: Not At Risk (12/01/2022)   Humiliation, Afraid, Rape, and Kick questionnaire    Fear of Current or Ex-Partner: No    Emotionally Abused: No    Physically Abused: No    Sexually Abused: No    Outpatient Medications Prior to Visit  Medication Sig Dispense Refill   amLODipine (NORVASC) 5 MG tablet TAKE 1 TABLET EVERY DAY 90 tablet 1   aspirin EC 81 MG tablet Take 81 mg by mouth daily.      ciclopirox (PENLAC) 8 % solution Apply topically at bedtime. Apply over nail and surrounding skin. Apply daily over previous coat. After seven (7) days, may remove with alcohol and continue cycle. 6.6 mL 0    dorzolamide-timolol (COSOPT) 22.3-6.8 MG/ML ophthalmic solution Place 1 drop into the right eye 2 (two) times daily.     eszopiclone (LUNESTA) 2 MG TABS tablet Take 1 tablet (2 mg total) by mouth at bedtime as needed for sleep. 30 tablet 5   famotidine (PEPCID) 40 MG tablet Take 40 mg by mouth daily.     furosemide (LASIX) 20 MG tablet TAKE 1 TABLET EVERY DAY 90 tablet 3   Glycerin-Hypromellose-PEG 400 ([facility] DRY EYE RELIEF OP) Place 2 drops into both eyes daily as needed (for dry eyes).     HYDROcodone-acetaminophen (NORCO/VICODIN) 5-325 MG tablet Take 1 tablet by mouth every 6 (six) hours as needed for moderate pain. 30 tablet 0   ketoconazole (NIZORAL) 2 % cream Apply 1 Application topically daily. 30 g 0  latanoprost (XALATAN) 0.005 % ophthalmic solution Place 1 drop into the right eye at bedtime.     levothyroxine (SYNTHROID) 88 MCG tablet TAKE 1 TABLET EVERY DAY BEFORE BREAKFAST (Patient taking differently: Take 88 mcg by mouth daily before breakfast.) 90 tablet 3   meloxicam (MOBIC) 7.5 MG tablet TAKE 1 TABLET TWICE DAILY (Patient taking differently: Take 7.5 mg by mouth 2 (two) times daily. TAKE 1 TABLET TWICE DAILY) 180 tablet 3   metoprolol tartrate (LOPRESSOR) 25 MG tablet TAKE 1 TABLET TWICE DAILY 180 tablet 3   nitroGLYCERIN (NITROSTAT) 0.4 MG SL tablet Place 1 tablet (0.4 mg total) under the tongue every 5 (five) minutes as needed for chest pain. 25 tablet 1   omeprazole (PRILOSEC) 40 MG capsule TAKE 1 CAPSULE EVERY DAY 90 capsule 3   rosuvastatin (CRESTOR) 20 MG tablet TAKE 1 TABLET EVERY DAY 90 tablet 3   sildenafil (VIAGRA) 100 MG tablet TAKE 1/2 TO 1 TABLET EVERY DAY 30-60 MIN PRIOR TO INTERCOURSE AS NEEDED FOR ERECTILE DYSFUNCTION AS DIRECTED (Patient taking differently: Take 50 mg by mouth as needed for erectile dysfunction.) 6 tablet 11   tamsulosin (FLOMAX) 0.4 MG CAPS capsule TAKE 2 CAPSULES EVERY DAY 180 capsule 3   tiZANidine (ZANAFLEX) 4 MG tablet TAKE 1 TABLET EVERY 6  HOURS AS NEEDED FOR MUSCLE SPASM(S) (Patient taking differently: Take 4 mg by mouth every 8 (eight) hours as needed for muscle spasms.) 90 tablet 5   Vitamin D, Ergocalciferol, (DRISDOL) 1.25 MG (50000 UNIT) CAPS capsule TAKE 1 CAPSULE EVERY SUNDAY. (Patient taking differently: Take 50,000 Units by mouth every 7 (seven) days.) 12 capsule 3   No facility-administered medications prior to visit.    No Known Allergies  Review of Systems  Constitutional:  Negative for chills, fatigue and fever.  HENT:  Positive for nosebleeds and postnasal drip. Negative for congestion, ear pain and sinus pain.   Respiratory:  Negative for cough and shortness of breath.   Cardiovascular:  Negative for chest pain.  Gastrointestinal:  Negative for abdominal pain, constipation, diarrhea, nausea and vomiting.  Musculoskeletal:  Negative for myalgias.  Neurological:  Negative for headaches.       Objective:        05/26/2023   10:20 AM 04/19/2023   10:36 AM 03/28/2023   10:28 AM  Vitals with BMI  Height 5\' 10"  5\' 10"  5\' 10"   Weight 216 lbs 223 lbs 219 lbs  BMI 30.99 32 31.42  Systolic 128 118 295  Diastolic 86 72 74  Pulse 64 64 61    No data found.   Physical Exam Vitals and nursing note reviewed.  Constitutional:      Appearance: He is obese.  HENT:     Head: Normocephalic and atraumatic.     Right Ear: Tympanic membrane normal.     Nose: Congestion present. No rhinorrhea.     Comments: Slightly boggy and erythematous turbinates but no scabs or dried blood noted in the nasal passages,    Mouth/Throat:     Mouth: Mucous membranes are moist.     Comments: Post nasal drainage noted Eyes:     Comments: Left pupil constricted and reacting Right pupil dilated and non reactive  Cardiovascular:     Rate and Rhythm: Normal rate and regular rhythm.  Pulmonary:     Effort: Pulmonary effort is normal.     Breath sounds: Normal breath sounds.  Musculoskeletal:        General: Normal range of  motion.  Cervical back: Normal range of motion.  Neurological:     Mental Status: He is alert.     Health Maintenance Due  Topic Date Due   Colonoscopy  01/14/2023    There are no preventive care reminders to display for this patient.   Lab Results  Component Value Date   TSH 3.700 05/17/2023   Lab Results  Component Value Date   WBC 3.6 04/19/2023   HGB 12.0 (L) 04/19/2023   HCT 38.4 04/19/2023   MCV 83 04/19/2023   PLT 159 04/19/2023   Lab Results  Component Value Date   NA 142 04/19/2023   K 4.5 04/19/2023   CO2 26 04/19/2023   GLUCOSE 82 04/19/2023   BUN 8 04/19/2023   CREATININE 1.21 04/19/2023   BILITOT 0.7 04/19/2023   ALKPHOS 58 04/19/2023   AST 33 04/19/2023   ALT 20 04/19/2023   PROT 6.7 04/19/2023   ALBUMIN 4.2 04/19/2023   CALCIUM 9.1 04/19/2023   ANIONGAP 8 10/01/2022   EGFR 65 04/19/2023   Lab Results  Component Value Date   CHOL 77 (L) 04/19/2023   Lab Results  Component Value Date   HDL 23 (L) 04/19/2023   Lab Results  Component Value Date   LDLCALC 41 04/19/2023   Lab Results  Component Value Date   TRIG 53 04/19/2023   Lab Results  Component Value Date   CHOLHDL 3.3 04/19/2023   No results found for: "HGBA1C"     Assessment & Plan:  Nasal congestion  Bleeding from the nose Assessment & Plan: Nasal Congestion with Epistaxis Presents with nasal congestion and epistaxis, primarily affecting the right nostril, ongoing for a couple of weeks. Blood noted upon nose blowing, likely due to dryness and scabbing. Nasal passages congested with post-nasal drainage. No spontaneous epistaxis. Takes Claritin for allergies. Examination reveals congestion and redness, particularly on the left side, without deviated septum or significant bleeding. Likely due to allergic rhinitis with associated dryness causing epistaxis. - Prescribed Flonase nasal spray, one squirt in each nostril daily for two weeks. - Recommend Ocean nasal saline spray,  to be used in both nostrils every evening and as needed to maintain moisture. - Advise against blowing the nose too hard to prevent dislodging blood clots and scabs. - Monitor symptoms over the next couple of weeks and follow up if symptoms persist or worsen.  Pupillary Abnormality Exhibits a pupillary abnormality with the right pupil dilated and nonreactive, while the left pupil is constricted and reactive. Undergoing eye treatment, including cataract management, with a history of right eye damage. Visual acuity is 20/20 in the left eye and 20/40 in the right eye. Cause of the pupillary abnormality not fully assessed in this visit. - Monitor the pupillary abnormality in the context of ongoing eye treatment.  Cognitive Concerns Wife expressed concerns about memory, suggesting a need for cognitive assessment. A small memory test was conducted a couple of months ago. Plan to address cognitive concerns in a future visit if he persists. - Plan for cognitive assessment at a future visit if concerns persist.   Other orders -     Fluticasone Propionate; Place 2 sprays into both nostrils daily.  Dispense: 16 g; Refill: 1     Assessment and Plan       Meds ordered this encounter  Medications   fluticasone (FLONASE) 50 MCG/ACT nasal spray    Sig: Place 2 sprays into both nostrils daily.    Dispense:  16 g  Refill:  1    No orders of the defined types were placed in this encounter.    Follow-up: Return if symptoms worsen or fail to improve.  An After Visit Summary was printed and given to the patient.  Windell Moment, MD [facility] [phone]

## 2023-05-26 NOTE — Patient Instructions (Addendum)
 Try OCEAN NASAL SALINE SPRAY few times through the day to keep the nasal passages moist. Sent FLONASE NASAL SPRAY to use once daily Do not blow your nose hard Let us know how it goes    VISIT SUMMARY:  Today, you were seen for nasal congestion and nosebleeds that have been troubling you for the past couple of weeks. We also discussed your ongoing eye treatment and a pupillary abnormality, as well as some cognitive concerns raised by your wife.  YOUR PLAN:  -NASAL CONGESTION WITH EPISTAXIS: You have been experiencing nasal congestion and nosebleeds, mainly in your right nostril, likely due to allergies and dryness. To help with this, I have prescribed Flonase nasal spray to be used once daily in each nostril for two weeks and Ocean nasal saline spray to keep your nasal passages moist. Please avoid blowing your nose too hard to prevent further bleeding. Monitor your symptoms and follow up if they do not improve or get worse.  -PUPILLARY ABNORMALITY: Your right pupil is dilated and nonreactive, while your left pupil is constricted and reactive. This is likely related to your ongoing eye treatment and previous damage to your right eye. We will continue to monitor this as part of your eye care.  -COGNITIVE CONCERNS: Your wife has noticed some memory issues, and although a small memory test was done a couple of months ago, we will plan for a more detailed cognitive assessment in a future visit if these concerns continue.  INSTRUCTIONS:  Please use the Flonase nasal spray once daily in each nostril for two weeks and the Ocean nasal saline spray every evening and as needed. Avoid blowing your nose too hard. Monitor your symptoms and follow up if they do not improve or worsen. We will continue to monitor your pupillary abnormality as part of your eye care. If memory concerns persist, we will plan for a cognitive assessment in a future visit.

## 2023-05-27 NOTE — Assessment & Plan Note (Signed)
 Nasal Congestion with Epistaxis Presents with nasal congestion and epistaxis, primarily affecting the right nostril, ongoing for a couple of weeks. Blood noted upon nose blowing, likely due to dryness and scabbing. Nasal passages congested with post-nasal drainage. No spontaneous epistaxis. Takes Claritin for allergies. Examination reveals congestion and redness, particularly on the left side, without deviated septum or significant bleeding. Likely due to allergic rhinitis with associated dryness causing epistaxis. - Prescribed Flonase nasal spray, one squirt in each nostril daily for two weeks. - Recommend Ocean nasal saline spray, to be used in both nostrils every evening and as needed to maintain moisture. - Advise against blowing the nose too hard to prevent dislodging blood clots and scabs. - Monitor symptoms over the next couple of weeks and follow up if symptoms persist or worsen.  Pupillary Abnormality Exhibits a pupillary abnormality with the right pupil dilated and nonreactive, while the left pupil is constricted and reactive. Undergoing eye treatment, including cataract management, with a history of right eye damage. Visual acuity is 20/20 in the left eye and 20/40 in the right eye. Cause of the pupillary abnormality not fully assessed in this visit. - Monitor the pupillary abnormality in the context of ongoing eye treatment.  Cognitive Concerns Wife expressed concerns about memory, suggesting a need for cognitive assessment. A small memory test was conducted a couple of months ago. Plan to address cognitive concerns in a future visit if he persists. - Plan for cognitive assessment at a future visit if concerns persist.

## 2023-05-31 ENCOUNTER — Ambulatory Visit

## 2023-06-01 ENCOUNTER — Ambulatory Visit: Payer: Medicare PPO | Admitting: Cardiology

## 2023-06-27 ENCOUNTER — Other Ambulatory Visit: Payer: Self-pay | Admitting: Cardiology

## 2023-06-27 ENCOUNTER — Ambulatory Visit: Payer: Medicare PPO | Admitting: Podiatry

## 2023-06-30 ENCOUNTER — Ambulatory Visit (INDEPENDENT_AMBULATORY_CARE_PROVIDER_SITE_OTHER)

## 2023-06-30 DIAGNOSIS — E291 Testicular hypofunction: Secondary | ICD-10-CM

## 2023-06-30 MED ORDER — TESTOSTERONE CYPIONATE 200 MG/ML IM SOLN
200.0000 mg | Freq: Once | INTRAMUSCULAR | Status: AC
  Administered 2023-06-30: 200 mg via INTRAMUSCULAR

## 2023-06-30 NOTE — Progress Notes (Signed)
 Patient is in office today for a nurse visit for Testosterone Injection. Patient Injection was given in the  Right upper quad. gluteus. Patient tolerated injection well.

## 2023-07-07 ENCOUNTER — Encounter (HOSPITAL_COMMUNITY): Payer: Self-pay

## 2023-07-07 ENCOUNTER — Other Ambulatory Visit: Payer: Self-pay

## 2023-07-07 MED ORDER — FLUTICASONE PROPIONATE 50 MCG/ACT NA SUSP: 2.0000 | Freq: Every day | NASAL | 1 refills | Status: DC

## 2023-07-14 ENCOUNTER — Ambulatory Visit (INDEPENDENT_AMBULATORY_CARE_PROVIDER_SITE_OTHER)

## 2023-07-14 DIAGNOSIS — E291 Testicular hypofunction: Secondary | ICD-10-CM | POA: Diagnosis not present

## 2023-07-14 MED ORDER — TESTOSTERONE CYPIONATE 200 MG/ML IM SOLN
200.0000 mg | Freq: Once | INTRAMUSCULAR | Status: AC
  Administered 2023-07-14: 200 mg via INTRAMUSCULAR

## 2023-07-14 NOTE — Progress Notes (Signed)
 Patient presents today for testosterone  injection. Injection given in right buttocks, see MAR for additional info. Patient to return in 14 days for next injection.

## 2023-07-21 ENCOUNTER — Other Ambulatory Visit: Payer: Self-pay

## 2023-07-27 ENCOUNTER — Other Ambulatory Visit: Payer: Self-pay | Admitting: Family Medicine

## 2023-07-28 ENCOUNTER — Ambulatory Visit (INDEPENDENT_AMBULATORY_CARE_PROVIDER_SITE_OTHER)

## 2023-07-28 DIAGNOSIS — E291 Testicular hypofunction: Secondary | ICD-10-CM

## 2023-07-28 MED ORDER — TESTOSTERONE CYPIONATE 200 MG/ML IM SOLN
200.0000 mg | Freq: Once | INTRAMUSCULAR | Status: AC
  Administered 2023-07-28: 200 mg via INTRAMUSCULAR

## 2023-07-28 NOTE — Progress Notes (Signed)
 Patient is in office today for a nurse visit for Testosterone Injection. Patient Injection was given in the  Right upper quad. gluteus. Patient tolerated injection well.

## 2023-08-03 ENCOUNTER — Other Ambulatory Visit: Payer: Self-pay | Admitting: Family Medicine

## 2023-08-03 DIAGNOSIS — N401 Enlarged prostate with lower urinary tract symptoms: Secondary | ICD-10-CM

## 2023-08-18 ENCOUNTER — Ambulatory Visit (INDEPENDENT_AMBULATORY_CARE_PROVIDER_SITE_OTHER)

## 2023-08-18 DIAGNOSIS — E291 Testicular hypofunction: Secondary | ICD-10-CM | POA: Diagnosis not present

## 2023-08-18 MED ORDER — TESTOSTERONE CYPIONATE 200 MG/ML IM SOLN
200.0000 mg | Freq: Once | INTRAMUSCULAR | Status: AC
  Administered 2023-08-18: 200 mg via INTRAMUSCULAR

## 2023-08-18 NOTE — Progress Notes (Cosign Needed)
 Patient is in office today for a nurse visit for Testosterone Injection. Patient Injection was given in the  Right upper quad. gluteus. Patient tolerated injection well.

## 2023-09-03 ENCOUNTER — Other Ambulatory Visit: Payer: Self-pay

## 2023-09-07 ENCOUNTER — Ambulatory Visit

## 2023-09-07 DIAGNOSIS — E291 Testicular hypofunction: Secondary | ICD-10-CM | POA: Diagnosis not present

## 2023-09-07 MED ORDER — TESTOSTERONE CYPIONATE 200 MG/ML IM SOLN
200.0000 mg | Freq: Once | INTRAMUSCULAR | Status: AC
  Administered 2023-09-07: 200 mg via INTRAMUSCULAR

## 2023-09-07 NOTE — Progress Notes (Signed)
 Patient is in office today for a nurse visit for Testosterone Injection. Patient Injection was given in the  Right upper quad. gluteus. Patient tolerated injection well.

## 2023-09-09 ENCOUNTER — Other Ambulatory Visit: Payer: Self-pay | Admitting: Family Medicine

## 2023-09-16 ENCOUNTER — Ambulatory Visit: Attending: Cardiology | Admitting: Cardiology

## 2023-09-16 ENCOUNTER — Encounter: Payer: Self-pay | Admitting: Cardiology

## 2023-09-16 VITALS — BP 120/76 | HR 73 | Ht 70.0 in | Wt 208.0 lb

## 2023-09-16 DIAGNOSIS — I739 Peripheral vascular disease, unspecified: Secondary | ICD-10-CM | POA: Diagnosis not present

## 2023-09-16 DIAGNOSIS — I2510 Atherosclerotic heart disease of native coronary artery without angina pectoris: Secondary | ICD-10-CM | POA: Diagnosis not present

## 2023-09-16 DIAGNOSIS — I5083 High output heart failure: Secondary | ICD-10-CM

## 2023-09-16 DIAGNOSIS — I130 Hypertensive heart and chronic kidney disease with heart failure and stage 1 through stage 4 chronic kidney disease, or unspecified chronic kidney disease: Secondary | ICD-10-CM | POA: Diagnosis not present

## 2023-09-16 NOTE — Progress Notes (Signed)
 Cardiology Office Note:    Date:  09/16/2023   ID:  Mark Mejia, DOB August 27, 1955, MRN [id number]  PCP:  Sherre Clapper, MD  Cardiologist:  Lamar Fitch, MD    Referring MD: Sherre Clapper, MD   Chief Complaint  Patient presents with   Follow-up    History of Present Illness:    Mark Mejia is a 68 y.o. 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  Comes today to my office for follow-up.  Overall doing well he discovered recently tiny send start playing on the regular basis he is enjoying it.  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   Chest pain of uncertain etiology 05/25/2017   Encounter for adjustment and management of vascular access device 03/10/2016   Encounter for annual wellness exam in Medicare patient 03/10/2016   GERD (gastroesophageal reflux disease)    Hepatitis C    History of esophageal cancer    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   Hypertrophic scar of skin 09/29/2016   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   Mixed hyperlipidemia    Peripheral vascular disease, asymptomatic (HCC) 05/23/2017   80-99% stenosis in the left internal coronary artery   Precordial chest pain 12/26/2015   Shortness of breath 12/26/2015   Testicular hypofunction    Vitamin D  deficiency     Past Surgical History:  Procedure Laterality Date   GASTROSTOMY W/ FEEDING TUBE     LEFT HEART CATH AND CORONARY ANGIOGRAPHY N/A 05/25/2017    Procedure: LEFT HEART CATH AND CORONARY ANGIOGRAPHY;  Surgeon: Swaziland, Peter M, MD;  Location: [facility] INVASIVE CV [facility];  Service: Cardiovascular;  Laterality: N/A;   PORTACATH PLACEMENT     portacath removal     THROAT SURGERY      Current Medications: Current Meds  Medication Sig   amLODipine  (NORVASC ) 5 MG tablet TAKE 1 TABLET EVERY DAY   aspirin  EC 81 MG tablet Take 81 mg by mouth daily.    ciclopirox  (PENLAC ) 8 % solution Apply topically at bedtime. Apply over nail and surrounding skin. Apply daily over previous coat. After seven (7) days, may remove with alcohol and continue cycle.   dorzolamide-timolol (COSOPT) 22.3-6.8 MG/ML ophthalmic solution Place 1 drop into the right eye 2 (two) times daily.   eszopiclone  (LUNESTA ) 2 MG TABS tablet Take 1 tablet (2 mg total) by mouth at bedtime as needed for sleep.   famotidine  (PEPCID ) 40 MG tablet Take 40 mg by mouth daily.   fluticasone  (FLONASE ) 50 MCG/ACT nasal spray PLACE 2 SPRAYS INTO BOTH NOSTRILS DAILY.   furosemide  (LASIX ) 20 MG tablet TAKE 1 TABLET EVERY DAY   Glycerin-Hypromellose-PEG 400 ([facility] DRY EYE RELIEF OP) Place 2 drops into both eyes daily as needed (for dry eyes).   HYDROcodone -acetaminophen  (NORCO/VICODIN) 5-325 MG tablet Take 1 tablet by mouth every 6 (six) hours as needed for moderate pain.   ketoconazole  (NIZORAL ) 2 % cream Apply 1 Application topically daily.   latanoprost (XALATAN) 0.005 % ophthalmic  solution Place 1 drop into the right eye at bedtime.   levothyroxine  (SYNTHROID ) 88 MCG tablet TAKE 1 TABLET EVERY DAY BEFORE BREAKFAST   meloxicam  (MOBIC ) 7.5 MG tablet TAKE 1 TABLET TWICE DAILY (Patient taking differently: Take 7.5 mg by mouth 2 (two) times daily. TAKE 1 TABLET TWICE DAILY)   metoprolol  tartrate (LOPRESSOR ) 25 MG tablet Take 1 tablet (25 mg total) by mouth 2 (two) times daily.   nitroGLYCERIN  (NITROSTAT ) 0.4 MG SL tablet Place 1 tablet (0.4 mg total) under the tongue every 5 (five) minutes as needed for chest  pain.   omeprazole  (PRILOSEC) 40 MG capsule TAKE 1 CAPSULE EVERY DAY   rosuvastatin  (CRESTOR ) 20 MG tablet TAKE 1 TABLET EVERY DAY   sildenafil  (VIAGRA ) 100 MG tablet TAKE 1/2 TO 1 TABLET EVERY DAY 30-60 MIN PRIOR TO INTERCOURSE AS NEEDED FOR ERECTILE DYSFUNCTION AS DIRECTED   tamsulosin  (FLOMAX ) 0.4 MG CAPS capsule TAKE 2 CAPSULES EVERY DAY   testosterone  cypionate (DEPOTESTOSTERONE CYPIONATE) 200 MG/ML injection INJECT 1ML (200 MG) AS DIRECTED EVERY 14 DAYS -- VIAL IS FOR SINGLE USE ONLY   tiZANidine  (ZANAFLEX ) 4 MG tablet TAKE 1 TABLET EVERY 6 HOURS AS NEEDED FOR MUSCLE SPASM(S) (Patient taking differently: Take 4 mg by mouth every 8 (eight) hours as needed for muscle spasms.)   Vitamin D , Ergocalciferol , (DRISDOL ) 1.25 MG (50000 UNIT) CAPS capsule TAKE 1 CAPSULE EVERY SUNDAY. (Patient taking differently: Take 50,000 Units by mouth every 7 (seven) days.)     Allergies:   Patient has no known allergies.   Social History   Socioeconomic History   Marital status: Married    Spouse name: LAURA   Number of children: 1   Years of education: 12 + 2   Highest education level: Not on file  Occupational History   Occupation: Retired  Tobacco Use   Smoking status: Never   Smokeless tobacco: Never  Vaping Use   Vaping status: Never Used  Substance and Sexual Activity   Alcohol use: No   Drug use: No   Sexual activity: Yes  Other Topics Concern   Not on file  Social History Narrative   Currently retired/disabled   Social Drivers of Health   Financial Resource Strain: Patient Declined (05/25/2023)   Overall Financial Resource Strain (CARDIA)    Difficulty of Paying Living Expenses: Patient declined  Food Insecurity: Patient Declined (05/25/2023)   Hunger Vital Sign    Worried About Running Out of Food in the Last Year: Patient declined    Ran Out of Food in the Last Year: Patient declined  Transportation Needs: Patient Declined (05/25/2023)   PRAPARE - Scientist, research (physical sciences) (Medical): Patient declined    Lack of Transportation (Non-Medical): Patient declined  Physical Activity: Sufficiently Active (05/25/2023)   Exercise Vital Sign    Days of Exercise per Week: 4 days    Minutes of Exercise per Session: 60 min  Stress: No Stress Concern Present (05/25/2023)   [employer] of Occupational Health - Occupational Stress Questionnaire    Feeling of Stress : Not at all  Social Connections: Socially Integrated (05/25/2023)   Social Connection and Isolation Panel    Frequency of Communication with Friends and Family: More than three times a week    Frequency of Social Gatherings with Friends and Family: Once a week    Attends Religious Services: More than 4 times per year    Active Member of [employer] or Organizations: Yes    Attends [employer]  or Organization Meetings: More than 4 times per year    Marital Status: Married     Family History: The patient's family history includes Arthritis in his mother; Cancer - Other in his maternal grandmother; Hypertension in his brother and mother. ROS:   Please see the history of present illness.    All 14 point review of systems negative except as described per history of present illness  EKGs/Labs/Other Studies Reviewed:         Recent Labs: 04/19/2023: ALT 20; BUN 8; Creatinine, Ser 1.21; Hemoglobin 12.0; Platelets 159; Potassium 4.5; Sodium 142 05/17/2023: TSH 3.700  Recent Lipid Panel    Component Value Date/Time   CHOL 77 (L) 04/19/2023 1110   TRIG 53 04/19/2023 1110   HDL 23 (L) 04/19/2023 1110   CHOLHDL 3.3 04/19/2023 1110   LDLCALC 41 04/19/2023 1110    Physical Exam:    VS:  BP 120/76 (BP Location: Right Arm, Patient Position: Sitting)   Pulse 73   Ht 5' 10 (1.778 m)   Wt 208 lb (94.3 kg)   SpO2 96%   BMI 29.84 kg/m     Wt Readings from Last 3 Encounters:  09/16/23 208 lb (94.3 kg)  05/26/23 216 lb (98 kg)  04/19/23 223 lb (101.2 kg)     GEN:  Well nourished, well developed in no  acute distress HEENT: Normal NECK: No JVD; No carotid bruits LYMPHATICS: No lymphadenopathy CARDIAC: RRR, no murmurs, no rubs, no gallops RESPIRATORY:  Clear to auscultation without rales, wheezing or rhonchi  ABDOMEN: Soft, non-tender, non-distended MUSCULOSKELETAL:  No edema; No deformity  SKIN: Warm and dry LOWER EXTREMITIES: no swelling NEUROLOGIC:  Alert and oriented x 3 PSYCHIATRIC:  Normal affect   ASSESSMENT:    1. 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 Coronary artery disease involving native coronary artery of native heart without angina pectoris   3. Peripheral vascular disease, unspecified (HCC)    PLAN:    In order of problems listed above:  Coronary artery disease stable from that point to the minimal disease on appropriate guideline directed medical therapy. Peripheral vascular disease stable continue management. Dyslipidemia I did review K PN his LDL 41 HDL 29 he is on Crestor  20 which I will continue. Erectile dysfunction on Viagra .   Medication Adjustments/Labs and Tests Ordered: Current medicines are reviewed at length with the patient today.  Concerns regarding medicines are outlined above.  No orders of the defined types were placed in this encounter.  Medication changes: No orders of the defined types were placed in this encounter.   Signed, Lamar DOROTHA Fitch, MD, [facility] 09/16/2023 4:37 PM    [facility]

## 2023-09-16 NOTE — Patient Instructions (Signed)

## 2023-09-19 ENCOUNTER — Other Ambulatory Visit: Payer: Self-pay | Admitting: Family Medicine

## 2023-09-24 ENCOUNTER — Other Ambulatory Visit: Payer: Self-pay | Admitting: Family Medicine

## 2023-09-28 ENCOUNTER — Ambulatory Visit

## 2023-10-05 ENCOUNTER — Ambulatory Visit (INDEPENDENT_AMBULATORY_CARE_PROVIDER_SITE_OTHER)

## 2023-10-05 DIAGNOSIS — H524 Presbyopia: Secondary | ICD-10-CM | POA: Diagnosis not present

## 2023-10-05 DIAGNOSIS — E291 Testicular hypofunction: Secondary | ICD-10-CM

## 2023-10-05 DIAGNOSIS — H4031X0 Glaucoma secondary to eye trauma, right eye, stage unspecified: Secondary | ICD-10-CM | POA: Diagnosis not present

## 2023-10-05 MED ORDER — TESTOSTERONE CYPIONATE 200 MG/ML IM SOLN
200.0000 mg | Freq: Once | INTRAMUSCULAR | Status: AC
  Administered 2023-10-05: 200 mg via INTRAMUSCULAR

## 2023-10-05 NOTE — Progress Notes (Signed)
 Patient is here for testosterone  injection. He tolerates well injection.

## 2023-10-17 NOTE — Progress Notes (Signed)
 Subjective:  Patient ID: Mark Mejia, male    DOB: Jul 21, 1955  Age: 68 y.o. MRN: [id number]  Chief Complaint  Patient presents with   Medical Management of Chronic Issues    HPI:  Hypertension: Checking bp daily. 155-160/80-86 in the mornings and 120's /70 's 2 hours after taking medications . On aspirin  81 mg daily, Metoprolol  25 mg twice a day, and amlodipine  5 mg as needed for SBP higher than 150. - Lasix  20 mg daily with no reported swelling   Hypothyroidism: Takes Levothyroxine  88 mcg daily before breakast   Hyperlipidemia: Rosuvastatin  20 mg daily before bed   GERD: Omeprazole  40 mg daily in the afternoon, Pepcid  40 mg qhs   Hypogonadism male: Testosterone  cypionate 200 mg every 14 days   BPH: Tamsulosin  0.4 mg take 2 tablets twice a day.   Insomnia: Lunesta  2 mg before bed as needed - Difficulty initiating and maintaining sleep, averaging approximately two hours of sleep per night - Occasional nights with no sleep, followed by a longer sleep period after about five days - Bedtime between 10:00 PM and 11:30 PM; wakes around 6:45 AM - Nocturia occurs once or twice nightly, disrupting sleep and causing difficulty returning to sleep - Lunesta  is ineffective, requiring approximately three hours to fall asleep and not maintaining sleep - No use of other sleep medications - No caffeine intake close to bedtime and does not drink coffee regularly - Physically active, including activities such as cutting grass, but still experiences difficulty sleeping even when fatigued  Nocturia - Nocturia occurs once or twice per night, contributing to sleep disruption - Difficulty falling back asleep after nocturnal awakenings  Absence of associated symptoms - No chest pain - No breathing problems - No abdominal pain - No depression, sadness, or anxiety          04/19/2023   10:38 AM 12/01/2022    3:30 PM 10/18/2022   10:06 AM 05/06/2022   11:25 AM 02/16/2022    3:11 PM  Depression  screen PHQ 2/9  Decreased Interest 0 0 0 0 0  Down, Depressed, Hopeless 0 0 0 0 0  PHQ - 2 Score 0 0 0 0 0  Altered sleeping 1 2 3     Tired, decreased energy 0 0 0    Change in appetite 0 0 0    Feeling bad or failure about yourself  0 0 0    Trouble concentrating 0 0 0    Moving slowly or fidgety/restless 0 0 0    Suicidal thoughts 0 0 0    PHQ-9 Score 1 2 3     Difficult doing work/chores Not difficult at all Not difficult at all Not difficult at all          10/18/2023    9:13 AM  Fall Risk   Falls in the past year? 0  Number falls in past yr: 0  Injury with Fall? 0  Risk for fall due to : No Fall Risks  Follow up Falls evaluation completed    Patient Care Team: Sherre Clapper, MD as PCP - General (Internal Medicine) Bernie Lamar PARAS, MD as Consulting Physician (Cardiology) Misenheimer, Evalene, MD as Consulting Physician (Unknown Physician Specialty)   Review of Systems  Constitutional:  Negative for chills, diaphoresis, fatigue and fever.  HENT:  Negative for congestion, ear pain and sore throat.   Respiratory:  Negative for cough and shortness of breath.   Cardiovascular:  Negative for chest pain and leg swelling.  Gastrointestinal:  Negative for abdominal pain, constipation, diarrhea, nausea and vomiting.  Genitourinary:  Negative for dysuria and urgency.  Musculoskeletal:  Positive for arthralgias. Negative for myalgias.  Neurological:  Negative for dizziness and headaches.  Psychiatric/Behavioral:  Negative for dysphoric mood.     Current Outpatient Medications on File Prior to Visit  Medication Sig Dispense Refill   amLODipine  (NORVASC ) 5 MG tablet TAKE 1 TABLET EVERY DAY 90 tablet 3   aspirin  EC 81 MG tablet Take 81 mg by mouth daily.      ciclopirox  (PENLAC ) 8 % solution Apply topically at bedtime. Apply over nail and surrounding skin. Apply daily over previous coat. After seven (7) days, may remove with alcohol and continue cycle. 6.6 mL 0    dorzolamide-timolol (COSOPT) 22.3-6.8 MG/ML ophthalmic solution Place 1 drop into the right eye 2 (two) times daily.     eszopiclone  (LUNESTA ) 2 MG TABS tablet Take 1 tablet (2 mg total) by mouth at bedtime as needed for sleep. 30 tablet 5   famotidine  (PEPCID ) 40 MG tablet Take 40 mg by mouth daily.     fluticasone  (FLONASE ) 50 MCG/ACT nasal spray PLACE 2 SPRAYS INTO BOTH NOSTRILS DAILY. 32 g 5   furosemide  (LASIX ) 20 MG tablet TAKE 1 TABLET EVERY DAY 90 tablet 3   Glycerin-Hypromellose-PEG 400 ([facility] DRY EYE RELIEF OP) Place 2 drops into both eyes daily as needed (for dry eyes).     HYDROcodone -acetaminophen  (NORCO/VICODIN) 5-325 MG tablet Take 1 tablet by mouth every 6 (six) hours as needed for moderate pain. 30 tablet 0   ketoconazole  (NIZORAL ) 2 % cream Apply 1 Application topically daily. 30 g 0   latanoprost (XALATAN) 0.005 % ophthalmic solution Place 1 drop into the right eye at bedtime.     meloxicam  (MOBIC ) 7.5 MG tablet TAKE 1 TABLET TWICE DAILY 180 tablet 1   metoprolol  tartrate (LOPRESSOR ) 25 MG tablet Take 1 tablet (25 mg total) by mouth 2 (two) times daily. 180 tablet 2   nitroGLYCERIN  (NITROSTAT ) 0.4 MG SL tablet Place 1 tablet (0.4 mg total) under the tongue every 5 (five) minutes as needed for chest pain. 25 tablet 1   omeprazole  (PRILOSEC) 40 MG capsule TAKE 1 CAPSULE EVERY DAY 90 capsule 3   rosuvastatin  (CRESTOR ) 20 MG tablet TAKE 1 TABLET EVERY DAY 90 tablet 3   sildenafil  (VIAGRA ) 100 MG tablet TAKE 1/2 TO 1 TABLET EVERY DAY 30-60 MIN PRIOR TO INTERCOURSE AS NEEDED FOR ERECTILE DYSFUNCTION AS DIRECTED 6 tablet 3   tamsulosin  (FLOMAX ) 0.4 MG CAPS capsule TAKE 2 CAPSULES EVERY DAY 180 capsule 3   testosterone  cypionate (DEPOTESTOSTERONE CYPIONATE) 200 MG/ML injection INJECT 1ML (200 MG) AS DIRECTED EVERY 14 DAYS -- VIAL IS FOR SINGLE USE ONLY 6 mL 0   tiZANidine  (ZANAFLEX ) 4 MG tablet Take 1 tablet (4 mg total) by mouth every 8 (eight) hours as needed for muscle spasms. 90 tablet  1   Vitamin D , Ergocalciferol , (DRISDOL ) 1.25 MG (50000 UNIT) CAPS capsule TAKE 1 CAPSULE EVERY SUNDAY. (Patient taking differently: Take 50,000 Units by mouth every 7 (seven) days.) 12 capsule 3   No current facility-administered medications on file prior to visit.   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   Hypertrophic scar of skin 09/29/2016   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   Mixed hyperlipidemia    Peripheral vascular disease, asymptomatic (HCC) 05/23/2017   80-99% stenosis in the left internal coronary artery   Precordial chest pain 12/26/2015   Shortness of breath 12/26/2015   Testicular hypofunction    Vitamin D  deficiency    Past Surgical History:  Procedure Laterality Date   GASTROSTOMY W/ FEEDING TUBE     LEFT HEART CATH AND CORONARY ANGIOGRAPHY N/A 05/25/2017   Procedure: LEFT HEART CATH AND CORONARY ANGIOGRAPHY;  Surgeon: Swaziland, Peter M, MD;  Location: [facility] INVASIVE CV LAB;  Service: Cardiovascular;  Laterality: N/A;   PORTACATH PLACEMENT     portacath removal     THROAT SURGERY      Family History  Problem Relation Age of Onset   Arthritis Mother    Hypertension Mother    Hypertension Brother    Cancer - Other Maternal Grandmother    Social History   Socioeconomic History   Marital status: Married    Spouse name: LAURA   Number of children: 1   Years of education:  12 + 2   Highest education level: Not on file  Occupational History   Occupation: Retired  Tobacco Use   Smoking status: Never   Smokeless tobacco: Never  Vaping Use   Vaping status: Never Used  Substance and Sexual Activity   Alcohol use: No   Drug use: No   Sexual activity: Yes  Other Topics Concern   Not on file  Social History Narrative   Currently retired/disabled   Social Drivers of Health   Financial Resource Strain: Patient Declined (10/15/2023)   Overall Financial Resource Strain (CARDIA)    Difficulty of Paying Living Expenses: Patient declined  Food Insecurity: Patient Declined (10/15/2023)   Hunger Vital Sign    Worried About Running Out of Food in the Last Year: Patient declined    Ran Out of Food in the Last Year: Patient declined  Transportation Needs: No Transportation Needs (10/15/2023)   PRAPARE - Administrator, Civil Service (Medical): No    Lack of Transportation (Non-Medical): No  Physical Activity: Sufficiently Active (10/15/2023)   Exercise Vital Sign    Days of Exercise per Week: 3 days    Minutes of Exercise per Session: 60 min  Stress: No Stress Concern Present (10/15/2023)   [employer] of Occupational Health - Occupational Stress Questionnaire    Feeling of Stress: Not at all  Social Connections: Socially Integrated (10/15/2023)   Social Connection and Isolation Panel    Frequency of Communication with Friends and Family: More than three times a week    Frequency of Social Gatherings with Friends and Family: Twice a week    Attends Religious Services: More than 4 times per year    Active Member of [employer] or Organizations: Yes    Attends Engineer, structural: More than 4 times per year    Marital Status: Married    Objective:  BP 112/68   Pulse (!) 57   Temp 98.2 F (36.8 C)   Ht 5' 10 (1.778 m)  Wt 205 lb (93 kg)   SpO2 98%   BMI 29.41 kg/m      10/18/2023    9:11 AM 09/16/2023    4:19 PM 05/26/2023   10:20  AM  BP/Weight  Systolic BP 112 120 128  Diastolic BP 68 76 86  Wt. (Lbs) 205 208 216  BMI 29.41 kg/m2 29.84 kg/m2 30.99 kg/m2    Physical Exam Vitals reviewed.  Constitutional:      Appearance: Normal appearance.  Neck:     Vascular: No carotid bruit.  Cardiovascular:     Rate and Rhythm: Regular rhythm.     Pulses: Normal pulses.     Heart sounds: Normal heart sounds.  Pulmonary:     Effort: Pulmonary effort is normal.     Breath sounds: Normal breath sounds. No wheezing, rhonchi or rales.  Abdominal:     General: Bowel sounds are normal.     Palpations: Abdomen is soft.     Tenderness: There is no abdominal tenderness.  Neurological:     Mental Status: He is alert and oriented to person, place, and time.  Psychiatric:        Mood and Affect: Mood normal.        Behavior: Behavior normal.         Lab Results  Component Value Date   WBC 4.6 10/18/2023   HGB 13.3 10/18/2023   HCT 44.2 10/18/2023   PLT 167 10/18/2023   GLUCOSE 75 10/18/2023   CHOL 77 (L) 04/19/2023   TRIG 53 04/19/2023   HDL 23 (L) 04/19/2023   LDLCALC 41 04/19/2023   ALT 25 10/18/2023   AST 36 10/18/2023   NA 141 10/18/2023   K 4.2 10/18/2023   CL 102 10/18/2023   CREATININE 1.22 10/18/2023   BUN 11 10/18/2023   CO2 22 10/18/2023   TSH 7.830 (H) 10/18/2023   INR 1.1 05/23/2017      Assessment & Plan:  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) Assessment & Plan: Blood pressure generally well-controlled with metoprolol  and as-needed amlodipine . - Continue metoprolol  25 mg twice daily. - Continue amlodipine  5 mg as needed based on home blood pressure readings.  Orders: -     Comprehensive metabolic panel with GFR -     CBC with Differential/Platelet  Benign prostatic hyperplasia with nocturia Assessment & Plan: Continue tamsulosin     Primary insomnia Assessment & Plan: Chronic insomnia with ineffective Lunesta  treatment, resulting in minimal sleep and  frequent awakenings. - Discontinue Lunesta . - Initiate trazodone  50 mg, take one tablet 30 minutes before bedtime. - Provided information on good sleep habits. - Instructed to report if trazodone  is ineffective.  Orders: -     traZODone  HCl; Take 1 tablet (50 mg total) by mouth at bedtime as needed for sleep.  Dispense: 30 tablet; Refill: 3  Gastroesophageal reflux disease without esophagitis Assessment & Plan: Continue omeprazole  40 mg daily and pepcid  40 mg at bedtime.   Mixed hyperlipidemia Assessment & Plan: Hyperlipidemia with slightly elevated triglycerides possibly due to diet. - Continue rosuvastatin . - Advise dietary modifications to reduce triglycerides, including reducing fried foods and sugar intake.  Orders: -     POCT Lipid Panel  Atrophy of thyroid  Assessment & Plan: Recommend increase levothyroxine  to 100 mcg once daily in am. Recheck thyroid  function in 6 weeks.   Orders: -     T4, free -     TSH  Male hypogonadism -     Testosterone  Cypionate  Meds ordered this encounter  Medications   testosterone  cypionate (DEPOTESTOSTERONE CYPIONATE) injection 200 mg   traZODone  (DESYREL ) 50 MG tablet    Sig: Take 1 tablet (50 mg total) by mouth at bedtime as needed for sleep.    Dispense:  30 tablet    Refill:  3    Orders Placed This Encounter  Procedures   Comprehensive metabolic panel   CBC with Differential/Platelet   T4, free   TSH   POCT Lipid Panel    Follow-up: Return in about 3 months (around 01/18/2024) for chronic follow up.   I,Katherina A Bramblett,acting as a scribe for Abigail Free, MD.,have documented all relevant documentation on the behalf of Abigail Free, MD,as directed by  Abigail Free, MD while in the presence of Abigail Free, MD.   An After Visit Summary was printed and given to the patient.  I attest that I have reviewed this visit and agree with the plan scribed by my staff.   Abigail Free, MD [facility] [phone]

## 2023-10-18 ENCOUNTER — Ambulatory Visit: Payer: Medicare PPO | Admitting: Family Medicine

## 2023-10-18 VITALS — BP 112/68 | HR 57 | Temp 98.2°F | Ht 70.0 in | Wt 205.0 lb

## 2023-10-18 DIAGNOSIS — K219 Gastro-esophageal reflux disease without esophagitis: Secondary | ICD-10-CM | POA: Diagnosis not present

## 2023-10-18 DIAGNOSIS — E291 Testicular hypofunction: Secondary | ICD-10-CM | POA: Diagnosis not present

## 2023-10-18 DIAGNOSIS — F5101 Primary insomnia: Secondary | ICD-10-CM

## 2023-10-18 DIAGNOSIS — N401 Enlarged prostate with lower urinary tract symptoms: Secondary | ICD-10-CM | POA: Diagnosis not present

## 2023-10-18 DIAGNOSIS — I110 Hypertensive heart disease with heart failure: Secondary | ICD-10-CM

## 2023-10-18 DIAGNOSIS — E034 Atrophy of thyroid (acquired): Secondary | ICD-10-CM

## 2023-10-18 DIAGNOSIS — E782 Mixed hyperlipidemia: Secondary | ICD-10-CM

## 2023-10-18 DIAGNOSIS — R351 Nocturia: Secondary | ICD-10-CM | POA: Diagnosis not present

## 2023-10-18 DIAGNOSIS — I5030 Unspecified diastolic (congestive) heart failure: Secondary | ICD-10-CM

## 2023-10-18 LAB — POCT LIPID PANEL
HDL: 28
TC: <100
TRG: 176

## 2023-10-18 MED ORDER — TESTOSTERONE CYPIONATE 200 MG/ML IM SOLN
200.0000 mg | Freq: Once | INTRAMUSCULAR | Status: AC
  Administered 2023-10-18: 200 mg via INTRAMUSCULAR

## 2023-10-18 MED ORDER — TRAZODONE HCL 50 MG PO TABS: 50.0000 mg | ORAL_TABLET | Freq: Every evening | ORAL | 3 refills | Status: DC | PRN

## 2023-10-18 NOTE — Assessment & Plan Note (Signed)
 Blood pressure generally well-controlled with metoprolol  and as-needed amlodipine . - Continue metoprolol  25 mg twice daily. - Continue amlodipine  5 mg as needed based on home blood pressure readings.

## 2023-10-18 NOTE — Assessment & Plan Note (Signed)
 Chronic insomnia with ineffective Lunesta  treatment, resulting in minimal sleep and frequent awakenings. - Discontinue Lunesta . - Initiate trazodone  50 mg, take one tablet 30 minutes before bedtime. - Provided information on good sleep habits. - Instructed to report if trazodone  is ineffective.

## 2023-10-18 NOTE — Patient Instructions (Addendum)
 Start trazodone  50 mg one before bed.  Stop lunesta .  Recommend shingles vaccine series at your pharmacy.

## 2023-10-18 NOTE — Assessment & Plan Note (Signed)
 Hyperlipidemia with slightly elevated triglycerides possibly due to diet. - Continue rosuvastatin . - Advise dietary modifications to reduce triglycerides, including reducing fried foods and sugar intake.

## 2023-10-19 ENCOUNTER — Ambulatory Visit: Payer: Self-pay | Admitting: Family Medicine

## 2023-10-19 DIAGNOSIS — E039 Hypothyroidism, unspecified: Secondary | ICD-10-CM

## 2023-10-19 LAB — TSH: TSH: 7.83 u[IU]/mL — ABNORMAL HIGH (ref 0.450–4.500)

## 2023-10-19 LAB — COMPREHENSIVE METABOLIC PANEL WITH GFR
ALT: 25 IU/L (ref 0–44)
AST: 36 IU/L (ref 0–40)
Albumin: 4.6 g/dL (ref 3.9–4.9)
Alkaline Phosphatase: 50 IU/L (ref 44–121)
BUN/Creatinine Ratio: 9 — ABNORMAL LOW (ref 10–24)
BUN: 11 mg/dL (ref 8–27)
Bilirubin Total: 0.7 mg/dL (ref 0.0–1.2)
CO2: 22 mmol/L (ref 20–29)
Calcium: 9.3 mg/dL (ref 8.6–10.2)
Chloride: 102 mmol/L (ref 96–106)
Creatinine, Ser: 1.22 mg/dL (ref 0.76–1.27)
Globulin, Total: 2.7 g/dL (ref 1.5–4.5)
Glucose: 75 mg/dL (ref 70–99)
Potassium: 4.2 mmol/L (ref 3.5–5.2)
Sodium: 141 mmol/L (ref 134–144)
Total Protein: 7.3 g/dL (ref 6.0–8.5)
eGFR: 64 mL/min/1.73 (ref >59)

## 2023-10-19 LAB — CBC WITH DIFFERENTIAL/PLATELET
Basophils Absolute: 0 x10E3/uL (ref 0.0–0.2)
Basos: 1 %
EOS (ABSOLUTE): 0.1 x10E3/uL (ref 0.0–0.4)
Eos: 3 %
Hematocrit: 44.2 % (ref 37.5–51.0)
Hemoglobin: 13.3 g/dL (ref 13.0–17.7)
Immature Grans (Abs): 0 x10E3/uL (ref 0.0–0.1)
Immature Granulocytes: 0 %
Lymphocytes Absolute: 2 x10E3/uL (ref 0.7–3.1)
Lymphs: 43 %
MCH: 24.3 pg — ABNORMAL LOW (ref 26.6–33.0)
MCHC: 30.1 g/dL — ABNORMAL LOW (ref 31.5–35.7)
MCV: 81 fL (ref 79–97)
Monocytes Absolute: 0.4 x10E3/uL (ref 0.1–0.9)
Monocytes: 9 %
Neutrophils Absolute: 2.1 x10E3/uL (ref 1.4–7.0)
Neutrophils: 44 %
Platelets: 167 x10E3/uL (ref 150–450)
RBC: 5.47 x10E6/uL (ref 4.14–5.80)
RDW: 18.7 % — ABNORMAL HIGH (ref 11.6–15.4)
WBC: 4.6 x10E3/uL (ref 3.4–10.8)

## 2023-10-19 LAB — T4, FREE: Free T4: 0.86 ng/dL (ref 0.82–1.77)

## 2023-10-20 MED ORDER — LEVOTHYROXINE SODIUM 100 MCG PO TABS: 100.0000 ug | ORAL_TABLET | Freq: Every day | ORAL | 3 refills | Status: DC

## 2023-10-22 ENCOUNTER — Encounter: Payer: Self-pay | Admitting: Family Medicine

## 2023-10-22 NOTE — Assessment & Plan Note (Signed)
 Continue tamsulosin.

## 2023-10-22 NOTE — Assessment & Plan Note (Signed)
Continue omeprazole 40 mg daily and pepcid 40 mg at bedtime.

## 2023-10-22 NOTE — Assessment & Plan Note (Signed)
 Recommend increase levothyroxine  to 100 mcg once daily in am. Recheck thyroid  function in 6 weeks.

## 2023-11-03 ENCOUNTER — Ambulatory Visit (INDEPENDENT_AMBULATORY_CARE_PROVIDER_SITE_OTHER)

## 2023-11-03 DIAGNOSIS — E291 Testicular hypofunction: Secondary | ICD-10-CM

## 2023-11-03 MED ORDER — TESTOSTERONE CYPIONATE 200 MG/ML IM SOLN
200.0000 mg | Freq: Once | INTRAMUSCULAR | Status: AC
Start: 2023-11-03 — End: 2023-11-03
  Administered 2023-11-03: 200 mg via INTRAMUSCULAR

## 2023-11-03 NOTE — Progress Notes (Signed)
 Patient is in office today for a nurse visit for Testosterone  Injection. Patient Injection was given in the  Left upper quad. gluteus. Patient tolerated injection well.

## 2023-11-08 ENCOUNTER — Other Ambulatory Visit: Payer: Self-pay | Admitting: Family Medicine

## 2023-11-08 DIAGNOSIS — F5101 Primary insomnia: Secondary | ICD-10-CM

## 2023-11-17 ENCOUNTER — Ambulatory Visit

## 2023-11-29 ENCOUNTER — Other Ambulatory Visit: Payer: Self-pay

## 2023-11-29 DIAGNOSIS — E039 Hypothyroidism, unspecified: Secondary | ICD-10-CM

## 2023-12-01 ENCOUNTER — Other Ambulatory Visit

## 2023-12-01 DIAGNOSIS — E039 Hypothyroidism, unspecified: Secondary | ICD-10-CM

## 2023-12-02 LAB — TSH: TSH: 5.82 u[IU]/mL — ABNORMAL HIGH (ref 0.450–4.500)

## 2023-12-04 ENCOUNTER — Ambulatory Visit: Payer: Self-pay | Admitting: Family Medicine

## 2023-12-05 MED ORDER — LEVOTHYROXINE SODIUM 112 MCG PO TABS: 112.0000 ug | ORAL_TABLET | Freq: Every day | ORAL | 3 refills | Status: DC

## 2023-12-06 ENCOUNTER — Ambulatory Visit

## 2023-12-10 ENCOUNTER — Other Ambulatory Visit: Payer: Self-pay | Admitting: Family Medicine

## 2023-12-16 DIAGNOSIS — H2513 Age-related nuclear cataract, bilateral: Secondary | ICD-10-CM | POA: Diagnosis not present

## 2023-12-16 DIAGNOSIS — H4031X3 Glaucoma secondary to eye trauma, right eye, severe stage: Secondary | ICD-10-CM | POA: Diagnosis not present

## 2024-01-03 ENCOUNTER — Telehealth: Payer: Self-pay

## 2024-01-03 NOTE — Telephone Encounter (Signed)
 Called [facility] and patient has a coupon that he uses for this medication. He pays $18. They will ready to ship the medication. PA not need it.

## 2024-01-05 ENCOUNTER — Ambulatory Visit (INDEPENDENT_AMBULATORY_CARE_PROVIDER_SITE_OTHER)

## 2024-01-05 DIAGNOSIS — E291 Testicular hypofunction: Secondary | ICD-10-CM | POA: Diagnosis not present

## 2024-01-05 DIAGNOSIS — Z23 Encounter for immunization: Secondary | ICD-10-CM

## 2024-01-05 MED ORDER — TESTOSTERONE CYPIONATE 200 MG/ML IM SOLN
200.0000 mg | Freq: Once | INTRAMUSCULAR | Status: AC
  Administered 2024-01-05: 200 mg via INTRAMUSCULAR

## 2024-01-05 NOTE — Progress Notes (Signed)
 Patient presented for testosterone  injx. Injx administered IM left upper outer buttocks. See MAR for additional info. Patient also requested and given flu vaccine.

## 2024-01-23 NOTE — Progress Notes (Signed)
 Subjective:  Patient ID: Mark Mejia, male    DOB: May 01, 1955  Age: 68 y.o. MRN: [id number]  Chief Complaint  Patient presents with   Medical Management of Chronic Issues    HPI: Discussed the use of AI scribe software for clinical note transcription with the patient, who gave verbal consent to proceed.  History of Present Illness Mark Mejia is a 68 year old male who presents for a regular chronic visit and annual wellness visit.  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  Constitutional and systemic symptoms - No fevers, chills, earaches, sore throat, stuffy nose, cough, shortness of breath, chest pain, abdominal pain, bowel problems, nausea, vomiting, bladder issues, excessive thirst or hunger, joint pain, muscle pain, back pain, dizziness, headaches, depression, anxiety, or loss of interest in activities  Chronic disease management - Hypertension, hyperlipidemia, hypothyroidism, and glaucoma - Current medications include amlodipine  5 mg daily, aspirin  81 mg daily, Lunesta  2 mg nightly, famotidine  40 mg daily, omeprazole  40 mg daily, Flonase  nasal spray, furosemide  20 mg daily, metoprolol  tartrate 25 mg twice daily, rosuvastatin  20 mg daily, tamsulosin  0.4 mg twice daily, tizanidine  as needed, vitamin D  50,000 units weekly, nitroglycerin  as needed - Occasional  use of hydrocodone  for pain, prefers Tylenol  or tizanidine        01/24/2024   10:03 AM 04/19/2023   10:38 AM 12/01/2022    3:30 PM 10/18/2022   10:06 AM 05/06/2022   11:25 AM  Depression screen PHQ 2/9  Decreased Interest 0 0 0 0 0  Down, Depressed, Hopeless 0 0 0 0 0  PHQ - 2 Score 0 0 0 0 0  Altered sleeping  1 2 3    Tired, decreased energy  0 0 0   Change in appetite  0 0 0   Feeling bad or failure about yourself   0 0 0   Trouble concentrating  0 0 0   Moving slowly or fidgety/restless  0 0 0   Suicidal thoughts  0 0 0   PHQ-9 Score  1  2  3     Difficult doing work/chores  Not difficult at all Not difficult at all Not difficult at all      Data saved with a previous flowsheet row definition        01/24/2024   10:13 AM  Fall Risk   Falls in the past year? 0  Number falls in past yr: 0  Injury with Fall? 0  Risk for fall due to : No Fall Risks  Follow up Education provided    Patient Care Team: Sherre Clapper, MD as PCP - General (Internal Medicine) Bernie Lamar PARAS, MD as Consulting Physician (Cardiology) Misenheimer, Evalene, MD as Consulting Physician (Unknown Physician Specialty)   Review of Systems  Constitutional:  Positive for diaphoresis (night sweats sporadic.). Negative for chills, fatigue and fever.  HENT:  Negative for congestion, ear pain  and sore throat.   Respiratory:  Negative for cough and shortness of breath.   Cardiovascular:  Negative for chest pain.  Gastrointestinal:  Negative for abdominal pain, constipation, diarrhea, nausea and vomiting.  Endocrine: Negative for polydipsia, polyphagia and polyuria.  Genitourinary:  Negative for dysuria and frequency.  Musculoskeletal:  Positive for arthralgias. Negative for back pain and myalgias.  Neurological:  Negative for dizziness and headaches.  Psychiatric/Behavioral:  Negative for dysphoric mood. The patient is not nervous/anxious.        No dysphoria    Current Outpatient Medications on File  Prior to Visit  Medication Sig Dispense Refill   amLODipine  (NORVASC ) 5 MG tablet TAKE 1 TABLET EVERY DAY 90 tablet 3   aspirin  EC 81 MG tablet Take 81 mg by mouth daily.      ciclopirox  (PENLAC ) 8 % solution Apply topically at bedtime. Apply over nail and surrounding skin. Apply daily over previous coat. After seven (7) days, may remove with alcohol and continue cycle. 6.6 mL 0   dorzolamide-timolol (COSOPT) 22.3-6.8 MG/ML ophthalmic solution Place 1 drop into the right eye 2 (two) times daily.     eszopiclone  (LUNESTA ) 2 MG TABS tablet TAKE 1 TABLET AT BEDTIME AS NEEDED FOR SLEEP 90 tablet 1   famotidine  (PEPCID ) 40 MG tablet Take 40 mg by mouth daily.     fluticasone  (FLONASE ) 50 MCG/ACT nasal spray PLACE 2 SPRAYS INTO BOTH NOSTRILS DAILY. 32 g 5   furosemide  (LASIX ) 20 MG tablet TAKE 1 TABLET EVERY DAY 90 tablet 3   Glycerin-Hypromellose-PEG 400 ([facility] DRY EYE RELIEF OP) Place 2 drops into both eyes daily as needed (for dry eyes).     ketoconazole  (NIZORAL ) 2 % cream Apply 1 Application topically daily. 30 g 0   latanoprost (XALATAN) 0.005 % ophthalmic solution Place 1 drop into the right eye at bedtime.     levothyroxine  (SYNTHROID ) 112 MCG tablet Take 1 tablet (112 mcg total) by mouth daily. 90 tablet 3   meloxicam  (MOBIC ) 7.5 MG tablet TAKE 1 TABLET TWICE DAILY 180 tablet 1   metoprolol  tartrate (LOPRESSOR ) 25 MG tablet Take 1 tablet (25 mg total) by mouth 2 (two) times daily. 180 tablet 2   omeprazole  (PRILOSEC) 40 MG capsule TAKE 1 CAPSULE EVERY DAY 90 capsule 3   rosuvastatin  (CRESTOR ) 20 MG tablet TAKE 1 TABLET EVERY DAY 90 tablet 3   sildenafil  (VIAGRA ) 100 MG tablet TAKE 1/2 TO 1 TABLET EVERY DAY 30-60 MIN PRIOR TO INTERCOURSE AS NEEDED FOR ERECTILE DYSFUNCTION AS DIRECTED 6 tablet 11   tamsulosin  (FLOMAX ) 0.4 MG CAPS capsule TAKE 2 CAPSULES EVERY DAY 180 capsule 3   testosterone  cypionate (DEPOTESTOSTERONE CYPIONATE) 200 MG/ML injection INJECT 1ML (200MG ) AS DIRECTED EVERY 14 DAYS  (VIAL IS FOR SINGLE USE ONLY) 6 mL 1   tiZANidine  (ZANAFLEX ) 4 MG tablet TAKE 1 TABLET EVERY 8 HOURS AS NEEDED FOR MUSCLE SPASM(S) 90 tablet 11   Vitamin D , Ergocalciferol , (DRISDOL ) 1.25 MG (50000 UNIT) CAPS capsule Take 1 capsule (50,000 Units total) by mouth every 7 (seven) days. 12 capsule 3   No current facility-administered medications on file prior to visit.   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   Hypertrophic scar of skin 09/29/2016   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   Mixed hyperlipidemia    Peripheral vascular disease, asymptomatic 05/23/2017   80-99% stenosis in the left internal coronary artery   Precordial chest pain 12/26/2015   Shortness of breath 12/26/2015   Testicular hypofunction    Vitamin D  deficiency    Past Surgical History:  Procedure Laterality Date   GASTROSTOMY W/ FEEDING TUBE     LEFT HEART CATH AND CORONARY ANGIOGRAPHY N/A 05/25/2017   Procedure: LEFT HEART CATH AND CORONARY ANGIOGRAPHY;  Surgeon: Jordan, Peter M, MD;  Location: [facility];  Service: Cardiovascular;  Laterality: N/A;   PORTACATH PLACEMENT     portacath removal     THROAT SURGERY      Family History  Problem Relation Age of Onset   Arthritis Mother    Hypertension Mother    Hypertension Brother    Cancer - Other Maternal Grandmother    Social History   Socioeconomic History    Marital status: Married    Spouse name: LAURA   Number of children: 1   Years of education: 12 + 2   Highest education level: Not on file  Occupational History   Occupation: Retired  Tobacco Use   Smoking status: Never   Smokeless tobacco: Never  Vaping Use   Vaping status: Never Used  Substance and Sexual Activity   Alcohol use: No   Drug use: No   Sexual activity: Yes  Other Topics Concern   Not on file  Social History Narrative   Currently retired/disabled   Social Drivers of Health   Financial Resource Strain: Patient Declined (01/23/2024)   Overall Financial Resource Strain (CARDIA)    Difficulty of Paying Living Expenses: Patient declined  Food Insecurity: No Food Insecurity (01/24/2024)   Hunger Vital Sign    Worried About Running Out of Food in the Last Year: Never true    Ran Out of Food in the Last Year: Never true  Transportation Needs: Patient Declined (01/23/2024)   PRAPARE - Transportation    Lack of Transportation (Medical): Patient declined    Lack of Transportation (Non-Medical): Patient declined  Physical Activity: Insufficiently Active (01/24/2024)   Exercise Vital Sign    Days of Exercise per Week: 3 days    Minutes of Exercise per Session: 10 min  Stress: No Stress Concern Present (01/24/2024)   [employer] of Occupational Health - Occupational Stress Questionnaire    Feeling of Stress: Not at all  Social Connections: Socially Integrated (01/24/2024)   Social Connection and Isolation Panel    Frequency of Communication with Friends and Family: More than three times a week    Frequency of Social Gatherings with Friends and Family: More than three times a week    Attends Religious Services: More than 4 times per year    Active Member of [employer] or Organizations: Yes    Attends Banker Meetings: 1 to 4 times per year    Marital Status: Married    Objective:  BP 122/72 (BP Location: Left Arm, Patient Position: Sitting)   Pulse 61    Temp 97.8 F (36.6 C) (Temporal)   Ht  5' 10 (1.778 m)   Wt 203 lb (92.1 kg)   SpO2 99%   BMI 29.13 kg/m      01/24/2024   10:01 AM 01/24/2024    9:31 AM 10/18/2023    9:11 AM  BP/Weight  Systolic BP 122 122 112  Diastolic BP 72 72 68  Wt. (Lbs) 203 203 205  BMI 29.13 kg/m2 29.13 kg/m2 29.41 kg/m2    Physical Exam Vitals reviewed.  Constitutional:      Appearance: Normal appearance.  Neck:     Vascular: No carotid bruit.  Cardiovascular:     Rate and Rhythm: Normal rate and regular rhythm.     Heart sounds: Normal heart sounds.  Pulmonary:     Effort: Pulmonary effort is normal.     Breath sounds: Normal breath sounds. No wheezing, rhonchi or rales.  Abdominal:     General: Bowel sounds are normal.     Palpations: Abdomen is soft.     Tenderness: There is no abdominal tenderness.  Skin:    Comments: Scar tissue over left chest wall where port was. Nontender.   Neurological:     Mental Status: He is alert.  Psychiatric:        Mood and Affect: Mood normal.        Behavior: Behavior normal.         Lab Results  Component Value Date   WBC 3.3 (L) 01/24/2024   HGB 13.2 01/24/2024   HCT 41.2 01/24/2024   PLT 133 (L) 01/24/2024   GLUCOSE 79 01/24/2024   CHOL 84 (L) 01/24/2024   TRIG 61 01/24/2024   HDL 28 (L) 01/24/2024   LDLCALC 42 01/24/2024   ALT 25 01/24/2024   AST 34 01/24/2024   NA 138 01/24/2024   K 4.5 01/24/2024   CL 100 01/24/2024   CREATININE 0.98 01/24/2024   BUN 14 01/24/2024   CO2 27 01/24/2024   TSH 0.718 01/24/2024   INR 1.1 05/23/2017    Results for orders placed or performed in visit on 01/24/24  B12 and Folate Panel   Collection Time: 01/24/24 10:40 AM  Result Value Ref Range   Vitamin B-12 853 232 - 1,245 pg/mL   Folate 15.6 >3.0 ng/mL  CBC with Differential/Platelet   Collection Time: 01/24/24 10:40 AM  Result Value Ref Range   WBC 3.3 (L) 3.4 - 10.8 x10E3/uL   RBC 5.05 4.14 - 5.80 x10E6/uL   Hemoglobin 13.2 13.0 -  17.7 g/dL   Hematocrit 58.7 62.4 - 51.0 %   MCV 82 79 - 97 fL   MCH 26.1 (L) 26.6 - 33.0 pg   MCHC 32.0 31.5 - 35.7 g/dL   RDW 80.3 (H) 88.3 - 84.5 %   Platelets 133 (L) 150 - 450 x10E3/uL   Neutrophils 44 Not Estab. %   Lymphs 42 Not Estab. %   Monocytes 10 Not Estab. %   Eos 3 Not Estab. %   Basos 1 Not Estab. %   Neutrophils Absolute 1.5 1.4 - 7.0 x10E3/uL   Lymphocytes Absolute 1.4 0.7 - 3.1 x10E3/uL   Monocytes Absolute 0.3 0.1 - 0.9 x10E3/uL   EOS (ABSOLUTE) 0.1 0.0 - 0.4 x10E3/uL   Basophils Absolute 0.0 0.0 - 0.2 x10E3/uL   Immature Granulocytes 0 Not Estab. %   Immature Grans (Abs) 0.0 0.0 - 0.1 x10E3/uL  Comprehensive metabolic panel with GFR   Collection Time: 01/24/24 10:40 AM  Result Value Ref Range   Glucose 79 70 - 99  mg/dL   BUN 14 8 - 27 mg/dL   Creatinine, Ser 9.01 0.76 - 1.27 mg/dL   eGFR 83 >40 fO/fpw/8.26   BUN/Creatinine Ratio 14 10 - 24   Sodium 138 134 - 144 mmol/L   Potassium 4.5 3.5 - 5.2 mmol/L   Chloride 100 96 - 106 mmol/L   CO2 27 20 - 29 mmol/L   Calcium  9.6 8.6 - 10.2 mg/dL   Total Protein 7.3 6.0 - 8.5 g/dL   Albumin 4.4 3.9 - 4.9 g/dL   Globulin, Total 2.9 1.5 - 4.5 g/dL   Bilirubin Total 0.6 0.0 - 1.2 mg/dL   Alkaline Phosphatase 55 47 - 123 IU/L   AST 34 0 - 40 IU/L   ALT 25 0 - 44 IU/L  T4, free   Collection Time: 01/24/24 10:40 AM  Result Value Ref Range   Free T4 1.50 0.82 - 1.77 ng/dL  TSH   Collection Time: 01/24/24 10:40 AM  Result Value Ref Range   TSH 0.718 0.450 - 4.500 uIU/mL  Lipid panel   Collection Time: 01/24/24 10:40 AM  Result Value Ref Range   Cholesterol, Total 84 (L) 100 - 199 mg/dL   Triglycerides 61 0 - 149 mg/dL   HDL 28 (L) >60 mg/dL   VLDL Cholesterol Cal 14 5 - 40 mg/dL   LDL Chol Calc (NIH) 42 0 - 99 mg/dL   Chol/HDL Ratio 3.0 0.0 - 5.0 ratio  .  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 No recent episodes of chest pain or shortness of breath. No swelling unless prolonged standing. Nitroglycerin  expired, refill needed. - Refilled nitroglycerin . - Continue amlodipine  5 mg once daily. - Continue metoprolol  tartrate 25 mg twice daily.    Atrophy of thyroid  Managed with levothyroxine . Recent blood work needed to assess thyroid  function. Reports consistent medication adherence for the past four weeks. - Ordered thyroid  function tests. Orders:   T4, free   TSH  Mixed hyperlipidemia Managed with rosuvastatin . Blood work needed to assess cholesterol levels. - Ordered lipid panel. Orders:   Lipid panel  Male hypogonadism Continue on Testosterone  cypionate 200 mg every 14 days to  xyosted  Orders:   testosterone  cypionate (DEPOTESTOSTERONE CYPIONATE) injection 200 mg   Confusion Check labs Orders:   B12 and Folate Panel   CBC with Differential/Platelet   Comprehensive metabolic panel with GFR  BPH with elevated PSA and lower urinary tract symptoms Managed with tamsulosin . No new urinary symptoms reported. - Continue tamsulosin  0.4 mg twice daily.    Primary insomnia Managed with Lunesta . No new issues reported. - Continue Lunesta  2 mg at night.    Primary osteoarthritis of knee, unspecified laterality Osteoarthritis of the knee causing intermittent pain. Advised against ibuprofen or Aleve due to meloxicam  use. - Use Tylenol  for additional pain relief. - Apply ice pack for flare-ups.    Chronic midline low back pain without sciatica Managed with tizanidine  and Tylenol . Hydrocodone  use discouraged. Advised to use Tylenol  and tizanidine  for pain management. - Use Tylenol  for pain relief. - Use tizanidine  as needed for muscle relaxation. - Apply ice pack for flare-ups.     Body mass index is 29.13 kg/m.   Meds ordered this encounter  Medications   testosterone  cypionate (DEPOTESTOSTERONE  CYPIONATE) injection 200 mg   nitroGLYCERIN  (NITROSTAT ) 0.4 MG SL tablet    Sig: Place 1 tablet (  0.4 mg total) under the tongue every 5 (five) minutes as needed for chest pain.    Dispense:  25 tablet    Refill:  1    Orders Placed This Encounter  Procedures   B12 and Folate Panel   CBC with Differential/Platelet   Comprehensive metabolic panel with GFR   T4, free   TSH   Lipid panel     I,Marla I Leal-Borjas,acting as a scribe for Abigail Free, MD.,have documented all relevant documentation on the behalf of Abigail Free, MD,as directed by  Abigail Free, MD while in the presence of Abigail Free, MD.   Follow-up: Return in about 4 months (around 05/23/2024) for chronic follow up.  An After Visit Summary was printed and given to the patient.  Abigail Free, MD [facility] [phone]

## 2024-01-24 ENCOUNTER — Encounter: Payer: Self-pay | Admitting: Family Medicine

## 2024-01-24 ENCOUNTER — Ambulatory Visit (INDEPENDENT_AMBULATORY_CARE_PROVIDER_SITE_OTHER)

## 2024-01-24 ENCOUNTER — Ambulatory Visit (INDEPENDENT_AMBULATORY_CARE_PROVIDER_SITE_OTHER): Admitting: Family Medicine

## 2024-01-24 VITALS — BP 122/72 | HR 61 | Temp 97.8°F | Resp 16 | Ht 70.0 in | Wt 203.0 lb

## 2024-01-24 VITALS — BP 122/72 | HR 61 | Temp 97.8°F | Ht 70.0 in | Wt 203.0 lb

## 2024-01-24 DIAGNOSIS — Z0000 Encounter for general adult medical examination without abnormal findings: Secondary | ICD-10-CM

## 2024-01-24 DIAGNOSIS — M545 Low back pain, unspecified: Secondary | ICD-10-CM

## 2024-01-24 DIAGNOSIS — E782 Mixed hyperlipidemia: Secondary | ICD-10-CM | POA: Diagnosis not present

## 2024-01-24 DIAGNOSIS — I5083 High output heart failure: Secondary | ICD-10-CM | POA: Diagnosis not present

## 2024-01-24 DIAGNOSIS — E291 Testicular hypofunction: Secondary | ICD-10-CM | POA: Diagnosis not present

## 2024-01-24 DIAGNOSIS — K219 Gastro-esophageal reflux disease without esophagitis: Secondary | ICD-10-CM

## 2024-01-24 DIAGNOSIS — R972 Elevated prostate specific antigen [PSA]: Secondary | ICD-10-CM

## 2024-01-24 DIAGNOSIS — I130 Hypertensive heart and chronic kidney disease with heart failure and stage 1 through stage 4 chronic kidney disease, or unspecified chronic kidney disease: Secondary | ICD-10-CM

## 2024-01-24 DIAGNOSIS — F5101 Primary insomnia: Secondary | ICD-10-CM

## 2024-01-24 DIAGNOSIS — L910 Hypertrophic scar: Secondary | ICD-10-CM

## 2024-01-24 DIAGNOSIS — E034 Atrophy of thyroid (acquired): Secondary | ICD-10-CM

## 2024-01-24 DIAGNOSIS — Z23 Encounter for immunization: Secondary | ICD-10-CM | POA: Diagnosis not present

## 2024-01-24 DIAGNOSIS — R410 Disorientation, unspecified: Secondary | ICD-10-CM

## 2024-01-24 DIAGNOSIS — N401 Enlarged prostate with lower urinary tract symptoms: Secondary | ICD-10-CM

## 2024-01-24 DIAGNOSIS — G8929 Other chronic pain: Secondary | ICD-10-CM

## 2024-01-24 DIAGNOSIS — H4020X0 Unspecified primary angle-closure glaucoma, stage unspecified: Secondary | ICD-10-CM

## 2024-01-24 DIAGNOSIS — M1710 Unilateral primary osteoarthritis, unspecified knee: Secondary | ICD-10-CM | POA: Diagnosis not present

## 2024-01-24 MED ORDER — NITROGLYCERIN 0.4 MG SL SUBL: 0.4000 mg | SUBLINGUAL_TABLET | SUBLINGUAL | 1 refills | Status: DC | PRN

## 2024-01-24 MED ORDER — TESTOSTERONE CYPIONATE 200 MG/ML IM SOLN
200.0000 mg | Freq: Once | INTRAMUSCULAR | Status: AC
  Administered 2024-01-24: 200 mg via INTRAMUSCULAR

## 2024-01-24 NOTE — Assessment & Plan Note (Addendum)
 Managed with rosuvastatin . Blood work needed to assess cholesterol levels. - Ordered lipid panel. Orders:   Lipid panel

## 2024-01-24 NOTE — Progress Notes (Signed)
 Chief Complaint  Patient presents with   Annual Wellness Visit     Subjective:   Mark Mejia is a 68 y.o. male who presents for a Medicare Annual Wellness Visit.  Allergies (verified) Patient has no known allergies.   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   Hypertrophic scar of skin 09/29/2016   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   Mixed hyperlipidemia    Peripheral vascular disease, asymptomatic 05/23/2017   80-99% stenosis in the left internal coronary artery   Precordial chest pain 12/26/2015   Shortness of breath 12/26/2015   Testicular hypofunction    Vitamin D  deficiency    Past Surgical History:  Procedure Laterality Date   GASTROSTOMY W/ FEEDING TUBE     LEFT HEART CATH AND CORONARY ANGIOGRAPHY N/A 05/25/2017   Procedure: LEFT HEART CATH AND CORONARY ANGIOGRAPHY;  Surgeon: Jordan, Peter M, MD;  Location: [facility];  Service: Cardiovascular;  Laterality: N/A;   PORTACATH PLACEMENT     portacath removal     THROAT SURGERY     Family History  Problem Relation Age of Onset   Arthritis Mother    Hypertension Mother    Hypertension Brother    Cancer - Other Maternal Grandmother    Social History   Occupational History   Occupation: Retired  Tobacco Use   Smoking status: Never   Smokeless tobacco: Never  Vaping Use   Vaping status: Never Used   Substance and Sexual Activity   Alcohol use: No   Drug use: No   Sexual activity: Yes   Tobacco Counseling Counseling given: Not Answered  SDOH Screenings   Food Insecurity: No Food Insecurity (01/24/2024)  Housing: Unknown (01/24/2024)  Transportation Needs: Patient Declined (01/23/2024)  Utilities: Not At Risk (01/24/2024)  Alcohol Screen: Low Risk  (11/28/2022)  Depression (PHQ2-9): Low Risk  (01/24/2024)  Financial Resource Strain: Patient Declined (01/23/2024)  Physical Activity: Insufficiently Active (01/24/2024)  Social Connections: Socially Integrated (01/24/2024)  Stress: No Stress Concern Present (01/24/2024)  Tobacco Use: Low Risk  (01/24/2024)  Health Literacy: Adequate Health Literacy (01/24/2024)   See flowsheets for full screening details  Depression Screen PHQ 2 & 9 Depression Scale- Over the past 2 weeks, how often have you been bothered by any of the following problems? Little interest or pleasure in doing things: 0 Feeling down, depressed, or hopeless (PHQ Adolescent also includes...irritable): 0 PHQ-2 Total Score: 0 Trouble falling or staying asleep, or sleeping too much: 1 Feeling tired or having little energy: 0 Poor appetite or overeating (PHQ Adolescent also includes...weight loss): 0 Feeling bad about yourself - or that you are a failure or have let yourself or your family down: 0 Trouble concentrating on things, such as reading the newspaper or watching television (PHQ Adolescent also includes...like school work): 0 Moving or speaking so slowly that other people could have noticed. Or the opposite -  being so fidgety or restless that you have been moving around a lot more than usual: 0 Thoughts that you would be better off dead, or of hurting yourself in some way: 0 PHQ-9 Total Score: 1 If you checked off any problems, how difficult have these problems made it for you to do your work, take care of things at home, or get along with other people?: Not  difficult at all  Depression Treatment Depression Interventions/Treatment : EYV7-0 Score <4 Follow-up Not Indicated     Goals Addressed             This Visit's Progress    Track and Manage My Blood Pressure-Hypertension   On track    Timeframe:  Long-Range Goal Priority:  High Start Date:                             Expected End Date:                       Follow Up Date 12/25/21   - check blood pressure weekly - choose a place to take my blood pressure (home, clinic or office, retail store) - write blood pressure results in a log or diary    Why is this important?   You won't feel high blood pressure, but it can still hurt your blood vessels.  High blood pressure can cause heart or kidney problems. It can also cause a stroke.  Making lifestyle changes like losing a little weight or eating less salt will help.  Checking your blood pressure at home and at different times of the day can help to control blood pressure.  If the doctor prescribes medicine remember to take it the way the doctor ordered.  Call the office if you cannot afford the medicine or if there are questions about it.     Notes:        Visit info / Clinical Intake: Medicare Wellness Visit Type:: Subsequent Annual Wellness Visit Persons participating in visit:: patient Medicare Wellness Visit Mode:: In-person (required for WTM) Information given by:: patient Interpreter Needed?: No Pre-visit prep was completed: yes AWV questionnaire completed by patient prior to visit?: yes Date:: 01/23/24 Living arrangements:: lives with spouse/significant other; with family/others Patient's Overall Health Status Rating: good Typical amount of pain: none Does pain affect daily life?: (!) yes (cannot walk too much) Are you currently prescribed opioids?: no  Dietary Habits and Nutritional Risks How many meals a day?: 3 Eats fruit and vegetables daily?: yes Most meals are obtained by: preparing own meals; eating  out In the last 2 weeks, have you had any of the following?: none Diabetic:: no  Functional Status Activities of Daily Living (to include ambulation/medication): Independent Ambulation: Independent Medication Administration: Independent Home Management: Independent Manage your own finances?: yes Primary transportation is: driving Concerns about vision?: (!) yes (wears eyeglasses) Concerns about hearing?: no  Fall Screening Falls in the past year?: 0 Number of falls in past year: 0 Was there an injury with Fall?: 0 Fall Risk Category Calculator: 0 Patient Fall Risk Level: Low Fall Risk  Fall Risk Patient at Risk for Falls Due to: No Fall Risks Fall risk Follow up: Education provided  Home and Transportation Safety: All rugs have non-skid backing?: N/A, no rugs All stairs or steps have railings?: yes Grab bars in the bathtub or shower?: (!) no Have non-skid surface in bathtub or shower?: yes Good home lighting?:  yes Regular seat belt use?: yes Hospital stays in the last year:: no  Cognitive Assessment Difficulty concentrating, remembering, or making decisions? : no Will 6CIT or Mini Cog be Completed: yes What year is it?: 0 points What month is it?: 0 points About what time is it?: 0 points Count backwards from 20 to 1: 0 points Say the months of the year in reverse: 2 points Repeat the address phrase from earlier: 0 points 6 CIT Score: 2 points  Advance Directives (For Healthcare) Does Patient Have a Medical Advance Directive?: Yes Type of Advance Directive: Living will; Healthcare Power of Attorney Copy of Healthcare Power of Attorney in Chart?: No - copy requested Copy of Living Will in Chart?: No - copy requested  Reviewed/Updated  Reviewed/Updated: Reviewed All (Medical, Surgical, Family, Medications, Allergies, Care Teams, Patient Goals)        Objective:    Today's Vitals   01/24/24 1001  BP: 122/72  Pulse: 61  Resp: 16  Temp: 97.8 F (36.6 C)   SpO2: 99%  Weight: 203 lb (92.1 kg)  Height: 5' 10 (1.778 m)   Body mass index is 29.13 kg/m.  Current Medications (verified) Outpatient Encounter Medications as of 01/24/2024  Medication Sig   amLODipine  (NORVASC ) 5 MG tablet TAKE 1 TABLET EVERY DAY   aspirin  EC 81 MG tablet Take 81 mg by mouth daily.    ciclopirox  (PENLAC ) 8 % solution Apply topically at bedtime. Apply over nail and surrounding skin. Apply daily over previous coat. After seven (7) days, may remove with alcohol and continue cycle.   dorzolamide-timolol (COSOPT) 22.3-6.8 MG/ML ophthalmic solution Place 1 drop into the right eye 2 (two) times daily.   eszopiclone  (LUNESTA ) 2 MG TABS tablet TAKE 1 TABLET AT BEDTIME AS NEEDED FOR SLEEP   famotidine  (PEPCID ) 40 MG tablet Take 40 mg by mouth daily.   fluticasone  (FLONASE ) 50 MCG/ACT nasal spray PLACE 2 SPRAYS INTO BOTH NOSTRILS DAILY.   furosemide  (LASIX ) 20 MG tablet TAKE 1 TABLET EVERY DAY   Glycerin-Hypromellose-PEG 400 ([facility] DRY EYE RELIEF OP) Place 2 drops into both eyes daily as needed (for dry eyes).   ketoconazole  (NIZORAL ) 2 % cream Apply 1 Application topically daily.   latanoprost (XALATAN) 0.005 % ophthalmic solution Place 1 drop into the right eye at bedtime.   levothyroxine  (SYNTHROID ) 112 MCG tablet Take 1 tablet (112 mcg total) by mouth daily.   meloxicam  (MOBIC ) 7.5 MG tablet TAKE 1 TABLET TWICE DAILY   metoprolol  tartrate (LOPRESSOR ) 25 MG tablet Take 1 tablet (25 mg total) by mouth 2 (two) times daily.   nitroGLYCERIN  (NITROSTAT ) 0.4 MG SL tablet Place 1 tablet (0.4 mg total) under the tongue every 5 (five) minutes as needed for chest pain.   omeprazole  (PRILOSEC) 40 MG capsule TAKE 1 CAPSULE EVERY DAY   rosuvastatin  (CRESTOR ) 20 MG tablet TAKE 1 TABLET EVERY DAY   sildenafil  (VIAGRA ) 100 MG tablet TAKE 1/2 TO 1 TABLET EVERY DAY 30-60 MIN PRIOR TO INTERCOURSE AS NEEDED FOR ERECTILE DYSFUNCTION AS DIRECTED   tamsulosin  (FLOMAX ) 0.4 MG CAPS capsule TAKE 2  CAPSULES EVERY DAY   testosterone  cypionate (DEPOTESTOSTERONE CYPIONATE) 200 MG/ML injection INJECT 1ML (200MG ) AS DIRECTED EVERY 14 DAYS (VIAL IS FOR SINGLE USE ONLY)   tiZANidine  (ZANAFLEX ) 4 MG tablet TAKE 1 TABLET EVERY 8 HOURS AS NEEDED FOR MUSCLE SPASM(S)   Vitamin D , Ergocalciferol , (DRISDOL ) 1.25 MG (50000 UNIT) CAPS capsule Take 1 capsule (50,000 Units total) by mouth every 7 (seven) days.  No facility-administered encounter medications on file as of 01/24/2024.   Hearing/Vision screen No results found. Immunizations and Health Maintenance Health Maintenance  Topic Date Due   Zoster Vaccines- Shingrix (1 of 2) Never done   COVID-19 Vaccine (7 - 2025-26 season) 10/31/2023   DTaP/Tdap/Td (2 - Td or Tdap) 05/25/2024 (Originally 05/30/2021)   Medicare Annual Wellness (AWV)  01/23/2025   Colonoscopy  03/15/2028   Pneumococcal Vaccine: 50+ Years  Completed   Influenza Vaccine  Completed   Hepatitis C Screening  Completed   Meningococcal B Vaccine  Aged Out   Hepatitis B Vaccines 19-59 Average Risk  Discontinued        Assessment/Plan:  This is a routine wellness examination for Mark Mejia.  Patient Care Team: Sherre Clapper, MD as PCP - General (Internal Medicine) Bernie Lamar PARAS, MD as Consulting Physician (Cardiology) Misenheimer, Evalene, MD as Consulting Physician (Unknown Physician Specialty)  I have personally reviewed and noted the following in the patient's chart:   Medical and social history Use of alcohol, tobacco or illicit drugs  Current medications and supplements including opioid prescriptions. Functional ability and status Nutritional status Physical activity Advanced directives List of other physicians Hospitalizations, surgeries, and ER visits in previous 12 months Vitals Screenings to include cognitive, depression, and falls Referrals and appointments  No orders of the defined types were placed in this encounter.  In addition, I have reviewed and  discussed with patient certain preventive protocols, quality metrics, and best practice recommendations. A written personalized care plan for preventive services as well as general preventive health recommendations were provided to patient.   Mark Mejia, CMA   01/24/2024   Return in 1 year (on 01/23/2025).  After Visit Summary: (In Person-Printed) AVS printed and given to the patient  Nurse Notes: I spent 30 minutes with patient face to face. Covid shot was given to day. He will still working on his goal to keep his blood pressure in normal range.

## 2024-01-24 NOTE — Assessment & Plan Note (Addendum)
 Managed with levothyroxine . Recent blood work needed to assess thyroid  function. Reports consistent medication adherence for the past four weeks. - Ordered thyroid  function tests. Orders:   T4, free   TSH

## 2024-01-24 NOTE — Assessment & Plan Note (Addendum)
 Hypertension managed with amlodipine  and metoprolol . No recent episodes of chest pain or shortness of breath. No swelling unless prolonged standing. Nitroglycerin  expired, refill needed. - Refilled nitroglycerin . - Continue amlodipine  5 mg once daily. - Continue metoprolol  tartrate 25 mg twice daily.

## 2024-01-24 NOTE — Assessment & Plan Note (Addendum)
 Continue on Testosterone  cypionate 200 mg every 14 days to  xyosted  Orders:   testosterone  cypionate (DEPOTESTOSTERONE CYPIONATE) injection 200 mg

## 2024-01-24 NOTE — Patient Instructions (Signed)
  VISIT SUMMARY: Today, you had your regular chronic visit and annual wellness check-up. We discussed your recent symptoms, including weakness, dizziness, night sweats, and ocular discomfort, and reviewed your chronic conditions and medications.  YOUR PLAN: GENERALIZED WEAKNESS AND DIZZINESS: You experienced weakness and dizziness after skipping meals, which resolved after eating. -Ensure you eat regular meals to prevent these symptoms.  NIGHT SWEATS: You have been experiencing intermittent night sweats. -Monitor your symptoms and report any changes or if they become more frequent.  OCULAR DISCOMFORT: You have recurring intermittent eye discomfort in both eyes.  -Please call your eye doctor and get an acute appointment for this.   SHOULDER DISCOMFORT: You have discomfort at the site of a previous port placement for cancer treatment. -Use Tylenol  for pain relief as needed.  HYPERTENSION AND CHRONIC KIDNEY DISEASE WITH HEART FAILURE: Your hypertension is managed with amlodipine  and metoprolol . No recent episodes of chest pain or shortness of breath. -Refilled your nitroglycerin  prescription. -Continue taking amlodipine  5 mg once daily. -Continue taking metoprolol  tartrate 25 mg twice daily.  HYPOTHYROIDISM: Your hypothyroidism is managed with levothyroxine . Recent blood work is needed to assess thyroid  function. -Ordered thyroid  function tests.  MIXED HYPERLIPIDEMIA: Your hyperlipidemia is managed with rosuvastatin . Blood work is needed to assess cholesterol levels. -Ordered a lipid panel.  GLAUCOMA: Your glaucoma is managed with latanoprost drops. -Continue using latanoprost drops.  BENIGN PROSTATIC HYPERPLASIA WITH LOWER URINARY TRACT SYMPTOMS: Your condition is managed with tamsulosin . No new urinary symptoms reported. -Continue taking tamsulosin  0.4 mg twice daily.  INSOMNIA: Your insomnia is managed with Lunesta . No new issues reported. -Continue taking Lunesta  2 mg at  night.  OSTEOARTHRITIS OF KNEE: Your osteoarthritis causes intermittent knee pain. -Use Tylenol  for additional pain relief. -Apply an ice pack for flare-ups.  CHRONIC BACK PAIN: Your chronic back pain is managed with tizanidine  and Tylenol . -Use Tylenol  for pain relief. -Use tizanidine  as needed for muscle relaxation. -Apply an ice pack for flare-ups.  GASTROESOPHAGEAL REFLUX DISEASE: Your GERD is managed with famotidine  and omeprazole . No new symptoms reported. -Continue taking famotidine  40 mg once daily. -Continue taking omeprazole  40 mg once daily.  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 -Use Tylenol  for pain relief as needed.                      Contains text generated by [employer].                                 Contains text generated by [employer].

## 2024-01-24 NOTE — Patient Instructions (Signed)
 [name],  Thank you for taking the time for your Medicare Wellness Visit. I appreciate your continued commitment to your health goals. Please review the care plan we discussed, and feel free to reach out if I can assist you further.  Please note that Annual Wellness Visits do not include a physical exam. Some assessments may be limited, especially if the visit was conducted virtually. If needed, we may recommend an in-person follow-up with your provider.  Ongoing Care Seeing your primary care provider every 3 to 6 months helps us  monitor your health and provide consistent, personalized care. Next appointment in 3 months.  Referrals If a referral was made during today's visit and you haven't received any updates within two weeks, please contact the referred provider directly to check on the status.  Recommended Screenings:  Health Maintenance  Topic Date Due   Zoster (Shingles) Vaccine (1 of 2) Never done   COVID-19 Vaccine (7 - 2025-26 season) 10/31/2023   Medicare Annual Wellness Visit  12/01/2023   DTaP/Tdap/Td vaccine (2 - Td or Tdap) 05/25/2024*   Colon Cancer Screening  03/15/2028   Pneumococcal Vaccine for age over 69  Completed   Flu Shot  Completed   Hepatitis C Screening  Completed   Meningitis B Vaccine  Aged Out   Hepatitis B Vaccine  Discontinued  *Topic was postponed. The date shown is not the original due date.       10/01/2022    3:35 PM  Advanced Directives  Does Patient Have a Medical Advance Directive? Yes  Type of Advance Directive Living will  Does patient want to make changes to medical advance directive? No - Patient declined  Would patient like information on creating a medical advance directive? No - Guardian declined    Vision: Annual vision screenings are recommended for early detection of glaucoma, cataracts, and diabetic retinopathy. These exams can also reveal signs of chronic conditions such as diabetes and high blood pressure.  Dental: Annual  dental screenings help detect early signs of oral cancer, gum disease, and other conditions linked to overall health, including heart disease and diabetes.  Please see the attached documents for additional preventive care recommendations. Eating healthy and exerccise

## 2024-01-25 ENCOUNTER — Ambulatory Visit: Payer: Self-pay | Admitting: Family Medicine

## 2024-01-25 LAB — CBC WITH DIFFERENTIAL/PLATELET
Basophils Absolute: 0 x10E3/uL (ref 0.0–0.2)
Basos: 1 %
EOS (ABSOLUTE): 0.1 x10E3/uL (ref 0.0–0.4)
Eos: 3 %
Hematocrit: 41.2 % (ref 37.5–51.0)
Hemoglobin: 13.2 g/dL (ref 13.0–17.7)
Immature Grans (Abs): 0 x10E3/uL (ref 0.0–0.1)
Immature Granulocytes: 0 %
Lymphocytes Absolute: 1.4 x10E3/uL (ref 0.7–3.1)
Lymphs: 42 %
MCH: 26.1 pg — ABNORMAL LOW (ref 26.6–33.0)
MCHC: 32 g/dL (ref 31.5–35.7)
MCV: 82 fL (ref 79–97)
Monocytes Absolute: 0.3 x10E3/uL (ref 0.1–0.9)
Monocytes: 10 %
Neutrophils Absolute: 1.5 x10E3/uL (ref 1.4–7.0)
Neutrophils: 44 %
Platelets: 133 x10E3/uL — ABNORMAL LOW (ref 150–450)
RBC: 5.05 x10E6/uL (ref 4.14–5.80)
RDW: 19.6 % — ABNORMAL HIGH (ref 11.6–15.4)
WBC: 3.3 x10E3/uL — ABNORMAL LOW (ref 3.4–10.8)

## 2024-01-25 LAB — COMPREHENSIVE METABOLIC PANEL WITH GFR
ALT: 25 IU/L (ref 0–44)
AST: 34 IU/L (ref 0–40)
Albumin: 4.4 g/dL (ref 3.9–4.9)
Alkaline Phosphatase: 55 IU/L (ref 47–123)
BUN/Creatinine Ratio: 14 (ref 10–24)
BUN: 14 mg/dL (ref 8–27)
Bilirubin Total: 0.6 mg/dL (ref 0.0–1.2)
CO2: 27 mmol/L (ref 20–29)
Calcium: 9.6 mg/dL (ref 8.6–10.2)
Chloride: 100 mmol/L (ref 96–106)
Creatinine, Ser: 0.98 mg/dL (ref 0.76–1.27)
Globulin, Total: 2.9 g/dL (ref 1.5–4.5)
Glucose: 79 mg/dL (ref 70–99)
Potassium: 4.5 mmol/L (ref 3.5–5.2)
Sodium: 138 mmol/L (ref 134–144)
Total Protein: 7.3 g/dL (ref 6.0–8.5)
eGFR: 83 mL/min/1.73 (ref >59)

## 2024-01-25 LAB — B12 AND FOLATE PANEL
Folate: 15.6 ng/mL (ref >3.0)
Vitamin B-12: 853 pg/mL (ref 232–1245)

## 2024-01-25 LAB — LIPID PANEL
Chol/HDL Ratio: 3 ratio (ref 0.0–5.0)
Cholesterol, Total: 84 mg/dL — ABNORMAL LOW (ref 100–199)
HDL: 28 mg/dL — ABNORMAL LOW (ref >39)
LDL Chol Calc (NIH): 42 mg/dL (ref 0–99)
Triglycerides: 61 mg/dL (ref 0–149)
VLDL Cholesterol Cal: 14 mg/dL (ref 5–40)

## 2024-01-25 LAB — TSH: TSH: 0.718 u[IU]/mL (ref 0.450–4.500)

## 2024-01-25 LAB — T4, FREE: Free T4: 1.5 ng/dL (ref 0.82–1.77)

## 2024-01-27 DIAGNOSIS — R410 Disorientation, unspecified: Secondary | ICD-10-CM | POA: Insufficient documentation

## 2024-01-27 DIAGNOSIS — M1710 Unilateral primary osteoarthritis, unspecified knee: Secondary | ICD-10-CM | POA: Insufficient documentation

## 2024-01-27 DIAGNOSIS — H4020X0 Unspecified primary angle-closure glaucoma, stage unspecified: Secondary | ICD-10-CM | POA: Insufficient documentation

## 2024-01-27 DIAGNOSIS — R972 Elevated prostate specific antigen [PSA]: Secondary | ICD-10-CM | POA: Insufficient documentation

## 2024-01-27 DIAGNOSIS — Z0000 Encounter for general adult medical examination without abnormal findings: Secondary | ICD-10-CM | POA: Insufficient documentation

## 2024-01-27 NOTE — Assessment & Plan Note (Signed)
 Annual wellness visit conducted. No acute issues reported. - Conducted annual wellness visit with nurse.

## 2024-01-27 NOTE — Assessment & Plan Note (Signed)
 Osteoarthritis of the knee causing intermittent pain. Advised against ibuprofen or Aleve due to meloxicam  use. - Use Tylenol  for additional pain relief. - Apply ice pack for flare-ups.

## 2024-01-27 NOTE — Assessment & Plan Note (Addendum)
 Managed with Lunesta . No new issues reported. - Continue Lunesta  2 mg at night.

## 2024-01-27 NOTE — Assessment & Plan Note (Addendum)
 Check labs Orders:   B12 and Folate Panel   CBC with Differential/Platelet   Comprehensive metabolic panel with GFR

## 2024-01-27 NOTE — Assessment & Plan Note (Signed)
 Managed with tizanidine  and Tylenol . Hydrocodone  use discouraged. Advised to use Tylenol  and tizanidine  for pain management. - Use Tylenol  for pain relief. - Use tizanidine  as needed for muscle relaxation. - Apply ice pack for flare-ups.

## 2024-01-27 NOTE — Assessment & Plan Note (Deleted)
 Keloid scar at previous port site causing occasional tenderness. No current port in place.

## 2024-01-27 NOTE — Assessment & Plan Note (Signed)
 Managed with tamsulosin . No new urinary symptoms reported. - Continue tamsulosin  0.4 mg twice daily.

## 2024-01-27 NOTE — Assessment & Plan Note (Deleted)
 Managed with latanoprost drops. - Continue latanoprost drops.

## 2024-01-27 NOTE — Assessment & Plan Note (Signed)
 Managed with famotidine  and omeprazole . No new symptoms reported. - Continue famotidine  40 mg once daily. - Continue omeprazole  40 mg once daily.

## 2024-01-31 ENCOUNTER — Ambulatory Visit (HOSPITAL_BASED_OUTPATIENT_CLINIC_OR_DEPARTMENT_OTHER)
Admission: EM | Admit: 2024-01-31 | Discharge: 2024-01-31 | Disposition: A | Discharge status: Home / Self Care | Attending: Family Medicine | Admitting: Family Medicine

## 2024-01-31 ENCOUNTER — Ambulatory Visit (HOSPITAL_BASED_OUTPATIENT_CLINIC_OR_DEPARTMENT_OTHER): Admit: 2024-01-31 | Discharge: 2024-01-31 | Disposition: A | Admitting: Radiology

## 2024-01-31 ENCOUNTER — Other Ambulatory Visit: Payer: Self-pay | Admitting: Family Medicine

## 2024-01-31 ENCOUNTER — Encounter (HOSPITAL_BASED_OUTPATIENT_CLINIC_OR_DEPARTMENT_OTHER): Payer: Self-pay

## 2024-01-31 DIAGNOSIS — M779 Enthesopathy, unspecified: Secondary | ICD-10-CM | POA: Diagnosis not present

## 2024-01-31 DIAGNOSIS — X501XXA Overexertion from prolonged static or awkward postures, initial encounter: Secondary | ICD-10-CM

## 2024-01-31 DIAGNOSIS — M1812 Unilateral primary osteoarthritis of first carpometacarpal joint, left hand: Secondary | ICD-10-CM | POA: Diagnosis not present

## 2024-01-31 DIAGNOSIS — M79642 Pain in left hand: Secondary | ICD-10-CM

## 2024-01-31 DIAGNOSIS — S52612A Displaced fracture of left ulna styloid process, initial encounter for closed fracture: Secondary | ICD-10-CM | POA: Diagnosis not present

## 2024-01-31 NOTE — Discharge Instructions (Addendum)
 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  Your x-ray looked fine.  Splint for compression and stabilize the area so it does not move as much and cause more inflammation.  Recommend resting the hand is much as possible and icing the area few times a day You can take anti-inflammatory medication or Tylenol  for pain if needed.

## 2024-01-31 NOTE — ED Provider Notes (Signed)
 [name]    CSN: [id number] Arrival date & time: 01/31/24  1020      History   Chief Complaint Chief Complaint  Patient presents with   Hand Pain    HPI Mark Mejia is a 68 y.o. male.   Pt is a 68 year old that  presents with hand pain. Patient states while doing dishes last week, twisted washcloth and felt pain to left hand. Pin is to the left 5th metacarpal area.  States has felt the pain on a few other occasions when doing dishes. Points to top of left hand as to location of pain. Painful to make a fist.    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   Hypertrophic scar of skin 09/29/2016   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   Mixed hyperlipidemia    Peripheral vascular disease, asymptomatic 05/23/2017   80-99% stenosis in the left internal coronary artery   Precordial chest pain 12/26/2015   Shortness of breath 12/26/2015   Testicular hypofunction    Vitamin D  deficiency     Patient Active Problem List   Diagnosis Date Noted   Confusion 01/27/2024   Adult wellness visit 01/27/2024   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   BPH with elevated PSA and lower urinary tract symptoms 01/27/2024   Primary osteoarthritis of knee 01/27/2024   Bleeding from the nose 05/26/2023   Screening for prostate cancer 02/07/2023   Fever 02/04/2023   Mediastinal  lymphadenopathy 09/12/2022   Arterial hypotension 09/10/2022   Male erectile dysfunction, unspecified 10/30/2021   Seasonal allergic rhinitis due to pollen 08/09/2021   Insomnia 08/04/2021   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) 01/02/2021   Atherosclerosis of native artery of both lower extremities with intermittent claudication 01/02/2021   Snoring 11/26/2020   Somnolence, daytime 11/26/2020   Chronic midline low back pain without sciatica 11/26/2020   Other fatigue 11/26/2020   Need for immunization against influenza 11/26/2020   Overweight (BMI 25.0-29.9) 11/26/2020   CAD (coronary artery disease)CAD RAD 1    GERD (gastroesophageal reflux disease)    Hepatitis C    History of esophageal cancer    Testicular hypofunction    Vitamin D  deficiency    Mixed hyperlipidemia 06/14/2019   Atrophy of thyroid  06/14/2019   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   Benign prostatic hyperplasia with nocturia 06/14/2019   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   Other chest pain 05/25/2017   Peripheral vascular disease, unspecified 05/23/2017   Keloid scar 09/29/2016   Encounter for annual wellness exam in Medicare patient 03/10/2016   Encounter for adjustment and management of vascular access device 03/10/2016   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 Authorizing Provider  amLODipine  (NORVASC ) 5 MG tablet TAKE 1  TABLET EVERY DAY 08/03/23   Cox, Kirsten, MD  aspirin  EC 81 MG tablet Take 81 mg by mouth daily.     [provider]  ciclopirox  (PENLAC ) 8 % solution Apply topically at bedtime. Apply over nail and surrounding skin. Apply daily over previous coat. After seven (7) days, may remove with alcohol and continue cycle. 03/29/23   Lamount Ethan CROME, DPM  dorzolamide-timolol (COSOPT) 22.3-6.8 MG/ML ophthalmic solution Place 1 drop into the right eye 2 (two) times daily. 07/11/21   [provider]  eszopiclone  (LUNESTA ) 2 MG TABS tablet TAKE 1 TABLET AT BEDTIME AS NEEDED FOR SLEEP 11/09/23   Cox, Kirsten, MD  famotidine  (PEPCID ) 40 MG tablet Take 40 mg by mouth daily. 05/11/22   [provider]  fluticasone  (FLONASE ) 50 MCG/ACT nasal spray PLACE 2 SPRAYS INTO BOTH NOSTRILS DAILY. 07/28/23   Cox, Abigail, MD  furosemide  (LASIX ) 20 MG tablet TAKE 1 TABLET EVERY DAY 04/15/23   Cox, Abigail, MD  Glycerin-Hypromellose-PEG 400 ([facility] DRY EYE RELIEF OP) Place 2 drops into both eyes daily as needed (for dry eyes).    [provider]  ketoconazole  (NIZORAL ) 2 % cream Apply 1 Application topically daily. 12/28/22   Standiford, Marsa FALCON, DPM  latanoprost (XALATAN) 0.005 % ophthalmic solution Place 1 drop into the right eye at bedtime.    [provider]  levothyroxine  (SYNTHROID ) 112 MCG tablet Take 1 tablet (112 mcg total) by mouth daily. 12/05/23   Cox, Abigail, MD  meloxicam  (MOBIC ) 7.5 MG tablet TAKE 1 TABLET TWICE DAILY 09/26/23   Cox, Abigail, MD  metoprolol  tartrate (LOPRESSOR ) 25 MG tablet Take 1 tablet (25 mg total) by mouth 2 (two) times daily. 06/27/23   Krasowski, Robert J, MD  nitroGLYCERIN  (NITROSTAT ) 0.4 MG SL tablet Place 1 tablet (0.4 mg total) under the tongue every 5 (five) minutes as needed for chest pain. 01/24/24   Sherre Abigail, MD  omeprazole  (PRILOSEC) 40 MG capsule TAKE 1 CAPSULE EVERY DAY 05/12/23   Krasowski, Robert J, MD  rosuvastatin  (CRESTOR ) 20 MG tablet TAKE  1 TABLET EVERY DAY 05/12/23   Krasowski, Robert J, MD  sildenafil  (VIAGRA ) 100 MG tablet TAKE 1/2 TO 1 TABLET EVERY DAY 30-60 MIN PRIOR TO INTERCOURSE AS NEEDED FOR ERECTILE DYSFUNCTION AS DIRECTED 12/11/23   Cox, Abigail, MD  tamsulosin  (FLOMAX ) 0.4 MG CAPS capsule TAKE 2 CAPSULES EVERY DAY 08/03/23   Cox, Abigail, MD  testosterone  cypionate (DEPOTESTOSTERONE CYPIONATE) 200 MG/ML injection INJECT 1ML (200MG ) AS DIRECTED EVERY 14 DAYS (VIAL IS FOR SINGLE USE ONLY) 11/09/23   Cox, Kirsten, MD  tiZANidine  (ZANAFLEX ) 4 MG tablet TAKE 1 TABLET EVERY 8 HOURS AS NEEDED FOR MUSCLE SPASM(S) 11/09/23   Cox, Abigail, MD  Vitamin D , Ergocalciferol , (DRISDOL ) 1.25 MG (50000 UNIT) CAPS capsule Take 1 capsule (50,000 Units total) by mouth every 7 (seven) days. 11/09/23   Sherre Abigail, MD    Family History Family History  Problem Relation Age of Onset   Arthritis Mother  Hypertension Mother    Hypertension Brother    Cancer - Other Maternal Grandmother     Social History Social History   Tobacco Use   Smoking status: Never   Smokeless tobacco: Never  Vaping Use   Vaping status: Never Used  Substance Use Topics   Alcohol use: No   Drug use: No     Allergies   Patient has no known allergies.   Review of Systems Review of Systems See HPI  Physical Exam Triage Vital Signs ED Triage Vitals  Encounter Vitals Group     BP 01/31/24 1107 136/75     Girls Systolic BP Percentile --      Girls Diastolic BP Percentile --      Boys Systolic BP Percentile --      Boys Diastolic BP Percentile --      Pulse Rate 01/31/24 1107 63     Resp 01/31/24 1107 20     Temp 01/31/24 1107 97.7 F (36.5 C)     Temp Source 01/31/24 1107 Oral     SpO2 01/31/24 1107 96 %     Weight --      Height --      Head Circumference --      Peak Flow --      Pain Score 01/31/24 1109 7     Pain Loc --      Pain Education --      Exclude from Growth Chart --    No data found.  Updated Vital Signs BP 136/75 (BP  Location: Right Arm)   Pulse 63   Temp 97.7 F (36.5 C) (Oral)   Resp 20   SpO2 96%   Visual Acuity Right Eye Distance:   Left Eye Distance:   Bilateral Distance:    Right Eye Near:   Left Eye Near:    Bilateral Near:     Physical Exam Constitutional:      Appearance: Normal appearance.  Pulmonary:     Effort: Pulmonary effort is normal.  Musculoskeletal:        General: Tenderness present. No deformity or signs of injury. Normal range of motion.     Left hand: Tenderness and bony tenderness present. No swelling or deformity. Normal range of motion. Normal strength.     Comments: Pain to 5th metacarpal with making fist.   Neurological:     Mental Status: He is alert.  Psychiatric:        Mood and Affect: Mood normal.      UC Treatments / Results  Labs (all labs ordered are listed, but only abnormal results are displayed) Labs Reviewed - No data to display  EKG   Radiology No results found.  Procedures Procedures (including critical care time)  Medications Ordered in UC Medications - No data to display  Initial Impression / Assessment and Plan / UC Course  I have reviewed the triage vital signs and the nursing notes.  Pertinent labs & imaging results that were available during my care of the patient were reviewed by me and considered in my medical decision making (see chart for details).     Tendonitis- x ray done today with No evidence of acute fracture or dislocation. 2. Multifocal degenerative changes as described. 3. Old ulnar styloid fracture. 4. Nonspecific mild flexion at the 5th PIP joint. Correlate clinically for tendon injury. Pain does not correlate with ? Tendon injury.  Believe that he has tendonitis.  Splint for compression and stabilize the area  so it does not move as much and cause more inflammation.  Recommend resting the hand is much as possible and icing the area few times a day You can take anti-inflammatory medication or Tylenol  for  pain if needed.  Final Clinical Impressions(s) / UC Diagnoses   Final diagnoses:  Tendonitis     Discharge Instructions      I believe this is the nidus.  Your x-ray looked fine.  Splint for compression and stabilize the area so it does not move as much and cause more inflammation.  Recommend resting the hand is much as possible and icing the area few times a day You can take anti-inflammatory medication or Tylenol  for pain if needed.     ED Prescriptions   None    PDMP not reviewed this encounter.   Adah Wilbert LABOR, FNP 02/03/24 [phone]

## 2024-01-31 NOTE — ED Triage Notes (Signed)
 Patient states while doing dishes last week, twisted washcloth and felt pain to left hand. States has felt the pain on a few other occasions when doing dishes. Points to top of left hand as to location of pain. Painful to make a fist.

## 2024-02-01 ENCOUNTER — Ambulatory Visit (HOSPITAL_COMMUNITY): Payer: Self-pay

## 2024-02-06 NOTE — Telephone Encounter (Signed)
 Mark Mejia OFFERED HIM AN APPOINTMENT TODAY. HE WILL WAIT TIL HIS APPOINTMENT ON 12/23. DR. SHERRE

## 2024-02-08 ENCOUNTER — Other Ambulatory Visit: Payer: Self-pay | Admitting: Family Medicine

## 2024-02-08 ENCOUNTER — Other Ambulatory Visit: Payer: Self-pay | Admitting: Cardiology

## 2024-02-17 ENCOUNTER — Other Ambulatory Visit: Payer: Self-pay | Admitting: Family Medicine

## 2024-02-20 ENCOUNTER — Other Ambulatory Visit: Payer: Self-pay | Admitting: Family Medicine

## 2024-02-20 ENCOUNTER — Encounter: Payer: Self-pay | Admitting: Family Medicine

## 2024-02-20 DIAGNOSIS — D696 Thrombocytopenia, unspecified: Secondary | ICD-10-CM

## 2024-02-20 NOTE — Telephone Encounter (Signed)
 Patient scheduled an appointment for bloodwork for DR Ezzard and Dr Sherre before his appointments.  Copied from CRM [id number]. Topic: Clinical - Request for Lab/Test Order >> Feb 20, 2024 12:58 PM Winona R wrote: Pt calling to find out if additional lab orders were placed for ferritin and Iron/TIBC. Pt will call to resch if those labs are not added before the end of the day

## 2024-02-21 ENCOUNTER — Other Ambulatory Visit

## 2024-02-29 ENCOUNTER — Other Ambulatory Visit: Payer: Self-pay | Admitting: Cardiology

## 2024-03-05 ENCOUNTER — Encounter (HOSPITAL_BASED_OUTPATIENT_CLINIC_OR_DEPARTMENT_OTHER): Payer: Self-pay

## 2024-03-05 ENCOUNTER — Other Ambulatory Visit (HOSPITAL_BASED_OUTPATIENT_CLINIC_OR_DEPARTMENT_OTHER): Payer: Self-pay

## 2024-03-05 ENCOUNTER — Ambulatory Visit (HOSPITAL_BASED_OUTPATIENT_CLINIC_OR_DEPARTMENT_OTHER)
Admission: EM | Admit: 2024-03-05 | Discharge: 2024-03-05 | Disposition: A | Discharge status: Home / Self Care | Attending: Nurse Practitioner | Admitting: Nurse Practitioner

## 2024-03-05 ENCOUNTER — Other Ambulatory Visit (INDEPENDENT_AMBULATORY_CARE_PROVIDER_SITE_OTHER)

## 2024-03-05 DIAGNOSIS — D696 Thrombocytopenia, unspecified: Secondary | ICD-10-CM

## 2024-03-05 DIAGNOSIS — L089 Local infection of the skin and subcutaneous tissue, unspecified: Secondary | ICD-10-CM

## 2024-03-05 DIAGNOSIS — E291 Testicular hypofunction: Secondary | ICD-10-CM

## 2024-03-05 DIAGNOSIS — B353 Tinea pedis: Secondary | ICD-10-CM | POA: Diagnosis not present

## 2024-03-05 MED ORDER — TESTOSTERONE CYPIONATE 200 MG/ML IM SOLN
200.0000 mg | Freq: Once | INTRAMUSCULAR | Status: AC
  Administered 2024-03-05: 200 mg via INTRAMUSCULAR

## 2024-03-05 MED ORDER — CLOTRIMAZOLE 1 % EX CREA
TOPICAL_CREAM | CUTANEOUS | 0 refills | Status: AC
  Filled 2024-03-05: qty 45, 23d supply, fill #0

## 2024-03-05 MED ORDER — CEPHALEXIN 500 MG PO CAPS
500.0000 mg | ORAL_CAPSULE | Freq: Three times a day (TID) | ORAL | 0 refills | Status: AC
  Filled 2024-03-05: qty 15, 5d supply, fill #0

## 2024-03-05 NOTE — Discharge Instructions (Addendum)
 You were seen today for a bacterial infection of the left big toe and a separate fungal infection (athletes foot) between the toes on your right foot. The toe infection likely started after a small scratch and worsened because moisture from ointment and bandaging allowed bacteria to grow. The skin between the toes on your right foot shows signs of a fungal infection related to moisture and sweating.  Take the antibiotic exactly as prescribed until it is finished, even if the toe starts to look better. Keep the left big toe clean and dry, and do not apply ointments or creams to the infected area. You may loosely cover the toe with gauze while wearing socks or shoes, but leave it open to the air when you are at home. Wearing breathable footwear and changing socks if they become damp can help healing. For the athletes foot, dry carefully between the toes after bathing and apply the antifungal medication as directed.  Follow up with your primary care provider if the redness, swelling, or drainage does not improve within a few days or if the infection returns. A referral to a podiatrist may be needed if infections become recurrent or difficult to treat. Go to the emergency department right away if you develop spreading redness, worsening pain or swelling, pus-like drainage, red streaks up the foot or leg, fever, chills, or any signs that the infection is getting worse or spreading.

## 2024-03-05 NOTE — ED Triage Notes (Signed)
 Pt states he had a scratch on his left great toe that started around 1 month ago. The lesion scabbed over then he started to apply triple abx ointment and covering it with a bandage. The scab came off then he started to have drainage from the lesion. He has continued to apply the triple abx oint and calamine lotion to the lesion but has not been keeping it covered. Yesterday he started to notice a lesion in between his right great toe and second toe, the corner of his left eye, and above his left eyebrow. The areas are sore.

## 2024-03-05 NOTE — ED Provider Notes (Signed)
 " [name]    CSN: [id number] Arrival date & time: 03/05/24  1229      History   Chief Complaint Chief Complaint  Patient presents with   Wound Infection    HPI Mark Mejia is a 69 y.o. male.   Discussed the use of AI scribe software for clinical note transcription with the patient, who gave verbal consent to proceed.   Mark Mejia presents with a scratch on his left big toe that occurred approximately one month ago and has subsequently developed signs of infection. The initial scratch was healing and had scabbed over when his wife suggested applying triple antibiotic ointment and covering it with a bandage. The following night after removing the bandage to shower, the patient noticed the wound had worsened fluid drainage. He then applied Calamine lotion to the area. The patient describes occasional pin-prick sensations to the front of his toe and mild pain that is not unbearable. No itching. Yesterday morning, despite some improvement with the Calamine lotion, his wife noticed a new sore developing, prompting today's visit.  The patient reports clear drainage from the wound that soaked through his socks this morning. There is also some redness to the toe. He also reports an area between the great and 2nd toe of the left foot that looks abnormal. No itching, pain, redness or discomfort to that area. He is unsure if the two areas are related. The patient wears socks with his shoes and reports that his feet do sweat. He denies any fever, chills, body aches, or other systemic symptoms. The patient is not diabetic.  The following sections of the patient's history were reviewed and updated as appropriate: allergies, current medications, past family history, past medical history, past social history, past surgical history, and problem list.     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   Hypertrophic scar of skin 09/29/2016   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   Mixed hyperlipidemia    Peripheral vascular disease, asymptomatic 05/23/2017   80-99% stenosis in the left internal coronary artery   Precordial chest pain 12/26/2015   Shortness of breath 12/26/2015   Testicular hypofunction    Vitamin D  deficiency     Patient Active Problem List   Diagnosis Date Noted   Confusion 01/27/2024   Adult wellness visit 01/27/2024   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   BPH with elevated PSA and lower urinary tract symptoms 01/27/2024   Primary osteoarthritis of knee 01/27/2024   Bleeding from the nose 05/26/2023   Screening for prostate cancer 02/07/2023   Fever 02/04/2023   Mediastinal lymphadenopathy 09/12/2022   Arterial hypotension 09/10/2022   Male erectile dysfunction, unspecified 10/30/2021   Seasonal allergic rhinitis due to pollen 08/09/2021   Insomnia 08/04/2021   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) 01/02/2021   Atherosclerosis of native artery of both lower extremities with intermittent claudication  01/02/2021   Snoring 11/26/2020   Somnolence, daytime 11/26/2020   Chronic midline low back pain without sciatica 11/26/2020   Other fatigue 11/26/2020   Need for immunization against influenza 11/26/2020   Overweight (BMI 25.0-29.9) 11/26/2020   CAD (coronary artery disease)CAD RAD 1    GERD (gastroesophageal reflux disease)    Hepatitis C     History of esophageal cancer    Testicular hypofunction    Vitamin D  deficiency    Mixed hyperlipidemia 06/14/2019   Atrophy of thyroid  06/14/2019   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   Benign prostatic hyperplasia with nocturia 06/14/2019   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   Other chest pain 05/25/2017   Peripheral vascular disease, unspecified 05/23/2017   Keloid scar 09/29/2016   Encounter for annual wellness exam in Medicare patient 03/10/2016   Encounter for adjustment and management of vascular access device 03/10/2016   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 Authorizing Provider  cephALEXin  (KEFLEX ) 500 MG capsule Take 1 capsule (500 mg total) by mouth 3 (three) times daily for 5 days. 03/05/24 03/10/24 Yes Iola Lukes, FNP  clotrimazole  (LOTRIMIN ) 1 % cream Apply thin layer to affected area between the right great (big) and second toes two times a day until healed which can take up to 2 weeks 03/05/24  Yes Iola Lukes, FNP  amLODipine  (NORVASC ) 5 MG tablet TAKE 1 TABLET EVERY DAY 08/03/23   Sherre Clapper, MD  aspirin  EC 81 MG tablet Take 81 mg by mouth daily.     [provider]  ciclopirox  (PENLAC ) 8 % solution Apply topically at bedtime. Apply over nail and surrounding skin. Apply daily over previous coat. After seven (7) days, may remove with alcohol and continue cycle. 03/29/23   Lamount Ethan CROME, DPM   dorzolamide-timolol (COSOPT) 22.3-6.8 MG/ML ophthalmic solution Place 1 drop into the right eye 2 (two) times daily. 07/11/21   [provider]  eszopiclone  (LUNESTA ) 2 MG TABS tablet TAKE 1 TABLET AT BEDTIME AS NEEDED FOR SLEEP 11/09/23   Cox, Kirsten, MD  famotidine  (PEPCID ) 40 MG tablet Take 40 mg by mouth daily. 05/11/22   [provider]  fluticasone  (FLONASE ) 50 MCG/ACT nasal spray PLACE 2 SPRAYS INTO BOTH NOSTRILS DAILY. 07/28/23   Cox, Clapper, MD  furosemide  (LASIX ) 20 MG tablet TAKE 1 TABLET EVERY DAY 02/08/24   Cox, Clapper, MD  Glycerin-Hypromellose-PEG 400 ([facility] DRY EYE RELIEF OP) Place 2 drops into both eyes daily as needed (for dry eyes).    [provider]  ketoconazole  (NIZORAL ) 2 % cream Apply 1 Application topically daily. 12/28/22   Standiford, Marsa FALCON, DPM  latanoprost (XALATAN) 0.005 % ophthalmic solution Place 1 drop into the right eye at bedtime.    [provider]  levothyroxine  (SYNTHROID ) 112 MCG tablet Take 1 tablet (112 mcg total) by mouth daily. 12/05/23   Sherre Clapper, MD  meloxicam  (MOBIC ) 7.5 MG tablet TAKE 1 TABLET TWICE DAILY 02/17/24   Cox, Kirsten, MD  metoprolol  tartrate (LOPRESSOR ) 25 MG tablet Take 1 tablet (25 mg total) by mouth 2 (two) times daily. 02/08/24   Krasowski, Robert J, MD  nitroGLYCERIN  (NITROSTAT ) 0.4 MG SL tablet DISSOLVE 1 TABLET UNDER THE TONGUE EVERY 5 MINUTES AS NEEDED FOR CHEST PAIN 02/08/24   Cox, Abigail, MD  omeprazole  (PRILOSEC) 40 MG capsule TAKE 1 CAPSULE EVERY DAY 05/12/23   Krasowski, Robert J, MD  rosuvastatin  (CRESTOR ) 20 MG tablet TAKE 1 TABLET EVERY DAY 05/12/23   Krasowski, Robert J, MD  sildenafil  (VIAGRA ) 100 MG tablet TAKE 1/2 TO 1 TABLET EVERY DAY 30-60 MIN PRIOR TO INTERCOURSE AS NEEDED FOR ERECTILE DYSFUNCTION AS DIRECTED 12/11/23   Cox, Abigail, MD  tamsulosin  (FLOMAX ) 0.4 MG CAPS capsule TAKE 2 CAPSULES EVERY DAY 08/03/23   Cox, Abigail, MD  testosterone  cypionate (DEPOTESTOSTERONE  CYPIONATE) 200 MG/ML injection INJECT 1ML (200MG ) AS DIRECTED EVERY 14 DAYS (VIAL IS FOR SINGLE USE ONLY) 11/09/23   Cox, Kirsten, MD  tiZANidine  (ZANAFLEX ) 4 MG tablet TAKE 1 TABLET EVERY 8 HOURS AS NEEDED FOR MUSCLE SPASM(S) 11/09/23   Cox, Abigail, MD  Vitamin D , Ergocalciferol , (DRISDOL ) 1.25 MG (50000 UNIT) CAPS capsule Take 1 capsule (50,000 Units total) by mouth every 7 (seven) days. 11/09/23   CoxAbigail, MD    Family History Family History  Problem Relation Age of Onset   Arthritis Mother    Hypertension Mother    Hypertension Brother    Cancer - Other Maternal Grandmother     Social History Social History[1]   Allergies   Patient has no known allergies.   Review of Systems Review of Systems  Constitutional:  Negative for fever.  Musculoskeletal:  Negative for gait problem.  Skin:  Positive for wound.  All other systems reviewed and are negative.    Physical Exam Triage Vital Signs ED Triage Vitals  Encounter Vitals Group     BP 03/05/24 1411 (!) 157/72     Girls Systolic BP Percentile --      Girls Diastolic BP Percentile --      Boys Systolic BP Percentile --      Boys Diastolic BP Percentile --      Pulse Rate 03/05/24 1411 60     Resp 03/05/24 1411 20     Temp 03/05/24 1411 (!) 97.5 F (36.4 C)     Temp Source 03/05/24 1411 Oral     SpO2 03/05/24 1411 97 %     Weight --      Height --      Head Circumference --      Peak Flow --      Pain Score 03/05/24 1409 4     Pain Loc --      Pain Education --      Exclude from Growth Chart --    No data found.  Updated Vital Signs BP (!) 157/72 (BP Location: Right Arm)   Pulse 60   Temp (!) 97.5 F (36.4 C) (Oral)   Resp 20   SpO2 97%   Visual Acuity Right Eye Distance:   Left Eye Distance:   Bilateral Distance:    Right Eye Near:   Left Eye Near:    Bilateral Near:     Physical Exam Vitals reviewed.  Constitutional:      General: He is awake. He is not in acute distress.    Appearance:  Normal appearance.  He is well-developed. He is not ill-appearing, toxic-appearing or diaphoretic.  HENT:     Head: Normocephalic.     Right Ear: Hearing normal.     Left Ear: Hearing normal.     Nose: Nose normal.     Mouth/Throat:     Mouth: Mucous membranes are moist.  Eyes:     General: Vision grossly intact.     Conjunctiva/sclera: Conjunctivae normal.  Cardiovascular:     Rate and Rhythm: Normal rate and regular rhythm.     Heart sounds: Normal heart sounds.  Pulmonary:     Effort: Pulmonary effort is normal.     Breath sounds: Normal breath sounds and air entry.  Musculoskeletal:        General: Normal range of motion.     Cervical back: Normal range of motion and neck supple.  Skin:    General: Skin is warm and dry.     Findings: Wound present.     Comments: See below   Neurological:     General: No focal deficit present.     Mental Status: He is alert and oriented to person, place, and time.  Psychiatric:        Speech: Speech normal.        Behavior: Behavior is cooperative.      Right great toe (posterior view) -localized skin irritation and superficial breakdown along the plantar posterior junction.  No open ulceration, active bleeding, drainage or foreign body noted.  Surrounding skin intact without erythema, warmth, erythema or streaking.  No fluctuance noted.    Right great toe -superficial maceration and dry scaling along the interdigital crease adjacent to the right great toe.  The skin appears slightly moist with areas of superficial fissuring.  Surrounding skin intact.    Right great toe - localized erythema with mild maceration and superficial fissuring within the interdigital space between the right great toe and second toe.  The skin appears moist with areas of whitish scaling.  No open ulceration, active bleeding, purulent drainage or necrotic tissue noted.  No swelling, fluctuance or streaking.  Surrounding skin intact.  No malodor.     Left great  toe - diffuse erythema and mild edema along the distal left great toe and periungual region.  The skin appears dry with scabbing noted.  No open areas, ulceration, drainage or fluctuance noted.  No visible streaking.  The toenail is intact and mildly discolored.  UC Treatments / Results  Labs (all labs ordered are listed, but only abnormal results are displayed) Labs Reviewed - No data to display  EKG   Radiology No results found.  Procedures Procedures (including critical care time)  Medications Ordered in UC Medications - No data to display  Initial Impression / Assessment and Plan / UC Course  I have reviewed the triage vital signs and the nursing notes.  Pertinent labs & imaging results that were available during my care of the patient were reviewed by me and considered in my medical decision making (see chart for details).     The patient presents with a bacterial infection of the left great toe that developed after a scratch approximately one month ago and worsened with the use of triple antibiotic ointment and occlusive bandaging, which likely promoted bacterial growth by creating a moist environment. The patient noted clear drainage on the sock this morning. Examination shows diffuse erythema and mild edema of the distal left great toe and periungual region without abscess formation. The patient is not  diabetic and denies fever, chills, body aches, or other systemic symptoms, indicating a localized skin infection. In addition, the patient has findings consistent with interdigital tinea pedis of the right foot, evidenced by a white, macerated appearance between the toes, likely related to moisture from sweating and inadequate drying after bathing. This appears to be a separate condition from the toe infection.  Treatment includes initiation of cephalexin  three times daily for five days for the bacterial infection. The patient was advised to keep the affected toe clean and dry,  avoid applying ointments to the infected area, and to use gauze coverage only while wearing socks, leaving the area open to air when at home. A topical antifungal medication was prescribed for the interdigital athletes foot, to be applied after thoroughly drying between the toes. The patient was instructed on proper foot hygiene and moisture control. Follow-up with a primary care provider is recommended if symptoms do not improve or worsen. The patient was advised to seek emergency care for spreading redness, increasing pain or swelling, fever, drainage that becomes purulent, red streaking, or any signs of systemic infection.  Today's evaluation has revealed no signs of a dangerous process. Discussed diagnosis with patient and/or guardian. Patient and/or guardian aware of their diagnosis, possible red flag symptoms to watch out for and need for close follow up. Patient and/or guardian understands verbal and written discharge instructions. Patient and/or guardian comfortable with plan and disposition.  Patient and/or guardian has a clear mental status at this time, good insight into illness (after discussion and teaching) and has clear judgment to make decisions regarding their care  Documentation was completed with the aid of voice recognition software. Transcription may contain typographical errors.  Final Clinical Impressions(s) / UC Diagnoses   Final diagnoses:  Infection of great toe  Tinea pedis, right     Discharge Instructions      You were seen today for a bacterial infection of the left big toe and a separate fungal infection (athletes foot) between the toes on your right foot. The toe infection likely started after a small scratch and worsened because moisture from ointment and bandaging allowed bacteria to grow. The skin between the toes on your right foot shows signs of a fungal infection related to moisture and sweating.  Take the antibiotic exactly as prescribed until it is  finished, even if the toe starts to look better. Keep the left big toe clean and dry, and do not apply ointments or creams to the infected area. You may loosely cover the toe with gauze while wearing socks or shoes, but leave it open to the air when you are at home. Wearing breathable footwear and changing socks if they become damp can help healing. For the athletes foot, dry carefully between the toes after bathing and apply the antifungal medication as directed.  Follow up with your primary care provider if the redness, swelling, or drainage does not improve within a few days or if the infection returns. A referral to a podiatrist may be needed if infections become recurrent or difficult to treat. Go to the emergency department right away if you develop spreading redness, worsening pain or swelling, pus-like drainage, red streaks up the foot or leg, fever, chills, or any signs that the infection is getting worse or spreading.      ED Prescriptions     Medication Sig Dispense Auth. Provider   clotrimazole  (LOTRIMIN ) 1 % cream Apply thin layer to affected area between the right great (big) and  second toes two times a day until healed which can take up to 2 weeks 60 g Iola Lukes, FNP   cephALEXin  (KEFLEX ) 500 MG capsule Take 1 capsule (500 mg total) by mouth 3 (three) times daily for 5 days. 15 capsule Iola Lukes, FNP      PDMP not reviewed this encounter.     [1]  Social History Tobacco Use   Smoking status: Never   Smokeless tobacco: Never  Vaping Use   Vaping status: Never Used  Substance Use Topics   Alcohol use: No   Drug use: No     Iola Lukes, FNP 03/05/24 1531  "

## 2024-03-06 LAB — CBC WITH DIFFERENTIAL/PLATELET
Basophils Absolute: 0 x10E3/uL (ref 0.0–0.2)
Basos: 1 %
EOS (ABSOLUTE): 0.2 x10E3/uL (ref 0.0–0.4)
Eos: 4 %
Hematocrit: 39.2 % (ref 37.5–51.0)
Hemoglobin: 12.3 g/dL — ABNORMAL LOW (ref 13.0–17.7)
Immature Grans (Abs): 0 x10E3/uL (ref 0.0–0.1)
Immature Granulocytes: 0 %
Lymphocytes Absolute: 1.6 x10E3/uL (ref 0.7–3.1)
Lymphs: 40 %
MCH: 26.6 pg (ref 26.6–33.0)
MCHC: 31.4 g/dL — ABNORMAL LOW (ref 31.5–35.7)
MCV: 85 fL (ref 79–97)
Monocytes Absolute: 0.3 x10E3/uL (ref 0.1–0.9)
Monocytes: 7 %
Neutrophils Absolute: 2 x10E3/uL (ref 1.4–7.0)
Neutrophils: 48 %
Platelets: 152 x10E3/uL (ref 150–450)
RBC: 4.63 x10E6/uL (ref 4.14–5.80)
RDW: 17.4 % — ABNORMAL HIGH (ref 11.6–15.4)
WBC: 4.1 x10E3/uL (ref 3.4–10.8)

## 2024-03-06 LAB — IRON,TIBC AND FERRITIN PANEL
Ferritin: 91 ng/mL (ref 30–400)
Iron Saturation: 30 % (ref 15–55)
Iron: 100 ug/dL (ref 38–169)
Total Iron Binding Capacity: 329 ug/dL (ref 250–450)
UIBC: 229 ug/dL (ref 111–343)

## 2024-03-07 ENCOUNTER — Ambulatory Visit: Payer: Self-pay | Admitting: Family Medicine

## 2024-03-19 ENCOUNTER — Ambulatory Visit

## 2024-03-19 ENCOUNTER — Other Ambulatory Visit: Payer: Self-pay | Admitting: Family Medicine

## 2024-03-19 DIAGNOSIS — F5101 Primary insomnia: Secondary | ICD-10-CM

## 2024-03-22 ENCOUNTER — Other Ambulatory Visit: Payer: Self-pay

## 2024-03-22 MED ORDER — LEVOTHYROXINE SODIUM 112 MCG PO TABS: 112.0000 ug | ORAL_TABLET | Freq: Every day | ORAL | 0 refills | Status: DC

## 2024-03-28 ENCOUNTER — Other Ambulatory Visit: Payer: Self-pay

## 2024-03-28 MED ORDER — LEVOTHYROXINE SODIUM 112 MCG PO TABS: 112.0000 ug | ORAL_TABLET | Freq: Every day | ORAL | 0 refills | Status: AC

## 2024-04-06 ENCOUNTER — Other Ambulatory Visit (HOSPITAL_BASED_OUTPATIENT_CLINIC_OR_DEPARTMENT_OTHER): Payer: Self-pay

## 2024-04-06 ENCOUNTER — Ambulatory Visit (HOSPITAL_BASED_OUTPATIENT_CLINIC_OR_DEPARTMENT_OTHER)
Admission: RE | Admit: 2024-04-06 | Discharge: 2024-04-06 | Disposition: A | Discharge status: Home / Self Care | Source: Ambulatory Visit

## 2024-04-06 ENCOUNTER — Encounter (HOSPITAL_BASED_OUTPATIENT_CLINIC_OR_DEPARTMENT_OTHER): Payer: Self-pay

## 2024-04-06 VITALS — BP 129/74 | HR 53 | Temp 97.9°F | Resp 20

## 2024-04-06 DIAGNOSIS — B353 Tinea pedis: Secondary | ICD-10-CM

## 2024-04-06 MED ORDER — TERBINAFINE HCL 1 % EX CREA
1.0000 | TOPICAL_CREAM | Freq: Two times a day (BID) | CUTANEOUS | 2 refills | Status: AC
  Filled 2024-04-06: qty 30, 15d supply, fill #0

## 2024-04-06 NOTE — Discharge Instructions (Addendum)
 Apply antifungal cream twice a day for the next 2 weeks Use topical hydrocortisone to area once a day for 3 days.  Schedule to see your podiatrist for recheck

## 2024-04-06 NOTE — ED Triage Notes (Signed)
 Pt c/o bilateral foot pain/infection for the last 2 months. He was seen on 03/05/24 and was prescribed cephalexin  and clotrimazole  cream. At that time it was mostly on his left foot and he thought it was getting better but now his right foot is more effected that before. He states when it spreads he gets fluid filled blisters that he pops and applies alcohol to trying to prevent it from spreading further.

## 2024-05-23 ENCOUNTER — Ambulatory Visit: Admitting: Family Medicine
